# Patient Record
Sex: Female | Born: 1950 | Race: Black or African American | Hispanic: No | Smoking: Never smoker
Health system: Southern US, Community
[De-identification: ages and names within clinical notes are randomized; demographics above are authoritative.]

## PROBLEM LIST (undated history)

## (undated) DIAGNOSIS — K219 Gastro-esophageal reflux disease without esophagitis: Secondary | ICD-10-CM

## (undated) DIAGNOSIS — I10 Essential (primary) hypertension: Secondary | ICD-10-CM

## (undated) DIAGNOSIS — M1990 Unspecified osteoarthritis, unspecified site: Secondary | ICD-10-CM

## (undated) DIAGNOSIS — Z95828 Presence of other vascular implants and grafts: Secondary | ICD-10-CM

## (undated) DIAGNOSIS — Z973 Presence of spectacles and contact lenses: Secondary | ICD-10-CM

## (undated) DIAGNOSIS — I447 Left bundle-branch block, unspecified: Secondary | ICD-10-CM

## (undated) DIAGNOSIS — C801 Malignant (primary) neoplasm, unspecified: Secondary | ICD-10-CM

## (undated) DIAGNOSIS — Z923 Personal history of irradiation: Secondary | ICD-10-CM

## (undated) HISTORY — DX: Essential (primary) hypertension: I10

## (undated) HISTORY — PX: TUBAL LIGATION: SHX77

## (undated) HISTORY — PX: COLONOSCOPY W/ BIOPSIES AND POLYPECTOMY: SHX1376

---
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
## 2014-03-07 ENCOUNTER — Ambulatory Visit: Payer: Self-pay | Admitting: Orthopedic Surgery

## 2019-04-05 ENCOUNTER — Telehealth: Payer: Self-pay | Admitting: *Deleted

## 2019-04-05 NOTE — Telephone Encounter (Signed)
Oncology Nurse Navigator Documentation  Called Ms. Shave in follow-up to referral received from [facility].  She indicated she requested referral be sent to [facility] because of positive experience a friend had, was not aware she could receive tmt closer to home in [place].  I explained WebEx consult could be arranged with Dr. Isidore Moos who then could refer her to [facility] and/or [facility].  The consult wd provide her with a better understanding of tmt options/ locations.  She agreed to plan, voiced understanding she will be contacted to schedule appt.  Gayleen Orem, RN, BSN Head & Neck Oncology Nurse [place] at [place] [phone]

## 2019-04-07 ENCOUNTER — Telehealth: Payer: Self-pay | Admitting: *Deleted

## 2019-04-07 NOTE — Telephone Encounter (Addendum)
Oncology Nurse Navigator Documentation  In follow-up to our Tuesday conversation, called pt to confirm her understanding of 7/6 1:00 appt [facility] [place] with Dr. Tera Helper.  Unable to reach her at home phone, LVMM on mobile.  Encouraged her to call me with any questions.  Addendum 1722:  Rec'd call from [name], she confirmed understanding of appt.  Gayleen Orem, RN, BSN Head & Neck Oncology Nurse [place] at [place] [phone]

## 2019-04-07 NOTE — Telephone Encounter (Signed)
Thank you Liliane Channel.  I will see her on Monday with Dr. Raliegh Ip.

## 2019-04-11 ENCOUNTER — Inpatient Hospital Stay (HOSPITAL_COMMUNITY): Payer: Medicare Other | Attending: Hematology | Admitting: Hematology

## 2019-04-11 ENCOUNTER — Encounter (HOSPITAL_COMMUNITY): Payer: Self-pay | Admitting: Hematology

## 2019-04-11 ENCOUNTER — Other Ambulatory Visit: Payer: Self-pay

## 2019-04-11 VITALS — BP 152/87 | HR 95 | Temp 98.2°F | Resp 16 | Ht 65.0 in | Wt 208.1 lb

## 2019-04-11 DIAGNOSIS — C023 Malignant neoplasm of anterior two-thirds of tongue, part unspecified: Secondary | ICD-10-CM | POA: Diagnosis not present

## 2019-04-11 DIAGNOSIS — Z79899 Other long term (current) drug therapy: Secondary | ICD-10-CM

## 2019-04-11 DIAGNOSIS — I10 Essential (primary) hypertension: Secondary | ICD-10-CM

## 2019-04-11 DIAGNOSIS — C029 Malignant neoplasm of tongue, unspecified: Secondary | ICD-10-CM

## 2019-04-11 DIAGNOSIS — Z803 Family history of malignant neoplasm of breast: Secondary | ICD-10-CM | POA: Diagnosis not present

## 2019-04-11 MED ORDER — HYDROCODONE-ACETAMINOPHEN 5-325 MG PO TABS: 1.0000 | ORAL_TABLET | Freq: Four times a day (QID) | ORAL | 0 refills | Status: DC | PRN

## 2019-04-11 NOTE — Progress Notes (Signed)
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  HISTORY OF PRESENTING ILLNESS:  Terri [name] 68 y.o. female is seen in consultation today for further work-up and management of squamous cell carcinoma of the tongue.  She has noticed a lesion on the right lateral margin of the tongue last year, which was confirmed by her dentist.  She reported her tongue started hurting in the last week of May.  She was evaluated by her dentist again and was referred to Dr. Conley Simmonds at [facility].  A biopsy of the mass was done on 03/16/2019.  Since then she has been having more pain.  The tongue hurts while she is talking and swallowing.  She lost about 10 pounds since the biopsy.  She is able to eat soups, puddings and applesauce.  She is using lidocaine liquid for the tongue pain.  She also takes Tylenol as needed.  She was never smoker.  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  Family history significant for first cousins who had breast cancer.  No personal history of any malignancy.  She has high blood pressure for which she is on 2 different medications.  She also reports taking some herbal supplements.  Denies any other pains in the body.  Otherwise she is very healthy and is able to do all her ADLs and IADLs.  MEDICAL HISTORY:  Past Medical History:  Diagnosis Date  . Hypertension     SURGICAL HISTORY: History reviewed. No pertinent surgical history.  SOCIAL HISTORY: Social History   Socioeconomic History  . Marital status: Widowed    Spouse name: Not on file  . Number of children: Not on file  . Years of education: Not on file  . Highest education level: Not on file  Occupational History  . Not on file  Social Needs  . Financial resource strain: Not hard at all  . Food insecurity    Worry: Never true    Inability: Never true  .  Transportation needs    Medical: No    Non-medical: No  Tobacco Use  . Smoking status: Never Smoker  . Smokeless tobacco: Never Used  Substance and Sexual Activity  . Alcohol use: Never    Frequency: Never  . Drug use: Never  . Sexual activity: Not on file  Lifestyle  . Physical activity    Days per week: 0 days    Minutes per session: 0 min  . Stress: Only a little  Relationships  . Social connections    Talks on phone: More than three times a week    Gets together: More than three times a week    Attends religious service: More than 4 times per year    Active member of club or organization: Yes    Attends meetings of clubs or organizations: More than 4 times per year    Relationship status: Widowed  . Intimate partner violence    Fear of current or ex partner: No    Emotionally abused: No    Physically abused: No    Forced sexual activity: No  Other Topics Concern  . Not on file  Social History Narrative  . Not on file    FAMILY HISTORY: History reviewed. No pertinent family history.  ALLERGIES:  has no allergies on file.  MEDICATIONS:  Current Outpatient Medications  Medication Sig  Dispense Refill  . amLODipine (NORVASC) 5 MG tablet Take 5 mg by mouth daily.    . hydrochlorothiazide (HYDRODIURIL) 25 MG tablet Take 25 mg by mouth daily.    [name] HYDROcodone-acetaminophen (NORCO/VICODIN) 5-325 MG tablet Take 1 tablet by mouth every 6 (six) hours as needed for moderate pain. 30 tablet 0  . lidocaine (XYLOCAINE) 2 % solution USE 5ML EVERY 2 HOURS AS DIRECTED AS NEEDED    . omeprazole (PRILOSEC) 40 MG capsule TAKE 1 CAPSULE BY MOUTH EVERY DAY BEFORE BREAKFAST     No current facility-administered medications for this visit.     REVIEW OF SYSTEMS:   Constitutional: Denies fevers, chills or abnormal night sweats.  Positive for 10 pound weight loss in the last 4 weeks. Eyes: Denies blurriness of vision, double vision or watery eyes Ears, nose, mouth, throat, and face:  Denies mucositis or sore throat Respiratory: Denies cough, dyspnea or wheezes Cardiovascular: Denies palpitation, chest discomfort or lower extremity swelling Gastrointestinal:  Denies nausea, heartburn or change in bowel habits Skin: Denies abnormal skin rashes Lymphatics: Denies new lymphadenopathy or easy bruising Neurological:Denies numbness, tingling or new weaknesses Behavioral/Psych: Mood is stable, no new changes  All other systems were reviewed with the patient and are negative.  PHYSICAL EXAMINATION: ECOG PERFORMANCE STATUS: 0 - Asymptomatic  Vitals:   04/11/19 1200  BP: (!) 152/87  Pulse: 95  Resp: 16  Temp: 98.2 F (36.8 C)  SpO2: 99%   Filed Weights   04/11/19 1200  Weight: 208 lb 1 oz (94.4 kg)    GENERAL:alert, no distress and comfortable SKIN: skin color, texture, turgor are normal, no rashes or significant lesions EYES: normal, conjunctiva are pink and non-injected, sclera clear OROPHARYNX: Ulcerated lesion in the right lateral tongue measuring about 1 to 2 cm in size.  Area of induration surrounding it present.  Movements of the tongue are intact. NECK: supple, thyroid normal size, non-tender, without nodularity LYMPH:  no palpable lymphadenopathy in the cervical, axillary or inguinal LUNGS: clear to auscultation and percussion with normal breathing effort HEART: regular rate & rhythm and no murmurs and no lower extremity edema ABDOMEN:abdomen soft, non-tender and normal bowel sounds Musculoskeletal:no cyanosis of digits and no clubbing  PSYCH: alert & oriented x 3 with fluent speech NEURO: no focal motor/sensory deficits  LABORATORY DATA:  I have reviewed the data as listed No results found for: WBC, HGB, HCT, MCV, PLT   Chemistry   No results found for: NA, K, CL, CO2, BUN, CREATININE, GLU No results found for: CALCIUM, ALKPHOS, AST, ALT, BILITOT     RADIOGRAPHIC STUDIES: I have personally reviewed the radiological images as listed and agreed with  the findings in the report.  ASSESSMENT & PLAN:  Cancer of lateral margin of anterior two-thirds of tongue ([place]) 1.  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 - She started having pain in the right side of the tongue in the last week of May.  She was evaluated by her dentist again and was referred to Dr. Conley Simmonds at [facility]. -Biopsy of the right lateral tongue mass, showing moderately differentiated squamous cell carcinoma, P 16 stains are pending. - She reported 10 pound weight loss since biopsy on 03/16/2019. - Her swallowing ability is good.  However she has pain in the tongue when she is talking and swallowing.  She is able to eat soups, puddings and  applesauce at this time.  She is using lidocaine and Tylenol. -She was a never smoker. - Physical examination today shows right lateral tongue ulcer, measuring approximately 2 cm.  No palpable adenopathy in the neck region.  Clinical staging is T2/T3 N0. -I have discussed about the pathology report in detail.  I have recommended doing a PET CT scan for staging purposes. - We will also request nutrition consultation because of the weight loss.  We will also obtain consultation from speech-language pathology. - We will also make a referral to head and neck surgeon.  I will call and talk to Dr. Nicolette Bang at [facility]. - We will also obtain P 16 staining reports from [facility].  2.  Tongue pain: -She is experiencing pain on talking and swallowing.  She is not able to eat well because of the pain. -She lost about 10 pounds in the last 4 weeks. -She is using lidocaine liquid locally and Tylenol as needed. -I will send her prescription for hydrocodone 5/325 to be taken 30 to 60 minutes prior to eating.  Orders Placed This Encounter  Procedures  . NM PET Image Initial (PI) Skull Base To Thigh    Standing Status:   Future     Standing Expiration Date:   04/10/2020    Order Specific Question:   ** REASON FOR EXAM (FREE TEXT)    Answer:   tongue cancer    Order Specific Question:   If indicated for the ordered procedure, I authorize the administration of a radiopharmaceutical per Radiology protocol    Answer:   Yes    Order Specific Question:   Preferred imaging location?    Answer:   [facility]    Order Specific Question:   Radiology Contrast Protocol - do NOT remove file path    Answer:   [email or web address]  . Ambulatory referral to Speech Therapy    Referral Priority:   Routine    Referral Type:   Speech Therapy    Referral Reason:   Specialty Services Required    Requested Specialty:   Speech Pathology    Number of Visits Requested:   1   Total time spent is 60 minutes more than 50% of the time spent face-to-face discussing diagnosis, further work-up, counseling and coordination of care.  All questions were answered. The patient knows to call the clinic with any problems, questions or concerns.      Derek Jack, MD 04/11/2019 2:05 PM

## 2019-04-11 NOTE — Addendum Note (Signed)
Addended by: Farley Ly on: 04/11/2019 04:00 PM   Modules accepted: Orders

## 2019-04-11 NOTE — Assessment & Plan Note (Addendum)
1.  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 - She started having pain in the right side of the tongue in the last week of May.  She was evaluated by her dentist again and was referred to Dr. Conley Simmonds at [facility]. -Biopsy of the right lateral tongue mass, showing moderately differentiated squamous cell carcinoma, P 16 stains are pending. - She reported 10 pound weight loss since biopsy on 03/16/2019. - Her swallowing ability is good.  However she has pain in the tongue when she is talking and swallowing.  She is able to eat soups, puddings and applesauce at this time.  She is using lidocaine and Tylenol. -She was a never smoker. - Physical examination today shows right lateral tongue ulcer, measuring approximately 2 cm.  No palpable adenopathy in the neck region.  Clinical staging is T2/T3 N0. -I have discussed about the pathology report in detail.  I have recommended doing a PET CT scan for staging purposes. - We will also request nutrition consultation because of the weight loss.  We will also obtain consultation from speech-language pathology. - We will also make a referral to head and neck surgeon.  I will call and talk to Dr. Nicolette Bang at [facility]. - We will also obtain P 16 staining reports from [facility].  2.  Tongue pain: -She is experiencing pain on talking and swallowing.  She is not able to eat well because of the pain. -She lost about 10 pounds in the last 4 weeks. -She is using lidocaine liquid locally and Tylenol as needed. -I will send her prescription for hydrocodone 5/325 to be taken 30 to 60 minutes prior to eating.

## 2019-04-11 NOTE — Patient Instructions (Signed)
[place] at [facility] Discharge Instructions  You were seen today by Dr. Delton Coombes. He went over your history, family history and how you've been feeling right now. He will get you scheduled for a PET scan, a nutrition consult, a swallow study and refer you to a head and neck surgeon. He will see you back after your scan for follow up.   Thank you for choosing [place] at [facility] to provide your oncology and hematology care.  To afford each patient quality time with our provider, please arrive at least 15 minutes before your scheduled appointment time.   If you have a lab appointment with the [place] please come in thru the  Main Entrance and check in at the main information desk  You need to re-schedule your appointment should you arrive 10 or more minutes late.  We strive to give you quality time with our providers, and arriving late affects you and other patients whose appointments are after yours.  Also, if you no show three or more times for appointments you may be dismissed from the clinic at the providers discretion.     Again, thank you for choosing [facility].  Our hope is that these requests will decrease the amount of time that you wait before being seen by our physicians.       _____________________________________________________________  Should you have questions after your visit to [facility], please contact our office at [phone] between the hours of 8:00 a.m. and 4:30 p.m.  Voicemails left after 4:00 p.m. will not be returned until the following business day.  For prescription refill requests, have your pharmacy contact our office and allow 72 hours.    Cancer Center Support Programs:   > Cancer Support Group  2nd Tuesday of the month 1pm-2pm, Journey Room

## 2019-04-14 ENCOUNTER — Telehealth: Payer: Self-pay | Admitting: *Deleted

## 2019-04-14 NOTE — Telephone Encounter (Signed)
Oncology Nurse Navigator Documentation  Rec'd call from Ms. Winfield, answered her questions re upcoming PET and CT scans.  Gayleen Orem, RN, BSN Head & Neck Oncology Nurse [place] at [place] [phone]

## 2019-04-15 ENCOUNTER — Ambulatory Visit (HOSPITAL_COMMUNITY)
Admission: RE | Admit: 2019-04-15 | Discharge: 2019-04-15 | Disposition: A | Discharge status: Home / Self Care | Payer: BC Managed Care – PPO | Source: Ambulatory Visit | Attending: Hematology | Admitting: Hematology

## 2019-04-15 ENCOUNTER — Other Ambulatory Visit: Payer: Self-pay

## 2019-04-15 DIAGNOSIS — C029 Malignant neoplasm of tongue, unspecified: Secondary | ICD-10-CM | POA: Diagnosis not present

## 2019-04-15 LAB — GLUCOSE, CAPILLARY: Glucose-Capillary: 88 mg/dL (ref 70–99)

## 2019-04-15 MED ORDER — FLUDEOXYGLUCOSE F - 18 (FDG) INJECTION
10.3700 | Freq: Once | INTRAVENOUS | Status: AC | PRN
  Administered 2019-04-15: 10.37 via INTRAVENOUS

## 2019-04-18 ENCOUNTER — Ambulatory Visit (HOSPITAL_COMMUNITY)
Admission: RE | Admit: 2019-04-18 | Discharge: 2019-04-18 | Disposition: A | Discharge status: Home / Self Care | Payer: BC Managed Care – PPO | Source: Ambulatory Visit | Attending: Hematology | Admitting: Hematology

## 2019-04-18 ENCOUNTER — Other Ambulatory Visit: Payer: Self-pay

## 2019-04-18 DIAGNOSIS — C029 Malignant neoplasm of tongue, unspecified: Secondary | ICD-10-CM | POA: Diagnosis not present

## 2019-04-18 LAB — POCT I-STAT CREATININE: Creatinine, Ser: 1.3 mg/dL — ABNORMAL HIGH (ref 0.44–1.00)

## 2019-04-18 MED ORDER — IOHEXOL 300 MG/ML  SOLN
75.0000 mL | Freq: Once | INTRAMUSCULAR | Status: AC | PRN
  Administered 2019-04-18: 75 mL via INTRAVENOUS

## 2019-04-19 ENCOUNTER — Encounter (HOSPITAL_COMMUNITY): Payer: Self-pay | Admitting: Hematology

## 2019-04-19 ENCOUNTER — Inpatient Hospital Stay (HOSPITAL_COMMUNITY): Payer: Medicare Other | Admitting: Dietician

## 2019-04-19 ENCOUNTER — Inpatient Hospital Stay (HOSPITAL_BASED_OUTPATIENT_CLINIC_OR_DEPARTMENT_OTHER): Payer: Medicare Other | Admitting: Hematology

## 2019-04-19 DIAGNOSIS — Z803 Family history of malignant neoplasm of breast: Secondary | ICD-10-CM

## 2019-04-19 DIAGNOSIS — I10 Essential (primary) hypertension: Secondary | ICD-10-CM | POA: Diagnosis not present

## 2019-04-19 DIAGNOSIS — Z79899 Other long term (current) drug therapy: Secondary | ICD-10-CM

## 2019-04-19 DIAGNOSIS — C023 Malignant neoplasm of anterior two-thirds of tongue, part unspecified: Secondary | ICD-10-CM

## 2019-04-19 NOTE — Assessment & Plan Note (Signed)
1.  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  She started having pain in the right side of the tongue in the last week of May.  She was evaluated by her dentist today and was referred to Dr. Conley Simmonds at [facility]. -Biopsy of the right lateral tongue mass, showing moderately differentiated squamous cell carcinoma, P 16 stains are pending. -She reported 10 pound weight loss since biopsy on 03/16/2019. - Physical examination today revealed right lateral tongue ulcer, extending into the ventral surface measuring approximately 3 cm.  No palpable adenopathy in the neck region. - CT of the neck with contrast on 04/18/2019 shows large oval area of hyperenhancement at the right oral tongue with mass-effect encompassing 35 x 14 x 25 mm.  This is inseparable from the body of the right mandible articular erosion is identified.  No associated adenopathy. -PET CT scan on 04/15/2019 shows focal hypermetabolism at the right tongue consistent with known neoplasm.  No evidence of hypermetabolic metastatic disease in the neck, chest, abdomen or pelvis. -I have talked to Dr. Nicolette Bang at [facility] for surgical resection.  She has an appointment to see him end of this month. -We will also obtain P 16 staining reports from [facility].  I will see her back in 4 weeks after surgery.  2.  Tongue pain: -She does have baseline pain which gets worse with talking and eating. - She has lost about 10 pounds since her biopsy on 03/16/2019.  She has used liquid lidocaine which did not help. - I have given a prescription for hydrocodone 5/325, which she is taking at bedtime.  I have told her to take half a tablet during daytime as needed to prevent excessive drowsiness.  She was also told to take stool softener with it.  3.  Weight loss: - I have made a consultation with our dietitian for weight loss.

## 2019-04-19 NOTE — Progress Notes (Signed)
Nutrition Assessment  Reason for Assessment: New Head/Neck cancer pt  ASSESSMENT:  68 y/o female PMHx of HTN. Developed lesion on R lateral margin of tongue last year. Her dentist referred her to [facility]. S/p Biopsy 6/10. + moderately differentiated SCC. Referred to [facility] for treatment  There is no chart history on patient predating her referral to [facility]. She has essentially no other health issues.   Today, pt states her tongue mass really was not giving her any trouble prior to her biopsy. After her biopsy, the lesion became painful and started to impact her ability to eat - d/t pain. The pain is not quite as bad with liquids, so she has been mainly consuming soups, applesauce, pudding and ensure (~1 a day). She was prescribed pain medication to help, but she is still working and is appropriately hesitant to take the opiate meds during the day as it may affect her job performance.   She denies any other issues. No n/v/C/d or trouble swallowing; she does say she hasnt been having BMs as frequently, but relates this to her decreased solid intake. She takes OTC softeners as needed.   She takes a MVI in addition to several herbal supplements. Her son is apparently a naturopathic doctor and has her taking several supplements, such as one specifically "for my lymph nodes". She says the son provides her with special protein drinks too.   She estimates she has lost 10-20 lbs in the last month. She is 207 lbs today and was just over 2018 at her initial consultation 1 week ago. She voices some happiness with the weight loss.   Per MD, patient is being referred for sugery at [facility]. Tentatively, pt will not need any adjuvant chemo or radiation.   Wt Readings from Last 10 Encounters:  04/11/19 208 lb 1 oz (94.4 kg)   MEDICATIONS:  Chemo: N/A Supportive: Hydrocodone Other: HCTZ, ppi  LABS:  Recent Labs  Lab 04/18/19 1910  CREATININE 1.30*   ANTHROPOMETRICS: Height:  Ht Readings from Last 1  Encounters:  04/11/19 5\' 5"  (1.651 m)   Weight:  Wt Readings from Last 1 Encounters:  04/11/19 208 lb 1 oz (94.4 kg)   BMI:  BMI Readings from Last 1 Encounters:  04/11/19 34.62 kg/m   UBW: Didn't ask IBW: 56.82 kg  ESTIMATED ENERGY NEEDS:  Kcal: 1800-2000 kcals (19-21 kcal/kg bw) Protein: 80-90g Pro (1.4-1.6g/kg ibw) Fluid: 1.8-2 L fluid  NUTRITION DIAGNOSIS:  Trouble chewing/swallowing related to tongue lesion causing pain w/ chewing/swallowing as evidenced by pts report of 10 lb weight loss x1 month.   DOCUMENTATION CODES:  Obesity, unspecified  INTERVENTION:  RD reviewed importance of nutrition both pre and post op. While some weight loss would be beneficial to her health, losing it in her current manner is not recommended. Specifically, RD noted how her applesauces and puddings arent providing her much protein. Depending on which ones she consumes, she may not be receiving much protein from her soups either.   RD gave patient handout titled "Soft and Moist High Protein Menu Ideas". Recommended items like creamy soups, eggs, PB etc. She says she is trying to avoid all solids d/t pain, including PB. Being she will be following a [place] diet more or less, RD recommended oral supplements. She is already receiving protein drinks from son, though RD not familiar with the ones he gives her. RD gave her a case of Ensure Enlive and recommended drinking 2/day. Also provided Boost coupons.   Alternatively, RD recommended unflavored  protein powder. Directed her to add small   Amounts to liquids- should add small enough amount that she does not notice it is there and thereby not altering her foods. This may only add 20-30 kcals and a few grams of protein, but this will add up over time.  Patient has surgical consultation in 2 weeks. Unknown date of surgery. RD gave her alternate [facility] RDs number and directed her to call if she experiences continued weight loss or is in need of further Ensure.     GOAL:  Oral intake to meet >90% of needs, wt stability   MONITOR:  Level of oral intake, weight, labs, surgery date  Next Visit: As needed  Burtis Junes RD, LDN, CNSC Clinical Nutrition Available Tues-Sat via Pager: [phone] 04/19/2019 10:15 AM

## 2019-04-19 NOTE — Patient Instructions (Addendum)
[place] at [facility] Discharge Instructions  You were seen today by Dr. Delton Coombes. He went over your recent scan results. He will see you back in 6 weeks for follow up.   Thank you for choosing [place] at [facility] to provide your oncology and hematology care.  To afford each patient quality time with our provider, please arrive at least 15 minutes before your scheduled appointment time.   If you have a lab appointment with the [place] please come in thru the  Main Entrance and check in at the main information desk  You need to re-schedule your appointment should you arrive 10 or more minutes late.  We strive to give you quality time with our providers, and arriving late affects you and other patients whose appointments are after yours.  Also, if you no show three or more times for appointments you may be dismissed from the clinic at the providers discretion.     Again, thank you for choosing [facility].  Our hope is that these requests will decrease the amount of time that you wait before being seen by our physicians.       _____________________________________________________________  Should you have questions after your visit to [facility], please contact our office at [phone] between the hours of 8:00 a.m. and 4:30 p.m.  Voicemails left after 4:00 p.m. will not be returned until the following business day.  For prescription refill requests, have your pharmacy contact our office and allow 72 hours.    Cancer Center Support Programs:   > Cancer Support Group  2nd Tuesday of the month 1pm-2pm, Journey Room

## 2019-04-19 NOTE — Progress Notes (Signed)
[place], [place] [address]   CLINIC:  Medical Oncology/Hematology  PCP:  [name], DO [address] [name] [address] [phone]   REASON FOR VISIT:  Follow-up for right tongue cancer.     INTERVAL HISTORY:  Terri [name] 68 y.o. 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  She is taking hydrocodone at bedtime.  She is continuing to have some baseline pain which gets worse upon talking and while eating.  She lost about 10 pounds since biopsy.  She does report some decrease in appetite and mild constipation.    REVIEW OF SYSTEMS:  Review of Systems  HENT:   Positive for trouble swallowing.   Gastrointestinal: Positive for constipation.  Neurological: Positive for dizziness.  Psychiatric/Behavioral: Positive for sleep disturbance.  All other systems reviewed and are negative.    PAST MEDICAL/SURGICAL HISTORY:  Past Medical History:  Diagnosis Date  . Hypertension    History reviewed. No pertinent surgical history.   SOCIAL HISTORY:  Social History   Socioeconomic History  . Marital status: Widowed    Spouse name: Not on file  . Number of children: Not on file  . Years of education: Not on file  . Highest education level: Not on file  Occupational History  . Not on file  Social Needs  . Financial resource strain: Not hard at all  . Food insecurity    Worry: Never true    Inability: Never true  . Transportation needs    Medical: No    Non-medical: No  Tobacco Use  . Smoking status: Never Smoker  . Smokeless tobacco: Never Used  Substance and Sexual Activity  . Alcohol use: Never    Frequency: Never  . Drug use: Never  . Sexual activity: Not on file  Lifestyle  . Physical activity    Days per week: 0 days    Minutes per session: 0 min  . Stress: Only a little  Relationships  . Social connections    Talks on phone: More than three times a week    Gets  together: More than three times a week    Attends religious service: More than 4 times per year    Active member of club or organization: Yes    Attends meetings of clubs or organizations: More than 4 times per year    Relationship status: Widowed  . Intimate partner violence    Fear of current or ex partner: No    Emotionally abused: No    Physically abused: No    Forced sexual activity: No  Other Topics Concern  . Not on file  Social History Narrative  . Not on file    FAMILY HISTORY:  History reviewed. No pertinent family history.  CURRENT MEDICATIONS:  Outpatient Encounter Medications as of 04/19/2019  Medication Sig  . amLODipine (NORVASC) 5 MG tablet Take 5 mg by mouth daily.  . diclofenac (VOLTAREN) 75 MG EC tablet Take 75 mg by mouth 2 (two) times daily with a meal.  . hydrochlorothiazide (HYDRODIURIL) 25 MG tablet Take 25 mg by mouth daily.  [name] HYDROcodone-acetaminophen (NORCO/VICODIN) 5-325 MG tablet Take 1 tablet by mouth every 6 (six) hours as needed for moderate pain.  [name] lidocaine (XYLOCAINE) 2 % solution USE 5ML EVERY 2 HOURS AS DIRECTED AS NEEDED  . omeprazole (PRILOSEC) 40 MG capsule TAKE 1 CAPSULE BY MOUTH EVERY DAY BEFORE BREAKFAST   No facility-administered  encounter medications on file as of 04/19/2019.     ALLERGIES:  No Known Allergies   PHYSICAL EXAM:  ECOG Performance status: 0  Vitals:   04/19/19 1042  BP: (!) 152/64  Pulse: 85  Resp: 16  Temp: (!) 97.5 F (36.4 C)  SpO2: 100%   Filed Weights   04/19/19 1042  Weight: 207 lb 8 oz (94.1 kg)    Physical Exam Vitals signs reviewed.  Constitutional:      Appearance: Normal appearance.  HENT:     Mouth/Throat:     Comments: Right lateral tongue mass measuring 3 to 4 cm. Cardiovascular:     Rate and Rhythm: Normal rate and regular rhythm.     Heart sounds: Normal heart sounds.  Pulmonary:     Effort: Pulmonary effort is normal.     Breath sounds: Normal breath sounds.  Abdominal:      General: There is no distension.     Palpations: Abdomen is soft. There is no mass.  Musculoskeletal:        General: No swelling.  Lymphadenopathy:     Cervical: No cervical adenopathy.  Skin:    General: Skin is warm.  Neurological:     General: No focal deficit present.     Mental Status: She is alert and oriented to person, place, and time.  Psychiatric:        Mood and Affect: Mood normal.        Behavior: Behavior normal.      LABORATORY DATA:  I have reviewed the labs as listed.  CBC No results found for: WBC, RBC, HGB, HCT, PLT, MCV, MCH, MCHC, RDW, LYMPHSABS, MONOABS, EOSABS, BASOSABS CMP Latest Ref Rng & Units 04/18/2019  Creatinine 0.44 - 1.00 mg/dL 1.30(H)       DIAGNOSTIC IMAGING:  I have independently reviewed the scans and discussed with the patient.   I have reviewed Terri Lick LPN's note and agree with the documentation.  I personally performed a face-to-face visit, made revisions and my assessment and plan is as follows.    ASSESSMENT & PLAN:   Cancer of lateral margin of anterior two-thirds of tongue ([place]) 1.  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  She started having pain in the right side of the tongue in the last week of May.  She was evaluated by her dentist today and was referred to Terri [name] at [facility]. -Biopsy of the right lateral tongue mass, showing moderately differentiated squamous cell carcinoma, P 16 stains are pending. -She reported 10 pound weight loss since biopsy on 03/16/2019. - Physical examination today revealed right lateral tongue ulcer, extending into the ventral surface measuring approximately 3 cm.  No palpable adenopathy in the neck region. - CT of the neck with contrast on 04/18/2019 shows large oval area of hyperenhancement at the right oral tongue with mass-effect encompassing 35 x 14 x 25 mm.  This is  inseparable from the body of the right mandible articular erosion is identified.  No associated adenopathy. -PET CT scan on 04/15/2019 shows focal hypermetabolism at the right tongue consistent with known neoplasm.  No evidence of hypermetabolic metastatic disease in the neck, chest, abdomen or pelvis. -I have talked to Dr. Nicolette Bang at [facility] for surgical resection.  She has an appointment to see him end of this month. -We will also obtain P 16  staining reports from [facility].  I will see her back in 4 weeks after surgery.  2.  Tongue pain: -She does have baseline pain which gets worse with talking and eating. - She has lost about 10 pounds since her biopsy on 03/16/2019.  She has used liquid lidocaine which did not help. - I have given a prescription for hydrocodone 5/325, which she is taking at bedtime.  I have told her to take half a tablet during daytime as needed to prevent excessive drowsiness.  She was also told to take stool softener with it.  3.  Weight loss: - I have made a consultation with our dietitian for weight loss.  Total time spent is 25 minutes with more than 50% of the time spent face-to-face discussing scan results, management plan, counseling and coordination of care.    Orders placed this encounter:  No orders of the defined types were placed in this encounter.     Derek Jack, MD [place] [phone]

## 2019-04-25 ENCOUNTER — Other Ambulatory Visit: Payer: Self-pay

## 2019-04-25 ENCOUNTER — Ambulatory Visit (HOSPITAL_COMMUNITY): Payer: BC Managed Care – PPO | Attending: Hematology | Admitting: Speech Pathology

## 2019-04-25 ENCOUNTER — Encounter (HOSPITAL_COMMUNITY): Payer: Self-pay | Admitting: Speech Pathology

## 2019-04-25 DIAGNOSIS — R1311 Dysphagia, oral phase: Secondary | ICD-10-CM | POA: Diagnosis not present

## 2019-04-25 NOTE — Therapy (Signed)
Terri [name] [place], [name], [address] Phone: [phone]   Fax:  [phone]  Speech Language Pathology Evaluation/Clinical Swallow Evaluation  Patient Details  Name: Terri [name] MRN: [id number] Date of Birth: Mar 14, 1951 No data recorded  Encounter Date: 04/25/2019  End of Session - 04/25/19 1117    Visit Number  1    Number of Visits  1    Authorization Type  [employer] PPO    SLP Start Time  0912    SLP Stop Time   1000    SLP Time Calculation (min)  48 min    Activity Tolerance  Patient tolerated treatment well       Past Medical History:  Diagnosis Date  . Hypertension     History reviewed. No pertinent surgical history.  There were no vitals filed for this visit.  Subjective Assessment - 04/25/19 1043    Subjective  "It just hurts to eat and drink because of my tongue."    Currently in Pain?  Yes    Pain Score  6     Pain Location  Other (Comment)   R tongue        Prior Functional Status - 04/25/19 1053      Prior Functional Status   Cognitive/Linguistic Baseline  Within functional limits    Type of Home  House      General - 04/25/19 1053      General Information   Date of Onset  03/16/19    HPI  Terri [name] is a 68 yo female who was referred for a clinical swallow evaluation by Dr. 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 Pt underwent biopsy on 03/16/19 and CT neck with contrast on 04/18/19 shows large oval area hyperenhancement at the right oral tongue with mass-effect encompassing 35 x 14 x 25 mm.  This is inseparable from the body of the right mandible articular erosion is identified.  No associated adenopathy. PET CT scan on 04/15/2019 shows focal hypermetabolism at the right tongue consistent with known neoplasm.  No evidence of hypermetabolic metastatic disease in the neck, chest, abdomen or pelvis. Pt has an appointment with  Dr. Nicolette Bang at [facility] for surgical resection consultation on 05/04/2019. Pt c/o significant odynophagia with swallowing and has a reported weight loss of 10+ pounds. She uses liquid lidocaine topically with a Q-tip before meals.    Type of Study  Bedside Swallow Evaluation    Diet Prior to this Study  Regular;Thin liquids    Temperature Spikes Noted  No    Respiratory Status  Room air    History of Recent Intubation  No    Behavior/Cognition  Alert;Cooperative;Pleasant mood    Oral Cavity Assessment  Edema    Oral Care Completed by SLP  No    Oral Cavity - Dentition  Adequate natural dentition    Vision  Functional for self-feeding    Self-Feeding Abilities  Able to feed self    Patient Positioning  Upright in chair    Baseline Vocal Quality  Normal    Volitional Cough  Strong    Volitional Swallow  Able to elicit       Oral Motor/Sensory Function - 04/25/19 1106      Oral Motor/Sensory Function   Overall Oral Motor/Sensory Function  Within functional limits      Ice Chips - 04/25/19 1106      Ice  Chips   Ice chips  Not tested      Thin Liquid - 04/25/19 1106      Thin Liquid   Thin Liquid  Within functional limits    Presentation  Cup;Self Fed;Straw      Nectar thick liquid - 04/25/19 1114      Nectar Thick Liquid   Nectar Thick Liquid  Not tested      Honey Thick Liquid - 04/25/19 1114      Honey Thick Liquid   Honey Thick Liquid  Not tested      Puree - 04/25/19 1114      Puree   Puree  Within functional limits    Presentation  Self Fed;Spoon    Other Comments  slow oral prep due to odynophagia      Solid - 04/25/19 1115      Solid   Solid  Not tested          SLP Education - 04/25/19 1049    Education Details  Provided list of soft foods, aspiration precautions, and contact information for follow up pending course of treatment    Person(s) Educated  Patient    Methods  Explanation;Handout    Comprehension  Verbalized  understanding       SLP Short Term Goals - 04/25/19 1120      SLP SHORT TERM GOAL #1   Title  N/A; evaluation only         Plan - 04/25/19 1118    Clinical Impression Statement  Clinical swallow evaluation was completed as part of [facility] protocol. Pt presents with mild oral dysphagia due to significant right lateral tongue pain s/p biopsy. She is experiencing some swelling at the site and right ear discomfort. Pt without overt signs or symptoms of aspiration or pharyngeal dysphagia at this time. She consumes solids with reduced rotary mastication and reduced lingual movement due to pain. Pt reports using liquid lidocaine locally, applied with a Q-tip before po intake. She is also placing gauze between her teeth and tongue at night to mitigate pain. She has not used wax on her teeth and reports "managing" with mostly full liquids at this time. SLP emphasized the importance of maintaining hydration needs. She was given a list of soft food ideas and suggested that she consider purchasing an immersion blender to achieve desired consistencies. No further SLP services indicated at this time. Pt reports that her treatment will only be surgical intervention and that she will not have chemo radiation. Pt will see Dr. Nicolette Bang next week. Pt was given my contact information should she have further questions or needs for SLP treatment following surgical intervention.    Treatment/Interventions  Patient/family education;SLP instruction and feedback    Potential to Achieve Goals  Good    SLP Home Exercise Plan  N/A    Consulted and Agree with Plan of Care  Patient       Patient will benefit from skilled therapeutic intervention in order to improve the following deficits and impairments:   1. Dysphagia, oral phase       Problem List Patient Active Problem List   Diagnosis Date Noted  . Cancer of lateral margin of anterior two-thirds of tongue [facility]) 04/11/2019   Thank you,  Genene Churn,  [place]  [facility] 04/25/2019, 11:21 AM  [place] [address] [place], [name], [address] Phone: [phone]   Fax:  [phone]  Name: Terri [name] MRN: [id number] Date of Birth:  01/06/1951 

## 2019-04-28 ENCOUNTER — Other Ambulatory Visit (HOSPITAL_COMMUNITY): Payer: Self-pay | Admitting: *Deleted

## 2019-04-28 DIAGNOSIS — C023 Malignant neoplasm of anterior two-thirds of tongue, part unspecified: Secondary | ICD-10-CM

## 2019-04-28 MED ORDER — LIDOCAINE VISCOUS HCL 2 % MT SOLN: OROMUCOSAL | 1 refills | Status: DC

## 2019-04-28 MED ORDER — HYDROCODONE-ACETAMINOPHEN 5-325 MG PO TABS: 1.0000 | ORAL_TABLET | Freq: Four times a day (QID) | ORAL | 0 refills | Status: DC | PRN

## 2019-05-13 ENCOUNTER — Other Ambulatory Visit: Payer: Self-pay | Admitting: Radiation Oncology

## 2019-05-13 HISTORY — PX: NECK DISSECTION: SUR422

## 2019-05-26 ENCOUNTER — Encounter: Payer: Self-pay | Admitting: Radiation Oncology

## 2019-05-26 ENCOUNTER — Telehealth: Payer: Self-pay | Admitting: *Deleted

## 2019-05-26 NOTE — Progress Notes (Signed)
Head and Neck Cancer Location of Tumor / Histology:  03/16/19  05/13/19 FINAL PATHOLOGIC DIAGNOSIS MICROSCOPIC EXAMINATION AND DIAGNOSIS A. SUPERIOR/DEEP MARGIN, BIOPSY: Benign squamous mucosa and skeletal muscle. Negative for malignancy. B. RIGHT TONGUE, PARTIAL GLOSSECTOMY: Invasive squamous cell carcinoma, moderately to poorly differentiated. Greatest dimension 4.2 cm. Depth of invasion 17 mm. Perineural invasion present. Margins uninvolved by carcinoma (closest: dorsal, 2 mm). See CAP protocol. C. RIGHT SUBMANDIBULAR CONTENTS, DISSECTION: No carcinoma in two lymph nodes (0/2). Salivary gland with no diagnostic abnormality. D. SUBMENTAL CONTENTS, DISSECTION: No carcinoma in four lymph nodes (0/4). E. RIGHT NECK LEVELS 2A & 3, DISSECTION: Metastatic carcinoma in two of ten lymph nodes (2/10). Greatest dimension of metastasis 0.5 cm. Extranodal extension present, 1 mm from lymph node capsule. F. RIGHT NECK LEVELS 2B, BIOPSY: No carcinoma in one lymph node (0/1).  Patient presented with symptoms of: Right sided tongue pain in the last week of May.   Biopsies of right ventral tongue revealed: moderately well-differentiated squamous cell carcinoma.   Nutrition Status Yes No Comments  Weight changes? [x]  []  She reports losing about 30 lbs since early June due to pain to her tongue.   Swallowing concerns? []  [x]    PEG? []  [x]     Referrals Yes No Comments  Social Work? []  [x]    Dentistry? []  [x]    Swallowing therapy? []  [x]    Nutrition? []  [x]    Med/Onc? [x]  []  Dr. Delton Coombes 05/30/19   Safety Issues Yes No Comments  Prior radiation? []  [x]    Pacemaker/ICD? []  [x]    Possible current pregnancy? []  [x]    Is the patient on methotrexate? []  [x]     Tobacco/Marijuana/Snuff/ETOH use: She does not drink alcohol. She has never smoked.   Past/Anticipated interventions by otolaryngology, if any:   05/25/19 Dr. Nicolette Bang Impression  Right lateral tongue squamous cell carcinoma, s/p Excision and right neck dissection on 05/13/19. Pathology with 4.2 cm tumor, 17 mm DOI, negative margins (44mm), +PNI, no LVI, 2/17 nodes (5 mm, +ECS 1 mm) She appears to be healing appropriately  Diet: advance as tolerated Wound care measures were taught to the patient.  We will get her back with Dr Delton Coombes to discuss adjuvant chemoRT.  I will see her back in ~3 weeks.  She is agreeable with this plan. All her questions were answered   Past/Anticipated interventions by medical oncology, if any:  05/30/19 Dr. Delton Coombes 1.  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 Nicolette Bang at [facility] -Pathology shows 4.2 cm tumor, margins negative, 17 mm depth of invasion, moderately to poorly differentiated, positive neural invasion, 2/17 lymph nodes positive, ECE positive, pT4a, PN 3B -We will obtain P 16 staining results from [facility]. - We discussed the pathology report in detail.  She has adverse features including extracapsular extension, positive perineural invasion, T4 primary and N3 disease. - I have recommended systemic therapy with RT which is a category 1 recommendation. - If she is not a candidate for high-dose cisplatin, we will consider weekly cisplatin.  Will obtain baseline creatinine and other labs today. -She will need a port placement.  We will also make follow-up visits with dietary and speech/swallow evaluation. -She has a consultation with Dr. Isidore Moos tomorrow.  2.  Weight loss: - She lost 10 pounds since biopsy on 03/16/2019.  This was mostly secondary to pain.   Current Complaints / other details:

## 2019-05-26 NOTE — Telephone Encounter (Signed)
Oncology Nurse Navigator Documentation  Spoke with Terri [name] in follow-up to referral from [facility] Dr. Nicolette Bang. She confirmed understanding of referral to [facility] for adjuvant XRT, confirmed she will be receiving chemotherapy at [facility]. She stated understanding of 6 weeks M-F XRT, indicated willing to travel from home in [place], [place] (about 45 min drive), but wd like to discuss option of being treated at [facility] in [place].  She voiced understanding she will be contacted to schedule appt with Dr. Isidore Moos. I provide my phone #, encourage her to call me with questions.  Gayleen Orem, RN, BSN Head & Neck Oncology Nurse [place] at [place] [phone]

## 2019-05-30 ENCOUNTER — Encounter (HOSPITAL_COMMUNITY): Payer: Self-pay | Admitting: Hematology

## 2019-05-30 ENCOUNTER — Telehealth: Payer: Self-pay | Admitting: Radiation Oncology

## 2019-05-30 ENCOUNTER — Inpatient Hospital Stay (HOSPITAL_COMMUNITY): Payer: BC Managed Care – PPO | Attending: Hematology | Admitting: Hematology

## 2019-05-30 ENCOUNTER — Other Ambulatory Visit: Payer: Self-pay

## 2019-05-30 DIAGNOSIS — Z791 Long term (current) use of non-steroidal anti-inflammatories (NSAID): Secondary | ICD-10-CM | POA: Diagnosis not present

## 2019-05-30 DIAGNOSIS — I10 Essential (primary) hypertension: Secondary | ICD-10-CM | POA: Diagnosis not present

## 2019-05-30 DIAGNOSIS — C023 Malignant neoplasm of anterior two-thirds of tongue, part unspecified: Secondary | ICD-10-CM | POA: Insufficient documentation

## 2019-05-30 DIAGNOSIS — R634 Abnormal weight loss: Secondary | ICD-10-CM | POA: Diagnosis not present

## 2019-05-30 DIAGNOSIS — Z79899 Other long term (current) drug therapy: Secondary | ICD-10-CM | POA: Diagnosis not present

## 2019-05-30 DIAGNOSIS — K5900 Constipation, unspecified: Secondary | ICD-10-CM | POA: Diagnosis not present

## 2019-05-30 LAB — COMPREHENSIVE METABOLIC PANEL
ALT: 41 U/L (ref 0–44)
AST: 40 U/L (ref 15–41)
Albumin: 4.1 g/dL (ref 3.5–5.0)
Alkaline Phosphatase: 54 U/L (ref 38–126)
Anion gap: 12 (ref 5–15)
BUN: 11 mg/dL (ref 8–23)
CO2: 22 mmol/L (ref 22–32)
Calcium: 9.4 mg/dL (ref 8.9–10.3)
Chloride: 101 mmol/L (ref 98–111)
Creatinine, Ser: 1.19 mg/dL — ABNORMAL HIGH (ref 0.44–1.00)
GFR calc Af Amer: 54 mL/min — ABNORMAL LOW (ref >60)
GFR calc non Af Amer: 47 mL/min — ABNORMAL LOW (ref >60)
Glucose, Bld: 93 mg/dL (ref 70–99)
Potassium: 3.8 mmol/L (ref 3.5–5.1)
Sodium: 135 mmol/L (ref 135–145)
Total Bilirubin: 1.2 mg/dL (ref 0.3–1.2)
Total Protein: 7.8 g/dL (ref 6.5–8.1)

## 2019-05-30 LAB — CBC WITH DIFFERENTIAL/PLATELET
Abs Immature Granulocytes: 0.01 10*3/uL (ref 0.00–0.07)
Basophils Absolute: 0.1 10*3/uL (ref 0.0–0.1)
Basophils Relative: 1 %
Eosinophils Absolute: 0.2 10*3/uL (ref 0.0–0.5)
Eosinophils Relative: 4 %
HCT: 45.6 % (ref 36.0–46.0)
Hemoglobin: 14.7 g/dL (ref 12.0–15.0)
Immature Granulocytes: 0 %
Lymphocytes Relative: 30 %
Lymphs Abs: 1.4 10*3/uL (ref 0.7–4.0)
MCH: 27.3 pg (ref 26.0–34.0)
MCHC: 32.2 g/dL (ref 30.0–36.0)
MCV: 84.8 fL (ref 80.0–100.0)
Monocytes Absolute: 0.5 10*3/uL (ref 0.1–1.0)
Monocytes Relative: 10 %
Neutro Abs: 2.5 10*3/uL (ref 1.7–7.7)
Neutrophils Relative %: 55 %
Platelets: 317 10*3/uL (ref 150–400)
RBC: 5.38 MIL/uL — ABNORMAL HIGH (ref 3.87–5.11)
RDW: 13.5 % (ref 11.5–15.5)
WBC: 4.6 10*3/uL (ref 4.0–10.5)
nRBC: 0 % (ref 0.0–0.2)

## 2019-05-30 NOTE — Assessment & Plan Note (Addendum)
1.  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 Nicolette Bang at [facility] -Pathology shows 4.2 cm tumor, margins negative, 17 mm depth of invasion, moderately to poorly differentiated, positive neural invasion, 2/17 lymph nodes positive, ECE positive, pT4a, PN 3B -We will obtain P 16 staining results from [facility]. - We discussed the pathology report in detail.  She has adverse features including extracapsular extension, positive perineural invasion, T4 primary and N3 disease. - I have recommended systemic therapy with RT which is a category 1 recommendation. - If she is not a candidate for high-dose cisplatin, we will consider weekly cisplatin.  Will obtain baseline creatinine and other labs today. -She will need a port placement.  We will also make follow-up visits with dietary and speech/swallow evaluation. -She has a consultation with Dr. Isidore Moos tomorrow.  2.  Weight loss: - She lost 10 pounds since biopsy on 03/16/2019.  This was mostly secondary to pain.  3.  Hypertension: -She is on Norvasc 5 mg daily and hydrochlorothiazide 25 mg daily. -We will consider discontinuing hydrochlorothiazide once we start cisplatin.  We will consider increasing Norvasc at that time. -I have told her to discontinue NSAIDs.

## 2019-05-30 NOTE — Patient Instructions (Addendum)
[place] at [facility] Discharge Instructions  You were seen today by Dr. Delton Coombes. He went over your recent test results. He is recommending you have chemotherapy as well as radiation therapy. He will schedule you for a port placement as well as an appointment for speech therapy. He will see you back once you have been seen by radiation for follow up.   Thank you for choosing [place] at [facility] to provide your oncology and hematology care.  To afford each patient quality time with our provider, please arrive at least 15 minutes before your scheduled appointment time.   If you have a lab appointment with the [facility] please come in thru the  Main Entrance and check in at the main information desk  You need to re-schedule your appointment should you arrive 10 or more minutes late.  We strive to give you quality time with our providers, and arriving late affects you and other patients whose appointments are after yours.  Also, if you no show three or more times for appointments you may be dismissed from the clinic at the providers discretion.     Again, thank you for choosing [facility].  Our hope is that these requests will decrease the amount of time that you wait before being seen by our physicians.       _____________________________________________________________  Should you have questions after your visit to [facility], please contact our office at [phone] between the hours of 8:00 a.m. and 4:30 p.m.  Voicemails left after 4:00 p.m. will not be returned until the following business day.  For prescription refill requests, have your pharmacy contact our office and allow 72 hours.    Cancer Center Support Programs:   > Cancer Support Group  2nd Tuesday of the month 1pm-2pm, Journey Room

## 2019-05-30 NOTE — Progress Notes (Signed)
Radiation Oncology         [phone] ________________________________  Initial outpatient Consultation by WebEx due to pandemic precautions  Name: Terri [name] MRN: [id number]  Date: 05/31/2019  DOB: May 13, 1951  QV:[id number], Morley Kos, DO  Francina Ames, MD  REFERRING PHYSICIAN: Francina Ames, MD  DIAGNOSIS:    ICD-10-CM   1. 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 female who presented to her dentist with a lesion on the right lateral margin of her tongue last year. She began to experience pain to her tongue in the last week of 02/2019 and was referred by her dentist to Dr. Conley Simmonds at [facility]. Biopsy of right ventral tongue on 03/16/2019 revealed: moderately well-differentiated squamous cell carcinoma.  She was seen by Dr. Delton Coombes on 04/11/2019, who ordered staging scans. PET scan performed 04/15/2019 showed: focal hypermetabolism at the right tongue consistent with known neoplasm; no evidence for hypermetabolic metastatic disease in the neck, chest, abdomen, or pelvis; tiny focus of hypermetabolism in the right mandibular body without underlying bony lesion evident, may be related to dentition.  Neck CT performed on 04/18/2019 showed: hyperenhancing right oral tongue mass estimated at 35 mm, inseparable from the body of the right mandible but no evidence of bone involvement or metastasis; no associated lymphadenopathy, no metastatic disease identified in the neck.  Subsequently, the patient saw Dr. Nicolette Bang on 05/04/2019  who performed excision of right lateral tongue and right neck dissection on 05/13/2019. Pathology from the procedure showed: tumor size of 4.2 cm, negative margins, perineural invasion present; out of 17 biopsied lymph nodes, 2 were positive (2/17), extranodal extension present.  She last saw Dr. Delton Coombes yesterday, 05/30/2019, who recommended systemic therapy with radiation therapy. She is currently scheduled with: Dr. Enrique Sack in dentistry on 06/01/2019; Jennet Maduro, RD in nutrition on 06/03/2019; Dr. Nicolette Bang on 06/15/2019.  Swallowing issues, if any:  Nutrition Status Yes No Comments  Weight changes? [x]  []  She reports losing about 30 lbs since early June due to pain to her tongue.   Swallowing concerns? []  [x]    PEG? []  [x]     Referrals Yes No Comments  Social Work? []  [x]    Dentistry? []  [x]    Swallowing therapy? []  [x]    Nutrition? []  [x]    Med/Onc? [x]  []  Dr. Delton Coombes 05/30/19   Safety Issues Yes No Comments  Prior radiation? []  [x]    Pacemaker/ICD? []  [x]    Possible current pregnancy? []  [x]    Is the patient on methotrexate? []  [x]     Pain status: no pain  Tobacco history, if any: none  ETOH abuse, if any: none  Prior cancers, if any: none  Swallowing well, tolerating some solids, speaking well  She works full-time - right now she works from home   She is involved with her church  PREVIOUS RADIATION THERAPY: No  PAST MEDICAL HISTORY:  has a past medical history of Hypertension.    PAST SURGICAL HISTORY: Past Surgical History:  Procedure Laterality Date  . NECK DISSECTION  05/13/2019   Right lateral tongue squamous cell  carcinoma, s/p Excision and right neck dissection on 05/13/19. Dr. Nicolette Bang at [facility]  . TUBAL LIGATION      FAMILY HISTORY: family history includes Heart failure in her father and mother.  SOCIAL HISTORY:  reports that she has never smoked. She has never used smokeless tobacco. She reports that she does not drink alcohol or use drugs.  ALLERGIES:  Patient has no known allergies.  MEDICATIONS:  Current Outpatient Medications  Medication Sig Dispense Refill  . acetaminophen (TYLENOL) 500 MG tablet Take 500 mg by mouth every 8 (eight) hours as needed (pain).     [name] amLODipine (NORVASC) 5 MG tablet Take 5 mg by mouth daily.    . diclofenac (VOLTAREN) 75 MG EC tablet Take 75 mg by mouth 2 (two) times daily as needed (pain).     . hydrochlorothiazide (HYDRODIURIL) 25 MG tablet Take 25 mg by mouth daily.    [name] omeprazole (PRILOSEC) 40 MG capsule Take 40 mg by mouth daily.     . chlorhexidine (PERIDEX) 0.12 % solution Use as directed 15 mLs in the mouth or throat 2 (two) times daily. 120 mL 2  . Multiple Vitamins-Minerals (MULTIVITAMIN WITH MINERALS) tablet Take 1 tablet by mouth daily.    . sodium fluoride (PREVIDENT 5000 PLUS) 1.1 % CREA dental cream Apply to tooth brush. Brush teeth for 2 minutes. Spit out excess-DO NOT swallow. DO NOT rinse afterwards. Repeat nightly. 2 Tube prn   No current facility-administered medications for this encounter.     REVIEW OF SYSTEMS:  Notable for that above.   PHYSICAL EXAM:  vitals were not taken for this visit.   General: Alert and oriented, in no acute distress Neuro: fluent speech, A+O Psychiatric: Judgment and insight are intact. Affect is appropriate.    LABORATORY DATA:  Lab Results  Component Value Date   WBC 4.6 05/30/2019   HGB 14.7 05/30/2019   HCT 45.6 05/30/2019   MCV 84.8 05/30/2019   PLT 317 05/30/2019   CMP     Component Value Date/Time   NA 135 05/30/2019 1649   K 3.8 05/30/2019 1649   CL 101 05/30/2019 1649   CO2 22 05/30/2019 1649   GLUCOSE 93 05/30/2019 1649   BUN 11 05/30/2019 1649   CREATININE 1.19 (H) 05/30/2019 1649   CALCIUM 9.4 05/30/2019 1649   PROT 7.8 05/30/2019 1649   ALBUMIN 4.1 05/30/2019 1649   AST 40 05/30/2019 1649   ALT 41 05/30/2019 1649   ALKPHOS 54 05/30/2019 1649   BILITOT 1.2 05/30/2019 1649   GFRNONAA 47 (L) 05/30/2019 1649   GFRAA 54 (L)  05/30/2019 1649      No results found for: TSH   RADIOGRAPHY:  As above    IMPRESSION/PLAN: Locally advanced tongue cancer, with perineural invasion, extracapsular extension, negative margins, negative LVSI  This is a delightful patient with head and neck cancer. I do recommend radiotherapy for this patient.  Anticipate concurrent systemic therapy.  Although the patient lives in [place] she would like to drive down here for her treatment.  She anticipates 6 weeks of treatment.  We discussed the potential risks, benefits, and side effects of radiotherapy. We talked in detail about acute and late effects. We discussed that some of the most bothersome acute effects may be mucositis, dysgeusia, salivary changes, skin irritation, hair loss, dehydration, weight loss and fatigue. We talked about late effects which include but are not necessarily limited to dysphagia, hypothyroidism, nerve injury, spinal cord injury, xerostomia, trismus, dental Randa Lynn  injury, and neck edema. No guarantees of treatment were given. I look forward to participating in the patient's care.    Simulation (treatment planning) will take place after clearance from dentistry  We also discussed that the treatment of head and neck cancer is a multidisciplinary process to maximize treatment outcomes and quality of life. For this reason the following referrals have been or will be made:   Medical oncology to discuss chemotherapy    Dentistry for dental evaluation, possible extractions in the radiation fields, and /or advice on reducing risk of cavities, osteoradionecrosis, or other oral issues.   Nutritionist for nutrition support during and after treatment.   Speech language pathology for swallowing and/or speech therapy.   Social work for social support.    Physical therapy due to risk of lymphedema in neck and deconditioning.   Baseline labs including TSH.  This encounter was provided by telemedicine platform  Webex.  The patient has given verbal consent for this type of encounter and has been advised to only accept a meeting of this type in a secure network environment. The time spent during this encounter was 30 minutes. The attendants for this meeting include Eppie Gibson  and Gearldine Shown.  During the encounter, Eppie Gibson was located at [facility] Radiation Oncology Department.  Janifer Gieselman was located at home.   __________________________________________   Eppie Gibson, MD  This document serves as a record of services personally performed by Eppie Gibson, MD. It was created on her behalf by Wilburn Mylar, a trained medical scribe. The creation of this record is based on the scribe's personal observations and the provider's statements to them. This document has been checked and approved by the attending provider.

## 2019-05-30 NOTE — Progress Notes (Signed)
Terri [name], [place] [address]   CLINIC:  Medical Oncology/Hematology  PCP:  [name], DO [address] [name] [address] [phone]   REASON FOR VISIT:  Follow-up for right tongue cancer.     INTERVAL HISTORY:  Terri [name] 68 y.o. female seen for follow-up of right tongue squamous cell carcinoma.  She underwent partial glossectomy right neck dissection on 05/13/2019 by Dr.Waltonen at [facility].  She reports that the pain has completely improved.  She is able to eat soft foods.  Swallowing ability has improved since surgery.  She does report some constipation which is stable.  She is accompanied by her sister today.  Appetite is 25%.  Energy levels are 75%.  Denies any tingling or numbness in extremities.    REVIEW OF SYSTEMS:  Review of Systems  Gastrointestinal: Positive for constipation.  All other systems reviewed and are negative.    PAST MEDICAL/SURGICAL HISTORY:  Past Medical History:  Diagnosis Date  . Hypertension    Past Surgical History:  Procedure Laterality Date  . 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 Dr. Nicolette Bang at [facility]:  Social History   Socioeconomic History  . Marital status: Widowed    Spouse name: Not on file  . Number of children: Not on file  . Years of education: Not on file  . Highest education level: Not on file  Occupational History  . Not on file  Social Needs  . Financial resource strain: Not hard at all  . Food insecurity    Worry: Never true    Inability: Never true  . Transportation needs    Medical: No    Non-medical: No  Tobacco Use  . Smoking status: Never Smoker  . Smokeless tobacco: Never Used  Substance and Sexual Activity  . Alcohol use: Never    Frequency: Never  . Drug use: Never  . Sexual activity: Not on file  Lifestyle  . Physical activity   Days per week: 0 days    Minutes per session: 0 min  . Stress: Only a little  Relationships  . Social connections    Talks on phone: More than three times a week    Gets together: More than three times a week    Attends religious service: More than 4 times per year    Active member of club or organization: Yes    Attends meetings of clubs or organizations: More than 4 times per year    Relationship status: Widowed  . Intimate partner violence    Fear of current or ex partner: No    Emotionally abused: No    Physically abused: No    Forced sexual activity: No  Other Topics Concern  . Not on file  Social History Narrative  . Not on file    FAMILY HISTORY:  No family history on file.  CURRENT MEDICATIONS:  Outpatient Encounter Medications as of 05/30/2019  Medication Sig  . acetaminophen (TYLENOL) 500 MG tablet Take 500 mg by mouth as needed.   [name] amLODipine (NORVASC) 5 MG tablet Take 5 mg by mouth daily.  . chlorhexidine (PERIDEX) 0.12 % solution Use as directed 15 mLs in the mouth or throat 3 times daily for 14 days.  . hydrochlorothiazide (HYDRODIURIL) 25 MG tablet Take 25 mg by mouth daily.  [name] omeprazole (PRILOSEC) 40  MG capsule TAKE 1 CAPSULE BY MOUTH EVERY DAY BEFORE BREAKFAST  . diclofenac (VOLTAREN) 75 MG EC tablet Take 75 mg by mouth as needed.   [name] HYDROcodone-acetaminophen (NORCO/VICODIN) 5-325 MG tablet Take 1 tablet by mouth every 6 (six) hours as needed for moderate pain. (Patient not taking: Reported on 05/30/2019)  . lidocaine (XYLOCAINE) 2 % solution USE 5ML EVERY 2 HOURS AS DIRECTED AS NEEDED (Patient not taking: Reported on 05/30/2019)   No facility-administered encounter medications on file as of 05/30/2019.     ALLERGIES:  No Known Allergies   PHYSICAL EXAM:  ECOG Performance status: 0  Vitals:   05/30/19 1605  BP: (!) 156/74  Pulse: 99  Resp: 18  Temp: (!) 97.5 F (36.4 C)  SpO2: 97%   Filed Weights   05/30/19 1605  Weight: 189 lb 14.4 oz (86.1  kg)    Physical Exam Vitals signs reviewed.  Constitutional:      Appearance: Normal appearance.  HENT:     Mouth/Throat:     Comments: Right lateral tongue is well-healed.  Sutures in place. Cardiovascular:     Rate and Rhythm: Normal rate and regular rhythm.     Heart sounds: Normal heart sounds.  Pulmonary:     Effort: Pulmonary effort is normal.     Breath sounds: Normal breath sounds.  Abdominal:     General: There is no distension.     Palpations: Abdomen is soft. There is no mass.  Musculoskeletal:        General: No swelling.  Lymphadenopathy:     Cervical: No cervical adenopathy.  Skin:    General: Skin is warm.  Neurological:     General: No focal deficit present.     Mental Status: She is alert and oriented to person, place, and time.  Psychiatric:        Mood and Affect: Mood normal.        Behavior: Behavior normal.   Neck incision site is also well-healed.   LABORATORY DATA:  I have reviewed the labs as listed.  CBC No results found for: WBC, RBC, HGB, HCT, PLT, MCV, MCH, MCHC, RDW, LYMPHSABS, MONOABS, EOSABS, BASOSABS CMP Latest Ref Rng & Units 04/18/2019  Creatinine 0.44 - 1.00 mg/dL 1.30(H)       DIAGNOSTIC IMAGING:  I have independently reviewed the scans and discussed with the patient.   I have reviewed Terri Lick LPN's note and agree with the documentation.  I personally performed a face-to-face visit, made revisions and my assessment and plan is as follows.    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 Nicolette Bang at [facility] -Pathology shows 4.2 cm tumor, margins negative, 17 mm depth of invasion, moderately to poorly differentiated, positive neural invasion, 2/17 lymph nodes positive, ECE positive, pT4a, PN 3B -We will  obtain P 16 staining results from [facility]. - We discussed the pathology report in detail.  She has adverse features including extracapsular extension, positive perineural invasion, T4 primary and N3 disease. - I have recommended systemic therapy with RT which is a category 1 recommendation. - If she is not a candidate for high-dose cisplatin, we will consider weekly cisplatin.  Will obtain baseline creatinine and other labs today. -She will need a port placement.  We will also make follow-up visits  with dietary and speech/swallow evaluation. -She has a consultation with Dr. Isidore Moos tomorrow.  2.  Weight loss: - She lost 10 pounds since biopsy on 03/16/2019.  This was mostly secondary to pain.  3.  Hypertension: -She is on Norvasc 5 mg daily and hydrochlorothiazide 25 mg daily. -We will consider discontinuing hydrochlorothiazide once we start cisplatin.  We will consider increasing Norvasc at that time. -I have told her to discontinue NSAIDs.  Total time spent is 40 minutes with more than 50% of the time spent face-to-face discussing pathology report, management plan, counseling and coordination of care.    Orders placed this encounter:  Orders Placed This Encounter  Procedures  . Comprehensive metabolic panel  . CBC with Differential/Platelet  . Ambulatory referral to [facility], [employer] [phone]

## 2019-05-31 ENCOUNTER — Other Ambulatory Visit: Payer: Self-pay

## 2019-05-31 ENCOUNTER — Encounter: Payer: Self-pay | Admitting: Radiation Oncology

## 2019-05-31 ENCOUNTER — Ambulatory Visit
Admission: RE | Admit: 2019-05-31 | Discharge: 2019-05-31 | Disposition: A | Discharge status: Home / Self Care | Payer: BC Managed Care – PPO | Source: Ambulatory Visit | Attending: Radiation Oncology | Admitting: Radiation Oncology

## 2019-05-31 DIAGNOSIS — C023 Malignant neoplasm of anterior two-thirds of tongue, part unspecified: Secondary | ICD-10-CM

## 2019-06-01 ENCOUNTER — Other Ambulatory Visit: Payer: Self-pay | Admitting: Radiation Oncology

## 2019-06-01 ENCOUNTER — Other Ambulatory Visit: Payer: Self-pay

## 2019-06-01 ENCOUNTER — Encounter (HOSPITAL_COMMUNITY): Payer: Self-pay | Admitting: Dentistry

## 2019-06-01 ENCOUNTER — Ambulatory Visit (HOSPITAL_COMMUNITY): Payer: Self-pay | Admitting: Dentistry

## 2019-06-01 VITALS — BP 151/73 | HR 80 | Temp 98.3°F

## 2019-06-01 DIAGNOSIS — Z1329 Encounter for screening for other suspected endocrine disorder: Secondary | ICD-10-CM

## 2019-06-01 DIAGNOSIS — C023 Malignant neoplasm of anterior two-thirds of tongue, part unspecified: Secondary | ICD-10-CM

## 2019-06-01 DIAGNOSIS — K0530 Chronic periodontitis, unspecified: Secondary | ICD-10-CM

## 2019-06-01 DIAGNOSIS — K06010 Localized gingival recession, unspecified: Secondary | ICD-10-CM

## 2019-06-01 DIAGNOSIS — K036 Deposits [accretions] on teeth: Secondary | ICD-10-CM

## 2019-06-01 DIAGNOSIS — C021 Malignant neoplasm of border of tongue: Secondary | ICD-10-CM | POA: Diagnosis not present

## 2019-06-01 DIAGNOSIS — K011 Impacted teeth: Secondary | ICD-10-CM

## 2019-06-01 DIAGNOSIS — K0850 Unsatisfactory restoration of tooth, unspecified: Secondary | ICD-10-CM

## 2019-06-01 DIAGNOSIS — M2632 Excessive spacing of fully erupted teeth: Secondary | ICD-10-CM

## 2019-06-01 DIAGNOSIS — Z01818 Encounter for other preprocedural examination: Secondary | ICD-10-CM

## 2019-06-01 DIAGNOSIS — M264 Malocclusion, unspecified: Secondary | ICD-10-CM

## 2019-06-01 DIAGNOSIS — K08409 Partial loss of teeth, unspecified cause, unspecified class: Secondary | ICD-10-CM

## 2019-06-01 DIAGNOSIS — R634 Abnormal weight loss: Secondary | ICD-10-CM

## 2019-06-01 MED ORDER — SODIUM FLUORIDE 1.1 % DT CREA: TOPICAL_CREAM | DENTAL | 99 refills | Status: DC

## 2019-06-01 NOTE — Progress Notes (Signed)
DENTAL CONSULTATION  Date of Consultation:  06/01/2019 Patient Name:   Terri [name] Date of Birth:   [birth year] Medical Record Number: [id number]  COVID 19 SCREENING: The patient does not symptoms concerning for COVID-19 infection (Including fever, chills, cough, or new SHORTNESS OF BREATH).    VITALS: BP (!) 151/73 (BP Location: Right Arm)   Pulse 80   Temp 98.3 F (36.8 C)   CHIEF COMPLAINT: Patient referred by Dr. Isidore Moos for dental consultation.  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  Patient with anticipated chemoradiation therapy.  Patient is now seen as part of a medically necessary pre-chemoradiation therapy dental protocol examination.  The patient currently denies acute toothaches, swellings, or abscesses.  Patient last saw her primary dentist, Dr. Amalia Hailey, in [name] for an examination of her tongue on 03/07/2019.  Patient was subsequent referred to Dr. Conley Simmonds, oral surgeon in [place], for evaluation and biopsy on 03/16/2019.  Biopsy results indicated there was cancer of the right lateral tongue.  The patient was then seen by Dr. Nicolette Bang and underwent right partial glossectomy with neck dissection on 05/13/2019.  Pathology indicated there was squamous cell carcinoma with 2+ lymph nodes, perineural invasion, and extranodal extension noted.  Patient with anticipated chemoradiation therapy.  Patient was last seen for a dental cleaning in January 2020.  Patient is usually seen on an every 29-month basis.  The patient denies having any partial dentures.  The patient denies having dental phobia.  PROBLEM LIST: Patient Active Problem List   Diagnosis Date Noted  . Cancer of lateral margin of anterior two-thirds of tongue ([place]) 04/11/2019    Priority: High    PMH: Past Medical History:  Diagnosis Date  . Hypertension     PSH: Past Surgical History:  Procedure Laterality Date  . NECK  DISSECTION  05/13/2019   Right lateral tongue squamous cell carcinoma, s/p Excision and right neck dissection on 05/13/19. Dr. Nicolette Bang at [facility]  . TUBAL LIGATION      ALLERGIES: No Known Allergies  MEDICATIONS: Current Outpatient Medications  Medication Sig Dispense Refill  . acetaminophen (TYLENOL) 500 MG tablet Take 500 mg by mouth as needed.     [name] amLODipine (NORVASC) 5 MG tablet Take 5 mg by mouth daily.    . diclofenac (VOLTAREN) 75 MG EC tablet Take 75 mg by mouth as needed.     . hydrochlorothiazide (HYDRODIURIL) 25 MG tablet Take 25 mg by mouth daily.    [name] HYDROcodone-acetaminophen (NORCO/VICODIN) 5-325 MG tablet Take 1 tablet by mouth every 6 (six) hours as needed for moderate pain. (Patient not taking: Reported on 05/30/2019) 30 tablet 0  . lidocaine (XYLOCAINE) 2 % solution USE 5ML EVERY 2 HOURS AS DIRECTED AS NEEDED (Patient not taking: Reported on 05/30/2019) 240 mL 1  . omeprazole (PRILOSEC) 40 MG capsule TAKE 1 CAPSULE BY MOUTH EVERY DAY BEFORE BREAKFAST     No current facility-administered medications for this visit.     LABS: Lab Results  Component Value Date   WBC 4.6 05/30/2019   HGB 14.7 05/30/2019   HCT 45.6 05/30/2019   MCV 84.8 05/30/2019   PLT 317 05/30/2019      Component Value Date/Time   NA 135 05/30/2019 1649   K 3.8 05/30/2019 1649   CL 101 05/30/2019 1649   CO2 22 05/30/2019 1649   GLUCOSE 93 05/30/2019 1649   BUN 11 05/30/2019 1649   CREATININE 1.19 (H) 05/30/2019  1649   CALCIUM 9.4 05/30/2019 1649   GFRNONAA 47 (L) 05/30/2019 1649   GFRAA 54 (L) 05/30/2019 1649   No results found for: INR, PROTIME No results found for: PTT  SOCIAL HISTORY: Social History   Socioeconomic History  . Marital status: Widowed    Spouse name: Not on file  . Number of children: 2  . Years of education: Not on file  . Highest education level: Not on file  Occupational History  . Not on file  Social Needs  . Financial resource strain: Not hard at all  .  Food insecurity    Worry: Never true    Inability: Never true  . Transportation needs    Medical: No    Non-medical: No  Tobacco Use  . Smoking status: Never Smoker  . Smokeless tobacco: Never Used  Substance and Sexual Activity  . Alcohol use: Never    Frequency: Never  . Drug use: Never  . Sexual activity: Not on file  Lifestyle  . Physical activity    Days per week: 0 days    Minutes per session: 0 min  . Stress: Only a little  Relationships  . Social connections    Talks on phone: More than three times a week    Gets together: More than three times a week    Attends religious service: More than 4 times per year    Active member of club or organization: Yes    Attends meetings of clubs or organizations: More than 4 times per year    Relationship status: Widowed  . Intimate partner violence    Fear of current or ex partner: No    Emotionally abused: No    Physically abused: No    Forced sexual activity: No  Other Topics Concern  . Not on file  Social History Narrative      Patient is a widow.   Patient lives with Her 2 sons in [place], [place].   Patient is a non-smoker, nondrinker.  Patient has never used chew.  Patient does not use illicit drugs.    FAMILY HISTORY: Family History  Problem Relation Age of Onset  . Heart failure Mother   . Heart failure Father     REVIEW OF SYSTEMS: Reviewed with the patient as per History of present illness. Psych: Patient denies having dental phobia.  DENTAL HISTORY: CHIEF COMPLAINT: Patient referred by Dr. Isidore Moos for dental consultation.  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  Patient with anticipated chemoradiation therapy.  Patient is now seen as part of a medically necessary pre-chemoradiation therapy dental protocol examination.  The patient currently denies acute toothaches, swellings, or abscesses.  Patient last saw her primary  dentist, Dr. Amalia Hailey, in [name] for an examination of her tongue on 03/07/2019.  Patient was subsequent referred to Dr. Conley Simmonds, oral surgeon in [place], for evaluation and biopsy on 03/16/2019.  Biopsy results indicated there was cancer of the right lateral tongue.  The patient was then seen by Dr. Nicolette Bang and underwent right partial glossectomy with neck dissection on 05/13/2019.  Pathology indicated there was squamous cell carcinoma with 2+ lymph nodes, perineural invasion, and extranodal extension noted.  Patient with anticipated chemoradiation therapy.  Patient was last seen for a dental cleaning in January 2020.  Patient is usually seen on an every 19-month basis.  The patient denies having any partial dentures.  The patient denies having dental  phobia.  DENTAL EXAMINATION: GENERAL: The patient is a well-developed, well-nourished female no acute distress. HEAD AND NECK: The right neck is consistent with previous neck dissection.  I do not palpate any left neck lymphadenopathy.  The patient denies having any acute TMJ symptoms. INTRAORAL EXAM: Patient has normal saliva.  The right lateral tongue is consistent with previous partial glossectomy.  The patient has a exostoses in the area of tooth numbers 1 through 3 on the palatal aspect. DENTITION: Patient is missing tooth numbers 14, 16, and 17 through 20.  Tooth #32 is impacted.  Multiple diastemas are noted. PERIODONTAL: Patient has chronic periodontitis with plaque and calculus accumulations, selective areas of gingival recession, and no significant tooth mobility.  There is incipient a moderate bone loss noted. DENTAL CARIES/SUBOPTIMAL RESTORATIONS: No obvious dental caries are noted.  There are several suboptimal dental restorations. ENDODONTIC: Patient currently denies acute pulpitis symptoms.  There is no evidence of periapical radiolucency or pathology. CROWN AND BRIDGE: There are no crown restorations noted.  Patient could be  evaluated for future crown and bridge with her primary dentist if patient so desired.   PROSTHODONTIC: Patient denies having partial dentures. OCCLUSION: Patient has a poor occlusal scheme secondary to multiple missing teeth, multiple diastemas, supra eruption and drifting of the unopposed teeth into the edentulous areas, and lack of replacement of the missing teeth with dental prostheses.  RADIOGRAPHIC INTERPRETATION: An orthopantogram was taken and supplemented with upper lower periapical radiographs and 4 bitewings.  Patient would not tolerate lower right periapical radiographs in the posterior areas. There are multiple missing teeth.  There is full bony impacted #32.  There is incipient to moderate bone loss noted.  Multiple diastemas are noted.  Multiple dental restorations are noted.  There is supraeruption and drifting of the unopposed teeth into the edentulous areas.  ASSESSMENTS: 1.  Squamous cell carcinoma of the right lateral tongue 2.  Status post right partial glossectomy with right neck dissection on 05/13/2019 by Dr. Nicolette Bang 3.  Anticipated postoperative chemoradiation therapy 4.  Chronic periodontitis with bone loss 5.  Gingival recession 6.  Accretions 7.  Multiple missing teeth 8.  Impacted tooth #32 9.  Multiple diastemas 10.  No history of partial dentures 11.  Poor occlusal scheme and malocclusion 12.  Maxillary right palatal exostoses 13.  Multiple suboptimal dental restorations   PLAN/RECOMMENDATIONS: 1. I discussed the risks, benefits, and complications of various treatment options with the patient in relationship to her medical and dental conditions, anticipated chemoradiation therapy, and chemoradiation therapy side effects to include xerostomia, radiation caries, trismus, mucositis, taste changes, gum and jawbone changes, and risk for infection and osteoradionecrosis. We discussed various treatment options to include no treatment, multiple extractions with  alveoloplasty, pre-prosthetic surgery as indicated, periodontal therapy, dental restorations, root canal therapy, crown and bridge therapy, implant therapy, and replacement of missing teeth as indicated.  We also discussed referral to an oral surgeon for a second opinion concerning extraction of tooth numbers 1, 28, 29, 30, 31, impacted tooth #32.  The patient currently is ADAMANT about not wanting any dental extractions at this time.  The patient also refused referral to an oral surgeon for second opinion concerning the extraction of teeth at this time.  Patient did agree to proceed with fabrication of fluoride trays and radiation cone locator/tongue positioner.  Impressions of the upper and lower arches was made today and patient tolerated the procedure well.  A prescription for fluoride therapy was sent to her [facility]  in [place] with refills for one year.  The patient will return to Dental Medicine on September 1st for continued fabrication of the tongue positioner/radiation cone locator.  Patient will then be referred back to Dr. Isidore Moos for simulation appointment.    2. Discussion of findings with medical team and coordination of future medical and dental care as needed.  I spent in excess of  120 minutes during the conduct of this consultation and >50% of this time involved direct face-to-face encounter for counseling and/or coordination of the patient's care.    Lenn Cal, DDS

## 2019-06-01 NOTE — Progress Notes (Signed)
Dental Form with Estimates of Radiation Dose     Diagnosis:    ICD-10-CM   1. 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)  C02.3     Prognosis: fair  Anticipated # of fractions: 30    Daily?: yes  # of weeks of radiotherapy: 6  Chemotherapy?: possible  Anticipated xerostomia:  Mild permanent  Pre-simulation needs:  Tongue positioner    Simulation: ASAP    Other Notes: 50-60Gy to right mandibular teeth, should be able to spare left side. Please contact Eppie Gibson, MD, with patient's disposition after evaluation and/or dental treatment.

## 2019-06-01 NOTE — Patient Instructions (Signed)

## 2019-06-02 ENCOUNTER — Encounter: Payer: Self-pay | Admitting: General Surgery

## 2019-06-02 ENCOUNTER — Ambulatory Visit: Payer: BC Managed Care – PPO | Admitting: General Surgery

## 2019-06-02 ENCOUNTER — Other Ambulatory Visit (HOSPITAL_COMMUNITY): Payer: Self-pay | Admitting: *Deleted

## 2019-06-02 ENCOUNTER — Encounter (HOSPITAL_COMMUNITY): Payer: Self-pay | Admitting: Dentistry

## 2019-06-02 VITALS — BP 134/80 | HR 85 | Temp 97.8°F | Resp 16 | Ht 65.0 in | Wt 189.0 lb

## 2019-06-02 DIAGNOSIS — C021 Malignant neoplasm of border of tongue: Secondary | ICD-10-CM | POA: Diagnosis not present

## 2019-06-02 MED ORDER — CHLORHEXIDINE GLUCONATE 0.12 % MT SOLN: 15.0000 mL | Freq: Two times a day (BID) | OROMUCOSAL | 2 refills | Status: DC

## 2019-06-02 NOTE — Progress Notes (Signed)
Terri [name]; [id number]; July 09, 1951   HPI Patient is a 68 year old black female who was referred to my care by Dr. Delton Coombes for Port-A-Cath insertion.  She was recently diagnosed with squamous cell carcinoma of the tongue and is about to undergo chemotherapy and radiation therapy.  She currently has 0 out of 10 pain. Past Medical History:  Diagnosis Date  . Hypertension     Past Surgical History:  Procedure Laterality Date  . 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 Dr. Nicolette Bang at [facility]  . TUBAL LIGATION      Family History  Problem Relation Age of Onset  . Heart failure Mother   . Heart failure Father     Current Outpatient Medications on File Prior to Visit  Medication Sig Dispense Refill  . acetaminophen (TYLENOL) 500 MG tablet Take 500 mg by mouth as needed.     [name] amLODipine (NORVASC) 5 MG tablet Take 5 mg by mouth daily.    . diclofenac (VOLTAREN) 75 MG EC tablet Take 75 mg by mouth as needed.     . hydrochlorothiazide (HYDRODIURIL) 25 MG tablet Take 25 mg by mouth daily.    [name] lidocaine (XYLOCAINE) 2 % solution USE 5ML EVERY 2 HOURS AS DIRECTED AS NEEDED 240 mL 1  . omeprazole (PRILOSEC) 40 MG capsule TAKE 1 CAPSULE BY MOUTH EVERY DAY BEFORE BREAKFAST    . sodium fluoride (PREVIDENT 5000 PLUS) 1.1 % CREA dental cream Apply to tooth brush. Brush teeth for 2 minutes. Spit out excess-DO NOT swallow. DO NOT rinse afterwards. Repeat nightly. 2 Tube prn  . HYDROcodone-acetaminophen (NORCO/VICODIN) 5-325 MG tablet Take 1 tablet by mouth every 6 (six) hours as needed for moderate pain. (Patient not taking: Reported on 06/02/2019) 30 tablet 0   No current facility-administered medications on file prior to visit.     No Known Allergies  Social History   Substance and Sexual Activity  Alcohol Use Never  . Frequency: Never    Social History   Tobacco Use  Smoking Status Never Smoker   Smokeless Tobacco Never Used    Review of Systems  Constitutional: Negative.   HENT: Negative.   Eyes: Negative.   Respiratory: Negative.   Cardiovascular: Negative.   Gastrointestinal: Negative.   Genitourinary: Negative.   Musculoskeletal: Negative.   Skin: Negative.   Neurological: Negative.   Endo/Heme/Allergies: Negative.   Psychiatric/Behavioral: Negative.     Objective   Vitals:   06/02/19 1008  BP: 134/80  Pulse: 85  Resp: 16  Temp: 97.8 F (36.6 C)  SpO2: 97%    Physical Exam Vitals signs reviewed.  Constitutional:      Appearance: Normal appearance. She is normal weight. She is not ill-appearing.  HENT:     Head: Normocephalic and atraumatic.  Cardiovascular:     Rate and Rhythm: Normal rate and regular rhythm.     Heart sounds: Normal heart sounds. No murmur. No friction rub. No gallop.   Pulmonary:     Effort: Pulmonary effort is normal. No respiratory distress.     Breath sounds: Normal breath sounds. No stridor. No wheezing, rhonchi or rales.  Skin:    General: Skin is warm and dry.  Neurological:     Mental Status: She is alert and oriented to person, place, and time.   Dr. Tomie China notes reviewed.  Assessment  Squamous cell carcinoma of the tongue, need for central venous access Plan  Patient is scheduled for Port-A-Cath insertion on 06/10/2019.  The risks and benefits of the procedure including bleeding, infection, and pneumothorax were fully explained to the patient, who gave informed consent.

## 2019-06-02 NOTE — Patient Instructions (Signed)

## 2019-06-02 NOTE — H&P (Signed)
Terri [name]; [id number]; 02/13/1951   HPI Patient is a 68 year old black female who was referred to my care by Dr. Delton Coombes for Port-A-Cath insertion.  She was recently diagnosed with squamous cell carcinoma of the tongue and is about to undergo chemotherapy and radiation therapy.  She currently has 0 out of 10 pain. Past Medical History:  Diagnosis Date  . Hypertension     Past Surgical History:  Procedure Laterality Date  . 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 Dr. Nicolette Bang at [facility]  . TUBAL LIGATION      Family History  Problem Relation Age of Onset  . Heart failure Mother   . Heart failure Father     Current Outpatient Medications on File Prior to Visit  Medication Sig Dispense Refill  . acetaminophen (TYLENOL) 500 MG tablet Take 500 mg by mouth as needed.     [name] amLODipine (NORVASC) 5 MG tablet Take 5 mg by mouth daily.    . diclofenac (VOLTAREN) 75 MG EC tablet Take 75 mg by mouth as needed.     . hydrochlorothiazide (HYDRODIURIL) 25 MG tablet Take 25 mg by mouth daily.    [name] lidocaine (XYLOCAINE) 2 % solution USE 5ML EVERY 2 HOURS AS DIRECTED AS NEEDED 240 mL 1  . omeprazole (PRILOSEC) 40 MG capsule TAKE 1 CAPSULE BY MOUTH EVERY DAY BEFORE BREAKFAST    . sodium fluoride (PREVIDENT 5000 PLUS) 1.1 % CREA dental cream Apply to tooth brush. Brush teeth for 2 minutes. Spit out excess-DO NOT swallow. DO NOT rinse afterwards. Repeat nightly. 2 Tube prn  . HYDROcodone-acetaminophen (NORCO/VICODIN) 5-325 MG tablet Take 1 tablet by mouth every 6 (six) hours as needed for moderate pain. (Patient not taking: Reported on 06/02/2019) 30 tablet 0   No current facility-administered medications on file prior to visit.     No Known Allergies  Social History   Substance and Sexual Activity  Alcohol Use Never  . Frequency: Never    Social History   Tobacco Use  Smoking Status Never Smoker   Smokeless Tobacco Never Used    Review of Systems  Constitutional: Negative.   HENT: Negative.   Eyes: Negative.   Respiratory: Negative.   Cardiovascular: Negative.   Gastrointestinal: Negative.   Genitourinary: Negative.   Musculoskeletal: Negative.   Skin: Negative.   Neurological: Negative.   Endo/Heme/Allergies: Negative.   Psychiatric/Behavioral: Negative.     Objective   Vitals:   06/02/19 1008  BP: 134/80  Pulse: 85  Resp: 16  Temp: 97.8 F (36.6 C)  SpO2: 97%    Physical Exam Vitals signs reviewed.  Constitutional:      Appearance: Normal appearance. She is normal weight. She is not ill-appearing.  HENT:     Head: Normocephalic and atraumatic.  Cardiovascular:     Rate and Rhythm: Normal rate and regular rhythm.     Heart sounds: Normal heart sounds. No murmur. No friction rub. No gallop.   Pulmonary:     Effort: Pulmonary effort is normal. No respiratory distress.     Breath sounds: Normal breath sounds. No stridor. No wheezing, rhonchi or rales.  Skin:    General: Skin is warm and dry.  Neurological:     Mental Status: She is alert and oriented to person, place, and time.   Dr. Tomie China notes reviewed.  Assessment  Squamous cell carcinoma of the tongue, need for central venous access Plan  Patient is scheduled for Port-A-Cath insertion on 06/10/2019.  The risks and benefits of the procedure including bleeding, infection, and pneumothorax were fully explained to the patient, who gave informed consent.

## 2019-06-03 ENCOUNTER — Telehealth: Payer: Self-pay | Admitting: *Deleted

## 2019-06-03 ENCOUNTER — Ambulatory Visit (HOSPITAL_COMMUNITY): Payer: BC Managed Care – PPO

## 2019-06-03 NOTE — Telephone Encounter (Signed)
CALLED PATIENT TO ASK ABOUT COMING FOR LAB- PATIENT STATED THAT SHE IS SEEING DR. Enrique Sack ON 06-07-19, SHE AGREED TO COME FOR HER LABS ON 06-07-19 @ 10 AM @ [place]

## 2019-06-03 NOTE — Progress Notes (Signed)
Nutrition Follow-up:  Patient with SCC of tongue s/p right excision and right neck dissection on 8/7 at [facility].  Planning to start chemo and radiation therapy.  Having port placed on 9/4.   Spoke with patient via phone.  Patient reports that she is eating a little bit better since surgery.  Reports that she eats mainly 2 meals per day (breakfast boiled egg, grits, applesauce and supper soups, baked beans, broiled fish, vienna sausages, eggs salad, yogurt).  Reports that she drinks 2 ensure plus daily.  Reports that her portions sizes are small.    Medications: reviewed  Labs: creatinine 1.19  Anthropometrics:   Weight 189 lb on 8/27 and patient reports has been stable for few weeks.  Last weight of 208 lb on 7/6.    9% weight loss in the last month and 1/2, significant  Re-Estimated Energy Needs  Kcals:1900-2150 calories  Protein: 85-114 g (Using IBW) Fluid: > 2 L  NUTRITION DIAGNOSIS: Trouble chewing/swallowing improved after surgery but likely to return with upcoming treatment   INTERVENTION:  Discussed with patient chopping, grinding foods for easy of chewing.   Discussed strategies to increase calories and protein to maintain weight Encouraged patient to continue ensure 2-3 times per day for added calories.  Discussed adding protein powder, 1 scoop per day for additional protein. Contact information provided Will provide additional case of ensure enlive to patient on 9/4.      MONITORING, EVALUATION, GOAL: oral intake, weight trends, treatment side effects   NEXT VISIT: phone f/u Friday, Sept 11  Albaraa Swingle B. Zenia Resides, [place], [place] Registered Dietitian [phone] (pager)

## 2019-06-06 ENCOUNTER — Telehealth: Payer: Self-pay | Admitting: *Deleted

## 2019-06-06 NOTE — Telephone Encounter (Signed)
Returned patient's phone call, spoke with patient 

## 2019-06-07 ENCOUNTER — Ambulatory Visit: Payer: BC Managed Care – PPO | Attending: Radiation Oncology | Admitting: Physical Therapy

## 2019-06-07 ENCOUNTER — Other Ambulatory Visit: Payer: Self-pay

## 2019-06-07 ENCOUNTER — Encounter: Payer: Self-pay | Admitting: Radiation Oncology

## 2019-06-07 ENCOUNTER — Ambulatory Visit: Payer: BC Managed Care – PPO

## 2019-06-07 ENCOUNTER — Ambulatory Visit (HOSPITAL_COMMUNITY): Payer: Self-pay | Admitting: Dentistry

## 2019-06-07 ENCOUNTER — Encounter (HOSPITAL_COMMUNITY): Payer: Self-pay | Admitting: Dentistry

## 2019-06-07 ENCOUNTER — Other Ambulatory Visit (HOSPITAL_COMMUNITY)
Admission: RE | Admit: 2019-06-07 | Discharge: 2019-06-07 | Disposition: A | Discharge status: Home / Self Care | Payer: BC Managed Care – PPO | Source: Ambulatory Visit | Attending: General Surgery | Admitting: General Surgery

## 2019-06-07 ENCOUNTER — Encounter: Payer: Self-pay | Admitting: Physical Therapy

## 2019-06-07 ENCOUNTER — Ambulatory Visit
Admission: RE | Admit: 2019-06-07 | Discharge: 2019-06-07 | Disposition: A | Discharge status: Home / Self Care | Payer: BC Managed Care – PPO | Source: Ambulatory Visit | Attending: Radiation Oncology | Admitting: Radiation Oncology

## 2019-06-07 VITALS — BP 150/72 | HR 75 | Temp 98.5°F

## 2019-06-07 DIAGNOSIS — Z463 Encounter for fitting and adjustment of dental prosthetic device: Secondary | ICD-10-CM

## 2019-06-07 DIAGNOSIS — R293 Abnormal posture: Secondary | ICD-10-CM | POA: Diagnosis not present

## 2019-06-07 DIAGNOSIS — C023 Malignant neoplasm of anterior two-thirds of tongue, part unspecified: Secondary | ICD-10-CM

## 2019-06-07 DIAGNOSIS — R1310 Dysphagia, unspecified: Secondary | ICD-10-CM | POA: Insufficient documentation

## 2019-06-07 DIAGNOSIS — C029 Malignant neoplasm of tongue, unspecified: Secondary | ICD-10-CM | POA: Insufficient documentation

## 2019-06-07 DIAGNOSIS — Z01812 Encounter for preprocedural laboratory examination: Secondary | ICD-10-CM | POA: Insufficient documentation

## 2019-06-07 DIAGNOSIS — Z1329 Encounter for screening for other suspected endocrine disorder: Secondary | ICD-10-CM

## 2019-06-07 DIAGNOSIS — Z510 Encounter for antineoplastic radiation therapy: Secondary | ICD-10-CM | POA: Insufficient documentation

## 2019-06-07 DIAGNOSIS — Z20828 Contact with and (suspected) exposure to other viral communicable diseases: Secondary | ICD-10-CM | POA: Insufficient documentation

## 2019-06-07 DIAGNOSIS — R634 Abnormal weight loss: Secondary | ICD-10-CM

## 2019-06-07 DIAGNOSIS — Z01818 Encounter for other preprocedural examination: Secondary | ICD-10-CM

## 2019-06-07 DIAGNOSIS — C021 Malignant neoplasm of border of tongue: Secondary | ICD-10-CM

## 2019-06-07 LAB — TSH: TSH: 2.535 u[IU]/mL (ref 0.308–3.960)

## 2019-06-07 LAB — T4, FREE: Free T4: 0.91 ng/dL (ref 0.61–1.12)

## 2019-06-07 LAB — SARS CORONAVIRUS 2 (TAT 6-24 HRS): SARS Coronavirus 2: NEGATIVE

## 2019-06-07 NOTE — Therapy (Signed)
[place], [name], [address] Phone: [phone]   Fax:  [phone]  Physical Therapy Evaluation  Patient Details  Name: Terri [name] MRN: [id number] Date of Birth: [birth year] Referring Provider (PT): Reita May Date: 06/07/2019  PT End of Session - 06/07/19 1343    Visit Number  1    Number of Visits  1    PT Start Time  1305    PT Stop Time  1340    PT Time Calculation (min)  35 min    Activity Tolerance  Patient tolerated treatment well    Behavior During Therapy  [facility] for tasks assessed/performed       Past Medical History:  Diagnosis Date  . Hypertension     Past Surgical History:  Procedure Laterality Date  . 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 Dr. Nicolette Bang at [facility]  . TUBAL LIGATION      There were no vitals filed for this visit.   Subjective Assessment - 06/07/19 1308    Subjective  I had sugery to remove part of my tongue where the cancer was on 05/13/19. The only thing I have trouble with is I feel like I am going to pass out randomly. Sometimes it happens when I bend over and then stand up and other times it happens when I am sitting or lying down. It happened last night while I was lying in bed and I have to beat a surface to keep from passing out.    Pertinent History  05/13/19- surgery to remove part of tongue due to tongue cancer- pt will be starting chemo and radiation soon, hypertension    Patient Stated Goals  Pt states she is not really sure what her goal is    Currently in Pain?  No/denies    Pain Score  0-No pain         [facility] PT Assessment - 06/07/19 0001      Assessment   Medical Diagnosis  tongue cancer    Referring Provider (PT)  Isidore Moos    Onset Date/Surgical Date  05/13/19    Hand Dominance  Right    Prior Therapy  none      Precautions   Precautions  None      Restrictions   Weight Bearing Restrictions  No      Balance Screen   Has the patient fallen in the past 6 months  No    Has the patient had a decrease in activity level because of a fear of falling?   No    Is the patient reluctant to leave their home because of a fear of falling?   No      Home Environment   Living Environment  Private residence    Living Arrangements  Children   sons   Available Help at Discharge  Family    Type of [place]      Prior Function   Level of Independence  Independent    Vocation  Full time employment    Vocation Requirements  works at [employer], and looks for fradulent activity    Leisure  pt does not currently exercise      Cognition   Overall Cognitive Status  Within Functional Limits for tasks assessed      Posture/Postural Control   Posture/Postural Control  Postural limitations    Postural  Limitations  Rounded Shoulders;Forward head      ROM / Strength   AROM / PROM / Strength  AROM      AROM   AROM Assessment Site  Cervical    Cervical Flexion  WFL    Cervical Extension  WFL   some tightness   Cervical - Right Side Bend  50% limited    Cervical - Left Side Bend  50% limited    Cervical - Right Rotation  25% limited    Cervical - Left Rotation  25% limited        LYMPHEDEMA/ONCOLOGY QUESTIONNAIRE - 06/07/19 1320      Type   Cancer Type  tongue cancer      Surgeries   Other Surgery Date  05/13/19      Treatment   Active Chemotherapy Treatment  No   will begin soon   Past Chemotherapy Treatment  No    Active Radiation Treatment  No   will begin soon   Past Radiation Treatment  No      Lymphedema Assessments   Lymphedema Assessments  Head and Neck      Head and Neck   4 cm superior to sternal notch around neck  41.4 cm    6 cm superior to sternal notch around neck  41.7 cm    8 cm superior to sternal notch around neck  43.1 cm             Objective measurements completed on examination: See  above findings.              PT Education - 06/07/19 1353    Education Details  needs to follow up with dr about dizziness, walking program, importance of exercise article, head and neck ROM exercises, signs and symptoms of lymphedema    Person(s) Educated  Patient    Methods  Explanation;Handout    Comprehension  Verbalized understanding              Head and Neck Clinic Goals - 06/07/19 1359      Patient will be able to verbalize understanding of a home exercise program for cervical range of motion, posture, and walking.    Status  Achieved      Patient will be able to verbalize understanding of proper sitting and standing posture.    Status  Achieved      Patient will be able to verbalize understanding of lymphedema risk and availability of treatment for this condition.    Status  Achieved         Plan - 06/07/19 1344    Clinical Impression Statement  Pt presents to PT with recently diagnosed tongue cancer and underwent surgery to remove 35% of her tongue. She will be starting chemo and radiation in the near future. She presents today to be educated about signs and symptoms of lymphedema and importance of exercise throughout her treatments. Overall her ROM is functional. She has deficits in cervical lateral flexion and rotation that do not impair function. Instructed pt in ROM exercises today and issued handout. Encouraged pt to hold stretches for 5 seconds but to eventually work up to 60 second holds for maximal stretching. Took baseline measurements of neck circumferences today. Educated pt about signs and symptoms of lymphedema and to talk with her doctor about this if they present so she can be referred back to therapy. Pt does not have any current needs for skilled PT services at this time.  Personal Factors and Comorbidities  Age;Fitness    Stability/Clinical Decision Making  Evolving/Moderate complexity   pt is about to undergo chemo and  radiation   Clinical Decision Making  Moderate    Rehab Potential  Excellent    PT Frequency  One time visit    PT Treatment/Interventions  ADLs/Self Care Home Management;Patient/family education    PT Next Visit Plan  d/c this visit    PT Home Exercise Plan  head and neck ROM exercises    Consulted and Agree with Plan of Care  Patient       Patient will benefit from skilled therapeutic intervention in order to improve the following deficits and impairments:  Decreased range of motion, Decreased knowledge of precautions, Postural dysfunction  Visit Diagnosis: Abnormal posture     Problem List Patient Active Problem List   Diagnosis Date Noted  . Cancer of lateral margin of anterior two-thirds of tongue ([place]) 04/11/2019    Allyson Sabal [facility] 06/07/2019, 2:01 PM  [place], [name], [address] Phone: [phone]   Fax:  [phone]  Name: Keosha Rossa MRN: [id number] Date of Birth: [birth year]  Manus Gunning, PT 06/07/19 2:02 PM

## 2019-06-07 NOTE — Patient Instructions (Signed)
COVID-19 Education: The signs and symptoms of COVID-19 were discussed with the patient and how to seek care for testing (follow up with PCP or arrange E-visit).   The importance of social distancing was discussed today.  FLUORIDE TRAYS PATIENT INSTRUCTIONS    Obtain Prevident 5000 prescription from the pharmacy.  Don't be surprised if it needs to be ordered.  Be sure to let the pharmacy know when you are close to needing a new refill for them to have it ready for you without interruption of Fluoride use.  The best time to use your Fluoride is before bedtime.  You must brush your teeth very well and floss before using the Fluoride in order to get the best use out of the Fluoride treatments.  Place Fluoride gel in the tray and spread gel around in the tray with your finger or cotton tip applicator.  Place the tray on your lower teeth and your upper teeth.  Make sure the trays are seated all the way.  Remember, they only fit one way on your teeth.  Insert for 5 full minutes.  At the end of the 5 minutes, take the trays out.  SPIT OUT excess.   Do NOT rinse your mouth!  Do NOT eat or drink after treatments for at least 30 minutes.  This is why the best time for your treatments is before bedtime.  Clean the inside of your Fluoride trays using COLD WATER and a toothbrush.  In order to keep your Trays from discoloring and free from odors, soak them overnight in denture cleaners such as Efferdent.  Do not use bleach or non denture products.  Store the trays in a safe dry place AWAY from any heat until your next treatment.  If anything happens to your Fluoride trays, or they don't fit as well after any dental work, please let us know as soon as possible.

## 2019-06-07 NOTE — Progress Notes (Signed)
06/07/2019  Patient:            Terri [name] Date of Birth:  [birth year] MRN:                [id number]  BP (!) 150/72 (BP Location: Right Arm)   Pulse 75   Temp 98.5 F (36.9 C)   [name] presents for continued fabrication of tongue positioner as well as insertion of upper and lower fluoride trays.  Procedure: Initial fabrication of tongue positioner took place in the lab for a total of 1.5 hours of lab time. Initial try in of the tongue positioner needed adjustment to allow for better approximation of the teeth to the tongue positioner.   Acrylic was added to the lower aspect of the tongue positioner to allow for a better adaptation to the existing teeth.   Additional increments of acrylic were added to the maxillary left and maxillary right dentition to allow for opening of maxillary arch to provide safety factor for the maxillary teeth.  Further increments were added to allow for better adaptation of the occlusion to the prosthesis. The prosthesis was then adjusted and polished appropriately. Patient accepted the results of the tongue positioner.  Patient was instructed on inserting and removing the prosthetic device without complications.  Patient expressed understanding in the process of placing the tongue positioner and and out of her mouth.  Final adjustment in polishing took place in the lab for a total of 1.5 hours chair time and 0.5 hours lab time.  Fluoride carriers were tried in and adjusted as needed. [place] Trismus device was previously fabricated at 33 mm using 19 sticks. Postop instructions were provided and a written and verbal format concerning the use and care of appliances. All questions were answered. Patient is now cleared for simulation appointment and inbox message was sent to Dr. Isidore Moos to arrange for this appointment.   Patient to call if questions or problems arise before then.  Lenn Cal, DDS

## 2019-06-08 ENCOUNTER — Other Ambulatory Visit (HOSPITAL_COMMUNITY): Payer: Self-pay | Admitting: Hematology

## 2019-06-08 ENCOUNTER — Encounter (HOSPITAL_COMMUNITY)
Admission: RE | Admit: 2019-06-08 | Discharge: 2019-06-08 | Disposition: A | Discharge status: Home / Self Care | Payer: BC Managed Care – PPO | Source: Ambulatory Visit | Attending: General Surgery | Admitting: General Surgery

## 2019-06-08 NOTE — Progress Notes (Signed)
START ON PATHWAY REGIMEN - Head and Neck     A cycle is every 7 days:     Cisplatin   **Always confirm dose/schedule in your pharmacy ordering system**  Patient Characteristics: Oral Cavity, Stage III, IVA, IVB; Unresectable Disease Classification: Oral Cavity AJCC T Category: T4a Current Disease Status: No Distant Metastases and No Recurrent Disease AJCC M Category: M0 AJCC N Category: cN3b AJCC 8 Stage Grouping: IVB Intent of Therapy: Curative Intent, Discussed with Patient

## 2019-06-09 ENCOUNTER — Ambulatory Visit: Payer: BC Managed Care – PPO | Admitting: Rehabilitation

## 2019-06-09 ENCOUNTER — Encounter (HOSPITAL_COMMUNITY): Payer: Self-pay

## 2019-06-09 ENCOUNTER — Encounter

## 2019-06-09 ENCOUNTER — Other Ambulatory Visit: Payer: Self-pay

## 2019-06-09 ENCOUNTER — Telehealth (HOSPITAL_COMMUNITY): Payer: Self-pay | Admitting: *Deleted

## 2019-06-09 NOTE — Telephone Encounter (Signed)
I contacted patient today. Her radiation schedule has been made and we scheduled her for labs, chemo teaching and her first treatment.  I called her to notify of the appointments and go over any questions or concerns she had.  She did have a few questions and I was able to answer them to her satisfaction.  She feels that she is equipped to get started.  I explained that if she has any questions come up to ask them at the education session, she says she understands.

## 2019-06-10 ENCOUNTER — Ambulatory Visit (HOSPITAL_COMMUNITY): Payer: BC Managed Care – PPO | Admitting: Anesthesiology

## 2019-06-10 ENCOUNTER — Ambulatory Visit (HOSPITAL_COMMUNITY): Payer: BC Managed Care – PPO

## 2019-06-10 ENCOUNTER — Ambulatory Visit (HOSPITAL_COMMUNITY)
Admission: RE | Admit: 2019-06-10 | Discharge: 2019-06-10 | Disposition: A | Discharge status: Home / Self Care | Payer: BC Managed Care – PPO | Attending: General Surgery | Admitting: General Surgery

## 2019-06-10 ENCOUNTER — Encounter (HOSPITAL_COMMUNITY): Payer: Self-pay | Admitting: *Deleted

## 2019-06-10 ENCOUNTER — Other Ambulatory Visit: Payer: Self-pay

## 2019-06-10 ENCOUNTER — Encounter (HOSPITAL_COMMUNITY)
Admission: RE | Disposition: A | Discharge status: Home / Self Care | Payer: Self-pay | Source: Home / Self Care | Attending: General Surgery

## 2019-06-10 DIAGNOSIS — I10 Essential (primary) hypertension: Secondary | ICD-10-CM | POA: Diagnosis not present

## 2019-06-10 DIAGNOSIS — C021 Malignant neoplasm of border of tongue: Secondary | ICD-10-CM | POA: Diagnosis not present

## 2019-06-10 DIAGNOSIS — Z79899 Other long term (current) drug therapy: Secondary | ICD-10-CM | POA: Insufficient documentation

## 2019-06-10 DIAGNOSIS — C029 Malignant neoplasm of tongue, unspecified: Secondary | ICD-10-CM | POA: Insufficient documentation

## 2019-06-10 DIAGNOSIS — Z95828 Presence of other vascular implants and grafts: Secondary | ICD-10-CM

## 2019-06-10 DIAGNOSIS — K219 Gastro-esophageal reflux disease without esophagitis: Secondary | ICD-10-CM | POA: Insufficient documentation

## 2019-06-10 DIAGNOSIS — Z452 Encounter for adjustment and management of vascular access device: Secondary | ICD-10-CM

## 2019-06-10 HISTORY — PX: PORTACATH PLACEMENT: SHX2246

## 2019-06-10 SURGERY — INSERTION, TUNNELED CENTRAL VENOUS DEVICE, WITH PORT
Anesthesia: Monitor Anesthesia Care | Site: Chest | Laterality: Left

## 2019-06-10 MED ORDER — HEPARIN SOD (PORK) LOCK FLUSH 100 UNIT/ML IV SOLN
INTRAVENOUS | Status: DC | PRN
  Administered 2019-06-10: 500 [IU] via INTRAVENOUS

## 2019-06-10 MED ORDER — LACTATED RINGERS IV SOLN
INTRAVENOUS | Status: DC
  Administered 2019-06-10: 1000 mL via INTRAVENOUS

## 2019-06-10 MED ORDER — PROPOFOL 10 MG/ML IV BOLUS
INTRAVENOUS | Status: DC | PRN
  Administered 2019-06-10 (×4): 20 mg via INTRAVENOUS

## 2019-06-10 MED ORDER — KETAMINE HCL 50 MG/5ML IJ SOSY
PREFILLED_SYRINGE | INTRAMUSCULAR | Status: AC
  Filled 2019-06-10: qty 5

## 2019-06-10 MED ORDER — HYDROMORPHONE HCL 1 MG/ML IJ SOLN: 0.2500 mg | INTRAMUSCULAR | Status: DC | PRN

## 2019-06-10 MED ORDER — LIDOCAINE HCL (PF) 1 % IJ SOLN
INTRAMUSCULAR | Status: AC
  Filled 2019-06-10: qty 30

## 2019-06-10 MED ORDER — HEPARIN SOD (PORK) LOCK FLUSH 100 UNIT/ML IV SOLN
INTRAVENOUS | Status: AC
  Filled 2019-06-10: qty 5

## 2019-06-10 MED ORDER — CHLORHEXIDINE GLUCONATE CLOTH 2 % EX PADS: 6.0000 | MEDICATED_PAD | Freq: Once | CUTANEOUS | Status: DC

## 2019-06-10 MED ORDER — PROPOFOL 10 MG/ML IV BOLUS
INTRAVENOUS | Status: AC
  Filled 2019-06-10: qty 20

## 2019-06-10 MED ORDER — CEFAZOLIN SODIUM-DEXTROSE 2-4 GM/100ML-% IV SOLN
INTRAVENOUS | Status: AC
  Filled 2019-06-10: qty 100

## 2019-06-10 MED ORDER — MIDAZOLAM HCL 5 MG/5ML IJ SOLN
INTRAMUSCULAR | Status: DC | PRN
  Administered 2019-06-10 (×2): 1 mg via INTRAVENOUS

## 2019-06-10 MED ORDER — HYDROCODONE-ACETAMINOPHEN 7.5-325 MG PO TABS: 1.0000 | ORAL_TABLET | Freq: Once | ORAL | Status: DC | PRN

## 2019-06-10 MED ORDER — MIDAZOLAM HCL 2 MG/2ML IJ SOLN: 0.5000 mg | Freq: Once | INTRAMUSCULAR | Status: DC | PRN

## 2019-06-10 MED ORDER — PROMETHAZINE HCL 25 MG/ML IJ SOLN: 6.2500 mg | INTRAMUSCULAR | Status: DC | PRN

## 2019-06-10 MED ORDER — LIDOCAINE HCL (PF) 1 % IJ SOLN
INTRAMUSCULAR | Status: DC | PRN
  Administered 2019-06-10: 10 mL

## 2019-06-10 MED ORDER — HYDROCODONE-ACETAMINOPHEN 5-325 MG PO TABS: 1.0000 | ORAL_TABLET | ORAL | 0 refills | Status: DC | PRN

## 2019-06-10 MED ORDER — MIDAZOLAM HCL 2 MG/2ML IJ SOLN
INTRAMUSCULAR | Status: AC
  Filled 2019-06-10: qty 2

## 2019-06-10 MED ORDER — FENTANYL CITRATE (PF) 100 MCG/2ML IJ SOLN
INTRAMUSCULAR | Status: AC
  Filled 2019-06-10: qty 2

## 2019-06-10 MED ORDER — KETOROLAC TROMETHAMINE 30 MG/ML IJ SOLN
30.0000 mg | Freq: Once | INTRAMUSCULAR | Status: AC
  Administered 2019-06-10: 30 mg via INTRAVENOUS
  Filled 2019-06-10: qty 1

## 2019-06-10 MED ORDER — PROPOFOL 500 MG/50ML IV EMUL
INTRAVENOUS | Status: DC | PRN
  Administered 2019-06-10: 50 ug/kg/min via INTRAVENOUS

## 2019-06-10 MED ORDER — SODIUM CHLORIDE (PF) 0.9 % IJ SOLN
INTRAMUSCULAR | Status: DC | PRN
  Administered 2019-06-10: 10 mL via INTRAVENOUS

## 2019-06-10 MED ORDER — CEFAZOLIN SODIUM-DEXTROSE 2-4 GM/100ML-% IV SOLN
2.0000 g | INTRAVENOUS | Status: AC
  Administered 2019-06-10: 2 g via INTRAVENOUS

## 2019-06-10 SURGICAL SUPPLY — 30 items
APPLICATOR CHLORAPREP 10.5 ORG (MISCELLANEOUS) ×3 IMPLANT
BAG DECANTER FOR FLEXI CONT (MISCELLANEOUS) ×3 IMPLANT
CLOTH BEACON ORANGE TIMEOUT ST (SAFETY) ×3 IMPLANT
COVER LIGHT HANDLE STERIS (MISCELLANEOUS) ×6 IMPLANT
COVER WAND RF STERILE (DRAPES) ×3 IMPLANT
DECANTER SPIKE VIAL GLASS SM (MISCELLANEOUS) ×3 IMPLANT
DERMABOND ADVANCED (GAUZE/BANDAGES/DRESSINGS) ×2
DERMABOND ADVANCED .7 DNX12 (GAUZE/BANDAGES/DRESSINGS) ×1 IMPLANT
DRAPE C-ARM FOLDED MOBILE STRL (DRAPES) ×3 IMPLANT
ELECT REM PT RETURN 9FT ADLT (ELECTROSURGICAL) ×3
ELECTRODE REM PT RTRN 9FT ADLT (ELECTROSURGICAL) ×1 IMPLANT
GLOVE BIO SURGEON STRL SZ7 (GLOVE) ×2 IMPLANT
GLOVE BIOGEL PI IND STRL 7.0 (GLOVE) ×2 IMPLANT
GLOVE BIOGEL PI INDICATOR 7.0 (GLOVE) ×4
GLOVE SURG SS PI 7.5 STRL IVOR (GLOVE) ×3 IMPLANT
GOWN STRL REUS W/TWL LRG LVL3 (GOWN DISPOSABLE) ×6 IMPLANT
IV NS 500ML (IV SOLUTION) ×2
IV NS 500ML BAXH (IV SOLUTION) ×1 IMPLANT
KIT PORT POWER 8FR ISP MRI (Port) ×3 IMPLANT
KIT TURNOVER KIT A (KITS) ×3 IMPLANT
NDL HYPO 25X1 1.5 SAFETY (NEEDLE) ×1 IMPLANT
NEEDLE HYPO 25X1 1.5 SAFETY (NEEDLE) ×3 IMPLANT
PACK MINOR (CUSTOM PROCEDURE TRAY) ×3 IMPLANT
PAD ARMBOARD 7.5X6 YLW CONV (MISCELLANEOUS) ×3 IMPLANT
SET BASIN LINEN APH (SET/KITS/TRAYS/PACK) ×3 IMPLANT
SUT MNCRL AB 4-0 PS2 18 (SUTURE) ×3 IMPLANT
SUT VIC AB 3-0 SH 27 (SUTURE) ×2
SUT VIC AB 3-0 SH 27X BRD (SUTURE) ×1 IMPLANT
SYR 5ML LL (SYRINGE) ×3 IMPLANT
SYR CONTROL 10ML LL (SYRINGE) ×3 IMPLANT

## 2019-06-10 NOTE — Op Note (Signed)
Patient:  Terri [name]  DOB:  1950-11-30  MRN:  [id number]   Preop Diagnosis: Tongue carcinoma, need for central venous access  Postop Diagnosis: Same  Procedure: Port-A-Cath insertion  Surgeon: Aviva Signs, MD  Anes: MAC  Indications: Patient is a 68 year old black female who is about to undergo chemotherapy for tongue carcinoma.  The risks and benefits of the procedure including bleeding, infection, and pneumothorax were fully explained to the patient, who gave informed consent.  Procedure note: The patient was placed in Trendelenburg position after the left upper chest was prepped and draped using usual sterile technique with ChloraPrep.  Surgical site confirmation was performed.  1% Xylocaine was used for local anesthesia.  Incision was made below the left clavicle.  A subcutaneous pocket was formed.  A needle was advanced into the left subclavian vein using the Seldinger technique without difficulty.  A guidewire was then advanced into the right atrium under fluoroscopic guidance.  An introducer and peel-away sheath were placed over the guidewire.  The catheter was inserted through the peel-away sheath and the peel-away sheath was removed.  The catheter was then attached to the port and the port placed in subcutaneous pocket.  Adequate positioning was confirmed by fluoroscopy.  Good backflow of venous blood was noted on aspiration of the port.  The port was flushed with heparin flush.  The subcutaneous layer was reapproximated using a 3-0 Vicryl interrupted suture.  The skin was closed using a 4-0 Monocryl subcuticular suture.  Dermabond was applied.  All tape and needle counts were correct at the end of the procedure.  The patient was awakened and transferred to PACU in stable condition.  A chest x-ray will be performed at that time.  Complications: None  EBL: Minimal  Specimen: None

## 2019-06-10 NOTE — Interval H&P Note (Signed)
History and Physical Interval Note:  06/10/2019 7:12 AM  Terri [name]  has presented today for surgery, with the diagnosis of tongue cancer.  The various methods of treatment have been discussed with the patient and family. After consideration of risks, benefits and other options for treatment, the patient has consented to  Procedure(s) with comments: INSERTION PORT-A-CATH (N/A) - pt knows to arrive at 6:15 as a surgical intervention.  The patient's history has been reviewed, patient examined, no change in status, stable for surgery.  I have reviewed the patient's chart and labs.  Questions were answered to the patient's satisfaction.     Aviva Signs

## 2019-06-10 NOTE — Transfer of Care (Signed)
Immediate Anesthesia Transfer of Care Note  Patient: Terri [name]  Procedure(s) Performed: INSERTION PORT-A-CATH (Left Chest)  Patient Location: PACU  Anesthesia Type:MAC  Level of Consciousness: awake  Airway & Oxygen Therapy: Patient Spontanous Breathing  Post-op Assessment: Report given to RN  Post vital signs: Reviewed and stable  Last Vitals:  Vitals Value Taken Time  BP 118/69 06/10/19 0815  Temp    Pulse 71 06/10/19 0818  Resp 14 06/10/19 0818  SpO2 98 % 06/10/19 0818  Vitals [name] include unvalidated device data.  Last Pain:  Vitals:   06/10/19 0653  TempSrc: Oral  PainSc: 0-No pain      Patients Stated Pain Goal: 6 (25/74/93 5521)  Complications: No apparent anesthesia complications

## 2019-06-10 NOTE — Anesthesia Preprocedure Evaluation (Signed)
Anesthesia Evaluation  Patient identified by MRN, date of birth, ID band Patient awake    Reviewed: Allergy & Precautions, NPO status , Patient's Chart, lab work & pertinent test results  Airway Mallampati: III  TM Distance: >3 FB Neck ROM: Full   Comment: S/p resent resection/LNB-  Dental no notable dental hx. (+) Teeth Intact   Pulmonary neg pulmonary ROS,    Pulmonary exam normal breath sounds clear to auscultation       Cardiovascular Exercise Tolerance: Good hypertension, Pt. on medications negative cardio ROS Normal cardiovascular examI Rhythm:Regular Rate:Normal     Neuro/Psych negative neurological ROS  negative psych ROS   GI/Hepatic Neg liver ROS, GERD  Medicated and Controlled,  Endo/Other  negative endocrine ROS  Renal/GU negative Renal ROS  negative genitourinary   Musculoskeletal negative musculoskeletal ROS (+)   Abdominal   Peds negative pediatric ROS (+)  Hematology negative hematology ROS (+)   Anesthesia Other Findings Tongue Ca -here for port  Reproductive/Obstetrics negative OB ROS                             Anesthesia Physical Anesthesia Plan  ASA: II  Anesthesia Plan: MAC   Post-op Pain Management:    Induction: Intravenous  PONV Risk Score and Plan: 2 and Propofol infusion, TIVA, Midazolam and Treatment may vary due to age or medical condition  Airway Management Planned: Nasal Cannula and Simple Face Mask  Additional Equipment:   Intra-op Plan:   Post-operative Plan:   Informed Consent: I have reviewed the patients History and Physical, chart, labs and discussed the procedure including the risks, benefits and alternatives for the proposed anesthesia with the patient or authorized representative who has indicated his/her understanding and acceptance.     Dental advisory given  Plan Discussed with: CRNA  Anesthesia Plan Comments: (Plan Full PPE  use  Plan MAC - with GA vs GETA as needed d/w pt - WTP with same after Q&A)        Anesthesia Quick Evaluation

## 2019-06-10 NOTE — Discharge Instructions (Signed)

## 2019-06-10 NOTE — Anesthesia Postprocedure Evaluation (Signed)
Anesthesia Post Note  Patient: Terri [name]  Procedure(s) Performed: INSERTION PORT-A-CATH (Left Chest)  Patient location during evaluation: PACU Anesthesia Type: MAC Level of consciousness: awake and alert and oriented Pain management: pain level controlled Vital Signs Assessment: post-procedure vital signs reviewed and stable Respiratory status: spontaneous breathing Cardiovascular status: blood pressure returned to baseline and stable Postop Assessment: no apparent nausea or vomiting Anesthetic complications: no     Last Vitals:  Vitals:   06/10/19 0653  BP: 131/72  Pulse: 65  Resp: 15  Temp: 36.7 C  SpO2: 100%    Last Pain:  Vitals:   06/10/19 0653  TempSrc: Oral  PainSc: 0-No pain                 Phong Isenberg

## 2019-06-14 ENCOUNTER — Encounter (HOSPITAL_COMMUNITY): Payer: Self-pay | Admitting: General Surgery

## 2019-06-14 ENCOUNTER — Encounter (HOSPITAL_COMMUNITY): Payer: Self-pay | Admitting: General Practice

## 2019-06-14 ENCOUNTER — Ambulatory Visit (HOSPITAL_COMMUNITY): Payer: BC Managed Care – PPO | Admitting: Hematology

## 2019-06-14 ENCOUNTER — Inpatient Hospital Stay (HOSPITAL_COMMUNITY): Payer: BC Managed Care – PPO | Attending: Hematology | Admitting: General Practice

## 2019-06-14 DIAGNOSIS — Z79899 Other long term (current) drug therapy: Secondary | ICD-10-CM | POA: Insufficient documentation

## 2019-06-14 DIAGNOSIS — I10 Essential (primary) hypertension: Secondary | ICD-10-CM | POA: Insufficient documentation

## 2019-06-14 DIAGNOSIS — K5900 Constipation, unspecified: Secondary | ICD-10-CM | POA: Insufficient documentation

## 2019-06-14 DIAGNOSIS — Z5111 Encounter for antineoplastic chemotherapy: Secondary | ICD-10-CM | POA: Insufficient documentation

## 2019-06-14 DIAGNOSIS — Z95828 Presence of other vascular implants and grafts: Secondary | ICD-10-CM | POA: Insufficient documentation

## 2019-06-14 DIAGNOSIS — Z923 Personal history of irradiation: Secondary | ICD-10-CM | POA: Insufficient documentation

## 2019-06-14 DIAGNOSIS — C023 Malignant neoplasm of anterior two-thirds of tongue, part unspecified: Secondary | ICD-10-CM | POA: Insufficient documentation

## 2019-06-14 HISTORY — DX: Presence of other vascular implants and grafts: Z95.828

## 2019-06-14 MED ORDER — PROCHLORPERAZINE MALEATE 10 MG PO TABS: 10.0000 mg | ORAL_TABLET | Freq: Four times a day (QID) | ORAL | 1 refills | Status: DC | PRN

## 2019-06-14 MED ORDER — LIDOCAINE-PRILOCAINE 2.5-2.5 % EX CREA: TOPICAL_CREAM | CUTANEOUS | 3 refills | Status: DC

## 2019-06-14 NOTE — Progress Notes (Signed)
[place] Initial Psychosocial Assessment Clinical Social Work  Clinical Social Work contacted by phone to assess psychosocial, emotional, mental health, and spiritual needs of the patient.   Barriers to care/review of distress screen:  - Transportation:  Do you anticipate any problems getting to appointments?  Do you have someone who can help:  Friend/family can drive if needed.   - Help at home:  What is your living situation (alone, family, other)?  If you are physically unable to care for yourself, who would you call on to help you?  Lives w two sons, "very supportive and right there with me."  They "make sure I am doing everything I can do - oldest son is into natural health, he is helping find natural and homeopathic remedies" which she found helpful prior to beginning treatment.   - Support system:  What does your support system look like?  Who would you call on if you needed some kind of practical help?  What if you needed someone to talk to for emotional support?  Sons are supportive in practical ways, is very active in church and has very supportive friendships, also has good workplace friends "always checking in with me" "rely on my faith" - Finances:  Are you concerned about finances.  Considering returning to work?  If not, applying for disability? Still working, has short term and long term disability, "one thing I dont have to worry about is working", would be difficult to both work and be in treatment, but intends to work from home when finished with treatment   What is your understanding of where you are with your cancer? Its cause?  Your treatment plan and what happens next?  Has one more procedure to do before treatments begin tomorrow - will have CT Sim at [facility] tmw.  Trying to "absorb the things I have been told to expect...just have to wait and see."  Also doing chemotherapy at [facility].  Has spoken w dentist at [facility] who went over short and long term side effect, "not very encouraging at  all..."  Has met w dietician and feels she can "do it and understand the importance of maintaining nutrition as much as I can."    What are your worries for the future as you begin treatment for cancer?  "the only thing that I worry about (I am trying not to worry...), but the only reason I elected to go with these treatments is to prevent this from coming back."  Wants to prevent reoccurance - has been previously very healthy.  "This is the only thing that has happened in my life that has knocked me off my feet."  No close relatives in family that experienced cancer.  Has "heard of throat cancer, never heard of tongue cancer, when this is me it was unexpected." Is faithful in all areas of health maintenance, dental care, MD visits.  Had to question "why/how", had to come to grips with this - "I dont know what caused it if I did, I would prevent it from coming back"  Aware that cancer was found in two lymph nodes "went two months unable to eat due to pain", has lost weight as a result  What are your hopes and priorities during your treatment? What is important to you? What are your goals for your care?  "If I could be assured this cancer would not come back, I would not do the chemo and radiation.  Was told from the beginning that surgery would be  all I would need...." Attending church is very important to here - due to COVID restrictions, her church is now "drive in", so can continue to attend.  Community limitations and care treatment is a "pandemic within a pandemic" - I "have to talk to myself", looks for ways to be grateful and hopeful.     CSW Summary:  Patient and family psychosocial functioning including strengths, limitations, and coping skills:  Strong support from two sons, church family and work.  Positive attitude, determination.  No financial barriers- has insurance and workplace disability.  Future oriented - hopes to prevent recurrence of cancer through treatment and return to work.  Need  for additional chemo and radiation after surgery was a surprise - feels well so it is challenging to embark on treatment regimen that she anticipates will lead to her feeling unwell.  Was initially told surgery was all she would need.  Wants to keep in communication w treatment team and get side effects addressed in timely manner.    Identifications of barriers to care:  Distance from home to [facility] - will be driving in daily.  Does not anticipate problems at this time, but lives at a significant distance.    Availability of community resources:  Provided information on [place] and [employer] resources via email.    Clinical Social Worker follow up needed: No.   Edwyna Shell, [place] Social Worker Phone:  [phone] Cell:  [phone]

## 2019-06-14 NOTE — Patient Instructions (Addendum)
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  You will be treated weekly with cisplatin in combination with radiation therapy.  The intent of this treatment is cure.  You will see the doctor regularly throughout treatment.  We will obtain blood work from you prior to every treatment and monitor your results to make sure it is safe to give your treatment. The doctor monitors your response to treatment by the way you are feeling, your blood work, and scans periodically.  There will be wait times while you are here for treatment. It will take about 30 minutes to 1 hour for your lab work to result.  Then there will be wait times while pharmacy mixes your medications.   Medications you will receive in the clinic prior to your chemotherapy medications:  Aloxi:  ALOXI is used in adults to help prevent nausea and vomiting that happens with certain chemotherapy drugs.  Aloxi is a long acting medication, and will remain in your system for about two days.   Emend:  This is an anti-nausea medication that is used with Aloxi to help prevent nausea and vomiting caused by chemotherapy.  Dexamethasone:  This is a steroid given prior to chemotherapy to help prevent allergic reactions; it may also help prevent and control nausea and diarrhea.    CISPLATIN  About This Drug Cisplatin is a drug used to treat cancer. This drug is given in the vein (IV).  This will take 1 hour to infuse.  With this drug you will receive 2 hours of IV hydration prior to administration and 2 hours of IV hydration after administration.  This is to help protect your kidneys.  You will have to urinate 200 mL prior to receiving this medication.  We will give you something to measure your urine in.   Possible Side Effects (More Common) . This drug may affect how your kidneys work. Your kidney function will be checked weekly and as needed. . Electrolyte  changes (potassium, sodium, magnesium). Your blood will be checked for electrolyte changes as needed. . High-frequency hearing loss may occur. You will get IV fluids before and during the Cisplatin infusion to help prevent this. You may also get ringing in the ears. . Bone marrow depression. This is a decrease in the number of white blood cells, red blood cells, and platelets. This may raise your risk of infection, make you tired and weak (fatigue), and raise your risk of bleeding. . Nausea and throwing up (vomiting). These symptoms may happen within a few hours after your treatment and may last for a few days to a week. Medicines are available to stop or lessen these side effects.  Possible Side Effects (Less Common) . Effects on the nerves are called peripheral neuropathy. You may feel numbness or pain in your hands and feet. It may be hard for you to button your clothes, open jars, or walk as usual. The effect on the nerves may get worse with more doses of the drug. These effects get better in some people after the drug is stopped, but it does not get better in all people. [name] Blurred vision or other changes in eyesight. . Soreness of the mouth and throat. You may have red areas, white patches, or sores that hurt. . Hair loss. You may notice your hair getting thin. Some patients lose their hair. Your hair often grows back when treatment is done.  Allergic Reactions Allergic reactions to this drug are rare, but may happen in some patients. Signs of allergic reactions to this drug may be a rash, fever, chills, feeling dizzy, trouble breathing, and/or feeling that your heart is beating in a fast or not normal way.  Treating Side Effects . Drink 6-8 cups of fluids each day unless your doctor has told you to limit your fluid intake due to some other health problem. A cup is 8 ounces of fluid. If you throw up or have loose bowel movements you should drink more fluids so that you do not become dehydrated  (lack water in the body due to losing too much fluid). . If you have numbness and tingling in your hands and feet, be careful when cooking, walking, and handling sharp objects and hot liquids. . Mouth care is very important. Your mouth care should consist of routine, gentle cleaning of your teeth or dentures and rinsing your mouth with a mixture of 1/2 teaspoon of salt in 8 ounces of water or  teaspoon of baking soda in 8 ounces of water. This should be done at least after each meal and at bedtime. . If you have mouth sores, avoid mouthwash that has alcohol. Also avoid alcohol and smoking because they can bother your mouth and throat. . Talk with your nurse about getting a wig before you lose your hair. Also, call the [facility] at 800-ACS-2345 to find out information about the "Look Good, Feel Better" program close to where you live. It is a free program where women getting chemotherapy can learn about wigs, turbans and scarves as well as makeup techniques and skin and nail care.  Food and Drug Interactions  There are no known interactions of Cisplatin with food. This drug may interact with other medicines. Tell your doctor and pharmacist about all the medicines and dietary supplements (vitamins, minerals, herbs and others) that you are taking at this time. The safety and use of dietary supplements and alternative diets are often not known. Using these might affect your cancer or interfere with your treatment. Until more is known, you should not use dietary supplements or alternative diets without your cancer doctor's help.  When to Call the Doctor  Call your doctor or nurse right away if you have any of these symptoms: . Rash or itching . Feeling dizzy or lightheaded . Wheezing or trouble breathing . Swelling of the face . Fever of 100.5 F (38 C) or above . Chills . Easy bleeding or bruising . Decreased urine . Weight gain of 5 pounds in one week (fluid retention) . Nausea that  stops you from eating or drinking . Throwing up more than 3 times a day  Call your doctor or nurse as soon as possible if you have these symptoms: . Numbness, tingling, decreased feeling or weakness in fingers, toes, arms, or legs . Trouble walking or changes in the way you walk, feeling clumsy when buttoning clothes, opening jars, or other routine hand motions . Blurred vision or other changes in eyesight . Changes in hearing, ringing in the ears . Pain in your mouth or throat that makes it hard to eat or drink . Fatigue that interferes with your daily activities  Sexual Problems and Reproductive Concerns  . Infertility warning: Sexual problems and reproduction concerns may occur. In both men and women, this drug may affect your ability to have children. This cannot be determined before your treatment. Speak with your doctor or nurse if you  plan to have children. Ask for information on sperm or egg banking. . In men, this drug may interfere with your ability to make sperm, but it should not change your ability to have sexual relations. . In women, menstrual bleeding may become irregular or stop while you are receiving this drug. Do not assume that you cannot become pregnant if you do not have a menstrual period. . Women may experience signs of menopause like vaginal dryness, itching, and pain during sexual relations . Genetic counseling is available for you to talk about the effects of this drug therapy on future pregnancies. Also, a genetic counselor can look at the possible risk of problems in the unborn baby due to this medicine if an exposure happens during pregnancy.  SELF CARE ACTIVITIES WHILE RECEIVING CHEMOTHERAPY:  Hydration Increase your fluid intake 48 hours prior to treatment and drink at least 8 to 12 cups (64 ounces) of water/decaffeinated beverages per day after treatment. You can still have your cup of coffee or soda but these beverages do not count as part of your 8 to 12 cups  that you need to drink daily. No alcohol intake.  Medications Continue taking your normal prescription medication as prescribed.  If you start any new herbal or new supplements please let us know first to make sure it is safe.  Mouth Care Have teeth cleaned professionally before starting treatment. Keep dentures and partial plates clean. Use soft toothbrush and do not use mouthwashes that contain alcohol. Biotene is a good mouthwash that is available at most pharmacies or may be ordered by calling [phone]. Use warm salt water gargles (1 teaspoon salt per 1 quart warm water) before and after meals and at bedtime. If you need dental work, please let the doctor know before you go for your appointment so that we can coordinate the best possible time for you in regards to your chemo regimen. You need to also let your dentist know that you are actively taking chemo. We may need to do labs prior to your dental appointment.  Skin Care Always use sunscreen that has not expired and with SPF (Sun Protection Factor) of 50 or higher. Wear hats to protect your head from the sun. Remember to use sunscreen on your hands, ears, face, & feet.  Use good moisturizing lotions such as udder cream, eucerin, or even Vaseline. Some chemotherapies can cause dry skin, color changes in your skin and nails.    . Avoid long, hot showers or baths. . Use gentle, fragrance-free soaps and laundry detergent. . Use moisturizers, preferably creams or ointments rather than lotions because the thicker consistency is better at preventing skin dehydration. Apply the cream or ointment within 15 minutes of showering. Reapply moisturizer at night, and moisturize your hands every time after you wash them.  Hair Loss (if your doctor says your hair will fall out)  . If your doctor says that your hair is likely to fall out, decide before you begin chemo whether you want to wear a wig. You may want to shop before treatment to match your  hair color. . Hats, turbans, and scarves can also camouflage hair loss, although some people prefer to leave their heads uncovered. If you go bare-headed outdoors, be sure to use sunscreen on your scalp. . Cut your hair short. It eases the inconvenience of shedding lots of hair, but it also can reduce the emotional impact of watching your hair fall out. . Don't perm or color your hair during  chemotherapy. Those chemical treatments are already damaging to hair and can enhance hair loss. Once your chemo treatments are done and your hair has grown back, it's OK to resume dyeing or perming hair.  With chemotherapy, hair loss is almost always temporary. But when it grows back, it may be a different color or texture. In older adults who still had hair color before chemotherapy, the new growth may be completely gray.  Often, new hair is very fine and soft.  Infection Prevention Please wash your hands for at least 30 seconds using warm soapy water. Handwashing is the #1 way to prevent the spread of germs. Stay away from sick people or people who are getting over a cold. If you develop respiratory systems such as green/yellow mucus production or productive cough or persistent cough let us know and we will see if you need an antibiotic. It is a good idea to keep a pair of gloves on when going into grocery stores/[employer] to decrease your risk of coming into contact with germs on the carts, etc. Carry alcohol hand gel with you at all times and use it frequently if out in public. If your temperature reaches 100.5 or higher please call the clinic and let us know.  If it is after hours or on the weekend please go to the ER if your temperature is over 100.5.  Please have your own personal thermometer at home to use.    Sex and bodily fluids If you are going to have sex, a condom must be used to protect the person that isn't taking chemotherapy. Chemo can decrease your libido (sex drive). For a few days after  chemotherapy, chemotherapy can be excreted through your bodily fluids.  When using the toilet please close the lid and flush the toilet twice.  Do this for a few day after you have had chemotherapy.   Effects of chemotherapy on your sex life Some changes are simple and won't last long. They won't affect your sex life permanently.  Sometimes you may feel: . too tired . not strong enough to be very active . sick or sore  . not in the mood . anxious or low Your anxiety might not seem related to sex. For example, you may be worried about the cancer and how your treatment is going. Or you may be worried about money, or about how you family are coping with your illness. These things can cause stress, which can affect your interest in sex. It's important to talk to your partner about how you feel. Remember - the changes to your sex life don't usually last long. There's usually no medical reason to stop having sex during chemo. The drugs won't have any long term physical effects on your performance or enjoyment of sex. Cancer can't be passed on to your partner during sex  Contraception It's important to use reliable contraception during treatment. Avoid getting pregnant while you or your partner are having chemotherapy. This is because the drugs may harm the baby. Sometimes chemotherapy drugs can leave a man or woman infertile.  This means you would not be able to have children in the future. You might want to talk to someone about permanent infertility. It can be very difficult to learn that you may no longer be able to have children. Some people find counselling helpful. There might be ways to preserve your fertility, although this is easier for men than for women. You may want to speak to a fertility expert. You can  talk about sperm banking or harvesting your eggs. You can also ask about other fertility options, such as donor eggs. If you have or have had breast cancer, your doctor might advise you not  to take the contraceptive pill. This is because the hormones in it might affect the cancer.  It is not known for sure whether or not chemotherapy drugs can be passed on through semen or secretions from the vagina. Because of this some doctors advise people to use a barrier method if you have sex during treatment. This applies to vaginal, anal or oral sex. Generally, doctors advise a barrier method only for the time you are actually having the treatment and for about a week after your treatment. Advice like this can be worrying, but this does not mean that you have to avoid being intimate with your partner. You can still have close contact with your partner and continue to enjoy sex.  Animals If you have cats or birds we just ask that you not change the litter or change the cage.  Please have someone else do this for you while you are on chemotherapy.   Food Safety During and After Cancer Treatment Food safety is important for people both during and after cancer treatment. Cancer and cancer treatments, such as chemotherapy, radiation therapy, and stem cell/bone marrow transplantation, often weaken the immune system. This makes it harder for your body to protect itself from foodborne illness, also called food poisoning. Foodborne illness is caused by eating food that contains harmful bacteria, parasites, or viruses.  Foods to avoid Some foods have a higher risk of becoming tainted with bacteria. These include: [name] Unwashed fresh fruit and vegetables, especially leafy vegetables that can hide dirt and other contaminants . Raw sprouts, such as alfalfa sprouts . Raw or undercooked beef, especially ground beef, or other raw or undercooked meat and poultry . Fatty, fried, or spicy foods immediately before or after treatment.  These can sit heavy on your stomach and make you feel nauseous. . Raw or undercooked shellfish, such as oysters. . Sushi and sashimi, which often contain raw fish.  . Unpasteurized  beverages, such as unpasteurized fruit juices, raw milk, raw yogurt, or cider . Undercooked eggs, such as soft boiled, over easy, and poached; raw, unpasteurized eggs; or foods made with raw egg, such as homemade raw cookie dough and homemade mayonnaise  Simple steps for food safety  Shop smart. . Do not buy food stored or displayed in an unclean area. . Do not buy bruised or damaged fruits or vegetables. . Do not buy cans that have cracks, dents, or bulges. . Pick up foods that can spoil at the end of your shopping trip and store them in a cooler on the way home.  Prepare and clean up foods carefully. . Rinse all fresh fruits and vegetables under running water, and dry them with a clean towel or paper towel. . Clean the top of cans before opening them. . After preparing food, wash your hands for 20 seconds with hot water and soap. Pay special attention to areas between fingers and under nails. . Clean your utensils and dishes with hot water and soap. [name] Disinfect your kitchen and cutting boards using 1 teaspoon of liquid, unscented bleach mixed into 1 quart of water.    Dispose of old food. . Eat canned and packaged food before its expiration date (the "use by" or "best before" date). . Consume refrigerated leftovers within 3 to 4 days. After that  time, throw out the food. Even if the food does not smell or look spoiled, it still may be unsafe. Some bacteria, such as Listeria, can grow even on foods stored in the refrigerator if they are kept for too long.  Take precautions when eating out. . At restaurants, avoid buffets and salad bars where food sits out for a long time and comes in contact with many people. Food can become contaminated when someone with a virus, often a norovirus, or another "bug" handles it. . Put any leftover food in a "to-go" container yourself, rather than having the server do it. And, refrigerate leftovers as soon as you get home. . Choose restaurants that are clean  and that are willing to prepare your food as you order it cooked.   AT HOME MEDICATIONS:                                                                                                                                                                Compazine/Prochlorperazine 10mg  tablet. Take 1 tablet every 6 hours as needed for nausea/vomiting. (This can make you sleepy)   EMLA cream. Apply a quarter size amount to port site 1 hour prior to chemo. Do not rub in. Cover with plastic wrap.    Diarrhea Sheet   If you are having loose stools/diarrhea, please purchase Imodium and begin taking as outlined:  At the first sign of poorly formed or loose stools you should begin taking Imodium (loperamide) 2 mg capsules.  Take two tablets (4mg ) followed by one tablet (2mg ) every 2 hours - DO NOT EXCEED 8 tablets in 24 hours.  If it is bedtime and you are having loose stools, take 2 tablets at bedtime, then 2 tablets every 4 hours until morning.   Always call the [facility] if you are having loose stools/diarrhea that you can't get under control.  Loose stools/diarrhea leads to dehydration (loss of water) in your body.  We have other options of trying to get the loose stools/diarrhea to stop but you must let us know!   Constipation Sheet  Colace - 100 mg capsules - take 2 capsules daily.  If this doesn't help then you can increase to 2 capsules twice daily.  Please call if the above does not work for you. Do not go more than 2 days without a bowel movement.  It is very important that you do not become constipated.  It will make you feel sick to your stomach (nausea) and can cause abdominal pain and vomiting.  Nausea Sheet   Compazine/Prochlorperazine 10mg  tablet. Take 1 tablet every 6 hours as needed for nausea/vomiting (This can make you drowsy).  If you are having persistent nausea (nausea that does not stop) please call the [facility] and let  us know the amount of nausea that you are  experiencing.  If you begin to vomit, you need to call the [facility] and if it is the weekend and you have vomited more than one time and can't get it to stop-go to the Emergency Room.  Persistent nausea/vomiting can lead to dehydration (loss of fluid in your body) and will make you feel very weak and unwell. Ice chips, sips of clear liquids, foods that are at room temperature, crackers, and toast tend to be better tolerated.    SYMPTOMS TO REPORT AS SOON AS POSSIBLE AFTER TREATMENT:  FEVER GREATER THAN 100.5 F  CHILLS WITH OR WITHOUT FEVER  NAUSEA AND VOMITING THAT IS NOT CONTROLLED WITH YOUR NAUSEA MEDICATION  UNUSUAL SHORTNESS OF BREATH  UNUSUAL BRUISING OR BLEEDING  TENDERNESS IN MOUTH AND THROAT WITH OR WITHOUT   PRESENCE OF ULCERS  URINARY PROBLEMS  BOWEL PROBLEMS  UNUSUAL RASH      Wear comfortable clothing and clothing appropriate for easy access to any Portacath or PICC line. Let us know if there is anything that we can do to make your therapy better!    What to do if you need assistance after hours or on the weekends: CALL [phone].  HOLD on the line, do not hang up.  You will hear multiple messages but at the end you will be connected with a nurse triage line.  They will contact the doctor if necessary.  Most of the time they will be able to assist you.  Do not call the hospital operator.      I have been informed and understand all of the instructions given to me and have received a copy. I have been instructed to call the clinic [phone] or my family physician as soon as possible for continued medical care, if indicated. I do not have any more questions at this time but understand that I may call the [facility] or the Patient Navigator at [phone] during office hours should I have questions or need assistance in obtaining follow-up care.

## 2019-06-15 ENCOUNTER — Other Ambulatory Visit: Payer: Self-pay

## 2019-06-15 ENCOUNTER — Ambulatory Visit
Admission: RE | Admit: 2019-06-15 | Discharge: 2019-06-15 | Disposition: A | Discharge status: Home / Self Care | Payer: BC Managed Care – PPO | Source: Ambulatory Visit | Attending: Radiation Oncology | Admitting: Radiation Oncology

## 2019-06-15 ENCOUNTER — Encounter: Payer: Self-pay | Admitting: Radiation Oncology

## 2019-06-15 DIAGNOSIS — C023 Malignant neoplasm of anterior two-thirds of tongue, part unspecified: Secondary | ICD-10-CM | POA: Diagnosis not present

## 2019-06-15 NOTE — Progress Notes (Signed)
Head and Neck Cancer Simulation, IMRT treatment planning, and Special treatment procedure note   Outpatient  Diagnosis:    ICD-10-CM   1. 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  All relevant records and images related to the planned course of therapy were reviewed.  The patient freely provided informed written consent to proceed with treatment after reviewing the details related to the planned course of therapy. The consent form was witnessed and verified by the simulation staff.    The patient was laid in the supine position on the table. An Aquaplast head and shoulder mask was custom fitted to the patient's anatomy. High-resolution CT axial imaging was obtained of the head and neck. I verified that the quality of the imaging is good for treatment planning. 1 Medically Necessary Treatment Device was fabricated and supervised by me: Aquaplast mask.  Treatment planning note I plan to treat the patient with IMRT. I plan to treat the patient's tumor bed and bilateral neck nodes. I plan to treat to a total dose of 60 Gray in 30  fractions. Dose calculation was ordered from dosimetry.  IMRT planning Note  IMRT is medically necessary and an important modality to deliver adequate dose to the patient's at risk tissues while sparing the patient's normal structures, including the: esophagus, parotid tissue, mandible, brain stem, spinal cord, oral cavity, brachial plexus.  This justifies the use of IMRT in the patient's treatment.   Special Treatment Procedure Note:  The patient will be receiving chemotherapy concurrently. Chemotherapy heightens the risk of side effects. I have considered this during the patient's treatment planning process and will monitor the patient accordingly for side effects on a weekly basis. Concurrent chemotherapy increases the complexity of this patient's treatment and  therefore this constitutes a special treatment procedure.  -----------------------------------  Eppie Gibson, MD

## 2019-06-17 ENCOUNTER — Ambulatory Visit (HOSPITAL_COMMUNITY): Payer: BC Managed Care – PPO

## 2019-06-17 NOTE — Progress Notes (Signed)
Nutrition Follow-up:  Patient with SCC of tongue s/p right excision and right neck dissection on 8/7 at [facility].  Patient to start chemo and radiation on 9/21.    Spoke with patient via phone for nutrition follow-up.  Patient reports that she is eating a well, still soft foods.  Reports that she has added meatloaf with gravy, toast, rice, sweet and sour chicken, frozen meal with chicken, veggies pasta and cream of chicken soup.  Continues to drink 2 ensure plus daily.    Medications: reviewed  Labs: reviewed  Anthropometrics:   Thinks weight is stable at 189 lb.     NUTRITION DIAGNOSIS: Trouble chewing and swallowing improved at this time   INTERVENTION:  Patient was not able to pick up case of ensure enlive on 9/4.  Will be available for pick up on 9/17.  Patient is aware. Encouraged patient to continue to focus on high calorie, high protein foods.   Answered questions regarding sugar and cancer and diary products and cancer.   Patient has contact information    MONITORING, EVALUATION, GOAL: oral intake, weight trends, treatment side effects   NEXT VISIT: phone f/u Friday, Sept 25  [name], [place], [place] Registered Dietitian [phone] (pager)

## 2019-06-21 ENCOUNTER — Other Ambulatory Visit (HOSPITAL_COMMUNITY): Payer: BC Managed Care – PPO | Admitting: General Practice

## 2019-06-23 ENCOUNTER — Inpatient Hospital Stay (HOSPITAL_COMMUNITY): Payer: BC Managed Care – PPO

## 2019-06-23 ENCOUNTER — Other Ambulatory Visit: Payer: Self-pay

## 2019-06-23 DIAGNOSIS — C021 Malignant neoplasm of border of tongue: Secondary | ICD-10-CM

## 2019-06-23 DIAGNOSIS — Z95828 Presence of other vascular implants and grafts: Secondary | ICD-10-CM

## 2019-06-23 DIAGNOSIS — Z79899 Other long term (current) drug therapy: Secondary | ICD-10-CM | POA: Diagnosis not present

## 2019-06-23 DIAGNOSIS — I10 Essential (primary) hypertension: Secondary | ICD-10-CM | POA: Diagnosis not present

## 2019-06-23 DIAGNOSIS — Z923 Personal history of irradiation: Secondary | ICD-10-CM | POA: Diagnosis not present

## 2019-06-23 DIAGNOSIS — K5900 Constipation, unspecified: Secondary | ICD-10-CM | POA: Diagnosis not present

## 2019-06-23 DIAGNOSIS — C023 Malignant neoplasm of anterior two-thirds of tongue, part unspecified: Secondary | ICD-10-CM

## 2019-06-23 DIAGNOSIS — Z5111 Encounter for antineoplastic chemotherapy: Secondary | ICD-10-CM | POA: Diagnosis present

## 2019-06-23 LAB — CBC WITH DIFFERENTIAL/PLATELET
Abs Immature Granulocytes: 0.01 10*3/uL (ref 0.00–0.07)
Basophils Absolute: 0.1 10*3/uL (ref 0.0–0.1)
Basophils Relative: 1 %
Eosinophils Absolute: 0.2 10*3/uL (ref 0.0–0.5)
Eosinophils Relative: 4 %
HCT: 45 % (ref 36.0–46.0)
Hemoglobin: 14.2 g/dL (ref 12.0–15.0)
Immature Granulocytes: 0 %
Lymphocytes Relative: 27 %
Lymphs Abs: 1.1 10*3/uL (ref 0.7–4.0)
MCH: 28 pg (ref 26.0–34.0)
MCHC: 31.6 g/dL (ref 30.0–36.0)
MCV: 88.8 fL (ref 80.0–100.0)
Monocytes Absolute: 0.3 10*3/uL (ref 0.1–1.0)
Monocytes Relative: 8 %
Neutro Abs: 2.5 10*3/uL (ref 1.7–7.7)
Neutrophils Relative %: 60 %
Platelets: 231 10*3/uL (ref 150–400)
RBC: 5.07 MIL/uL (ref 3.87–5.11)
RDW: 14.3 % (ref 11.5–15.5)
WBC: 4.1 10*3/uL (ref 4.0–10.5)
nRBC: 0 % (ref 0.0–0.2)

## 2019-06-23 LAB — COMPREHENSIVE METABOLIC PANEL
ALT: 22 U/L (ref 0–44)
AST: 24 U/L (ref 15–41)
Albumin: 4.2 g/dL (ref 3.5–5.0)
Alkaline Phosphatase: 55 U/L (ref 38–126)
Anion gap: 9 (ref 5–15)
BUN: 6 mg/dL — ABNORMAL LOW (ref 8–23)
CO2: 25 mmol/L (ref 22–32)
Calcium: 9.3 mg/dL (ref 8.9–10.3)
Chloride: 105 mmol/L (ref 98–111)
Creatinine, Ser: 1.06 mg/dL — ABNORMAL HIGH (ref 0.44–1.00)
GFR calc Af Amer: >60 mL/min (ref >60)
GFR calc non Af Amer: 54 mL/min — ABNORMAL LOW (ref >60)
Glucose, Bld: 92 mg/dL (ref 70–99)
Potassium: 3.7 mmol/L (ref 3.5–5.1)
Sodium: 139 mmol/L (ref 135–145)
Total Bilirubin: 0.6 mg/dL (ref 0.3–1.2)
Total Protein: 7.4 g/dL (ref 6.5–8.1)

## 2019-06-23 LAB — MAGNESIUM: Magnesium: 2.2 mg/dL (ref 1.7–2.4)

## 2019-06-23 NOTE — Progress Notes (Signed)

## 2019-06-24 ENCOUNTER — Telehealth: Payer: Self-pay | Admitting: *Deleted

## 2019-06-24 DIAGNOSIS — C023 Malignant neoplasm of anterior two-thirds of tongue, part unspecified: Secondary | ICD-10-CM | POA: Diagnosis not present

## 2019-06-24 NOTE — Telephone Encounter (Signed)
Oncology Nurse Navigator Documentation  Called Terri [name] to inquire:  Questions before next [facility] RT start.  She denied, voiced understanding I will meet with her for first tmt.  Able to arrive prior to 10:45 RT to meet with SLP Garald Balding.  She agreed to 10:00 appt, I encouraged 9:30 arrival for lobby registration so she can arrive to Radiation Waiting before 10:00.  She voiced understanding. She voiced understanding she can call me with questions/concerns.  Gayleen Orem, RN, BSN Head & Neck Oncology Nurse [place] at [place] [phone]

## 2019-06-27 ENCOUNTER — Encounter (HOSPITAL_COMMUNITY): Payer: Self-pay | Admitting: Hematology

## 2019-06-27 ENCOUNTER — Ambulatory Visit
Admission: RE | Admit: 2019-06-27 | Discharge: 2019-06-27 | Disposition: A | Discharge status: Home / Self Care | Payer: BC Managed Care – PPO | Source: Ambulatory Visit | Attending: Radiation Oncology | Admitting: Radiation Oncology

## 2019-06-27 ENCOUNTER — Encounter: Payer: Self-pay | Admitting: *Deleted

## 2019-06-27 ENCOUNTER — Inpatient Hospital Stay (HOSPITAL_COMMUNITY): Payer: BC Managed Care – PPO

## 2019-06-27 ENCOUNTER — Inpatient Hospital Stay (HOSPITAL_BASED_OUTPATIENT_CLINIC_OR_DEPARTMENT_OTHER): Payer: BC Managed Care – PPO | Admitting: Hematology

## 2019-06-27 ENCOUNTER — Other Ambulatory Visit: Payer: Self-pay

## 2019-06-27 VITALS — BP 144/74 | HR 74 | Resp 16

## 2019-06-27 VITALS — BP 149/72 | HR 83 | Temp 96.6°F | Resp 18 | Wt 188.6 lb

## 2019-06-27 DIAGNOSIS — C023 Malignant neoplasm of anterior two-thirds of tongue, part unspecified: Secondary | ICD-10-CM

## 2019-06-27 DIAGNOSIS — Z95828 Presence of other vascular implants and grafts: Secondary | ICD-10-CM

## 2019-06-27 MED ORDER — SODIUM CHLORIDE 0.9 % IV SOLN
40.0000 mg/m2 | Freq: Once | INTRAVENOUS | Status: AC
  Administered 2019-06-27: 79 mg via INTRAVENOUS
  Filled 2019-06-27: qty 79

## 2019-06-27 MED ORDER — SODIUM CHLORIDE 0.9 % IV SOLN
Freq: Once | INTRAVENOUS | Status: AC
  Administered 2019-06-27: 11:00:00 via INTRAVENOUS

## 2019-06-27 MED ORDER — PALONOSETRON HCL INJECTION 0.25 MG/5ML
0.2500 mg | Freq: Once | INTRAVENOUS | Status: AC
  Administered 2019-06-27: 0.25 mg via INTRAVENOUS
  Filled 2019-06-27: qty 5

## 2019-06-27 MED ORDER — SODIUM CHLORIDE 0.9 % IV SOLN
Freq: Once | INTRAVENOUS | Status: AC
  Administered 2019-06-27: 13:00:00 via INTRAVENOUS
  Filled 2019-06-27: qty 5

## 2019-06-27 MED ORDER — SODIUM CHLORIDE 0.9% FLUSH
10.0000 mL | INTRAVENOUS | Status: DC | PRN
  Administered 2019-06-27: 10 mL
  Filled 2019-06-27: qty 10

## 2019-06-27 MED ORDER — HEPARIN SOD (PORK) LOCK FLUSH 100 UNIT/ML IV SOLN
500.0000 [IU] | Freq: Once | INTRAVENOUS | Status: AC | PRN
  Administered 2019-06-27: 16:00:00 500 [IU]

## 2019-06-27 MED ORDER — POTASSIUM CHLORIDE 2 MEQ/ML IV SOLN
Freq: Once | INTRAVENOUS | Status: AC
  Administered 2019-06-27: 11:00:00 via INTRAVENOUS
  Filled 2019-06-27: qty 10

## 2019-06-27 NOTE — Patient Instructions (Signed)
[facility] at [facility] Discharge Instructions  You were seen today by Dr. Katragadda. He went over your recent lab results. He will see you back in 1 week for labs and follow up.   Thank you for choosing [facility] at [facility] to provide your oncology and hematology care.  To afford each patient quality time with our provider, please arrive at least 15 minutes before your scheduled appointment time.   If you have a lab appointment with the Cancer Center please come in thru the  Main Entrance and check in at the main information desk  You need to re-schedule your appointment should you arrive 10 or more minutes late.  We strive to give you quality time with our providers, and arriving late affects you and other patients whose appointments are after yours.  Also, if you no show three or more times for appointments you may be dismissed from the clinic at the providers discretion.     Again, thank you for choosing  Cancer Center.  Our hope is that these requests will decrease the amount of time that you wait before being seen by our physicians.       _____________________________________________________________  Should you have questions after your visit to  Cancer Center, please contact our office at [phone] between the hours of 8:00 a.m. and 4:30 p.m.  Voicemails left after 4:00 p.m. will not be returned until the following business day.  For prescription refill requests, have your pharmacy contact our office and allow 72 hours.    Cancer Center Support Programs:   > Cancer Support Group  2nd Tuesday of the month 1pm-2pm, Journey Room    

## 2019-06-27 NOTE — Progress Notes (Signed)
[name], [place] [address]   CLINIC:  Medical Oncology/Hematology  PCP:  [name], DO [address] [name] [address] [phone]   REASON FOR VISIT:  Follow-up for right tongue cancer.     INTERVAL HISTORY:  [name] 68 y.o. 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  She started radiation therapy in [name] under the direction of Dr. Isidore Moos today.  She is reporting appetite and energy levels at 75%.  No pain is reported.  She is accompanied by her son today.  She is able to eat most sorts of foods other than fried chicken and steaks.  She is gaining her weight back.    REVIEW OF SYSTEMS:  Review of Systems  All other systems reviewed and are negative.    PAST MEDICAL/SURGICAL HISTORY:  Past Medical History:  Diagnosis Date  . Hypertension   . Port-A-Cath in place 06/14/2019   Past Surgical History:  Procedure Laterality Date  . 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 Dr. Nicolette Bang at [facility]  . PORTACATH PLACEMENT Left 06/10/2019   Procedure: INSERTION PORT-A-CATH;  Surgeon: Aviva Signs, MD;  Location: [facility] ORS;  Service: General;  Laterality: Left;  pt knows to arrive at 6:15  . TUBAL LIGATION       SOCIAL HISTORY:  Social History   Socioeconomic History  . Marital status: Widowed    Spouse name: Not on file  . Number of children: 2  . Years of education: Not on file  . Highest education level: Not on file  Occupational History  . Not on file  Social Needs  . Financial resource strain: Not hard at all  . Food insecurity    Worry: Never true    Inability: Never true  . Transportation needs    Medical: No    Non-medical: No  Tobacco Use  . Smoking status: Never Smoker  . Smokeless tobacco: Never Used  Substance and Sexual Activity  . Alcohol use: Never    Frequency:  Never  . Drug use: Never  . Sexual activity: Not on file  Lifestyle  . Physical activity    Days per week: 0 days    Minutes per session: 0 min  . Stress: Only a little  Relationships  . Social connections    Talks on phone: More than three times a week    Gets together: More than three times a week    Attends religious service: More than 4 times per year    Active member of club or organization: Yes    Attends meetings of clubs or organizations: More than 4 times per year    Relationship status: Widowed  . Intimate partner violence    Fear of current or ex partner: No    Emotionally abused: No    Physically abused: No    Forced sexual activity: No  Other Topics Concern  . Not on file  Social History Narrative      Patient is a widow.   Patient lives with her 2 sons in [name], [place].   Patient is a non-smoker, nondrinker.  Patient has never used chew.  Patient does not use illicit drugs.    FAMILY HISTORY:  Family History  Problem Relation Age of Onset  . Heart failure Mother   . Heart failure Father  CURRENT MEDICATIONS:  Outpatient Encounter Medications as of 06/27/2019  Medication Sig  . amLODipine (NORVASC) 5 MG tablet Take 5 mg by mouth daily.  [name] CISPLATIN IV Inject into the vein once a week.  . lidocaine-prilocaine (EMLA) cream Apply small amount to port a cath site and cover with plastic wrap one hour prior to chemotherapy appointments.  . Multiple Vitamins-Minerals (MULTIVITAMIN WITH MINERALS) tablet Take 1 tablet by mouth daily.  [name] omeprazole (PRILOSEC) 40 MG capsule Take 40 mg by mouth daily.   [name] acetaminophen (TYLENOL) 500 MG tablet Take 500 mg by mouth every 8 (eight) hours as needed (pain).   . chlorhexidine (PERIDEX) 0.12 % solution Use as directed 15 mLs in the mouth or throat 2 (two) times daily. (Patient not taking: Reported on 06/27/2019)  . diclofenac (VOLTAREN) 75 MG EC tablet Take 75 mg by mouth 2 (two) times daily as needed (pain).   [name]  HYDROcodone-acetaminophen (NORCO) 5-325 MG tablet Take 1 tablet by mouth every 4 (four) hours as needed for moderate pain. (Patient not taking: Reported on 06/27/2019)  . prochlorperazine (COMPAZINE) 10 MG tablet Take 1 tablet (10 mg total) by mouth every 6 (six) hours as needed (Nausea or vomiting). (Patient not taking: Reported on 06/27/2019)  . [DISCONTINUED] hydrochlorothiazide (HYDRODIURIL) 25 MG tablet Take 25 mg by mouth daily.  . [DISCONTINUED] sodium fluoride (PREVIDENT 5000 PLUS) 1.1 % CREA dental cream Apply to tooth brush. Brush teeth for 2 minutes. Spit out excess-DO NOT swallow. DO NOT rinse afterwards. Repeat nightly.   No facility-administered encounter medications on file as of 06/27/2019.     ALLERGIES:  No Known Allergies   PHYSICAL EXAM:  ECOG Performance status: 0  Vitals:   06/27/19 1008  BP: (!) 149/72  Pulse: 83  Resp: 18  Temp: (!) 96.6 F (35.9 C)  SpO2: 100%   Filed Weights   06/27/19 1008  Weight: 188 lb 9.6 oz (85.5 kg)    Physical Exam Vitals signs reviewed.  Constitutional:      Appearance: Normal appearance.  HENT:     Mouth/Throat:     Comments: Right lateral tongue is well-healed.   Cardiovascular:     Rate and Rhythm: Normal rate and regular rhythm.     Heart sounds: Normal heart sounds.  Pulmonary:     Effort: Pulmonary effort is normal.     Breath sounds: Normal breath sounds.  Abdominal:     General: There is no distension.     Palpations: Abdomen is soft. There is no mass.  Musculoskeletal:        General: No swelling.  Lymphadenopathy:     Cervical: No cervical adenopathy.  Skin:    General: Skin is warm.  Neurological:     General: No focal deficit present.     Mental Status: She is alert and oriented to person, place, and time.  Psychiatric:        Mood and Affect: Mood normal.        Behavior: Behavior normal.   Neck incision site is also well-healed.   LABORATORY DATA:  I have reviewed the labs as listed.  CBC     Component Value Date/Time   WBC 4.1 06/23/2019 1121   RBC 5.07 06/23/2019 1121   HGB 14.2 06/23/2019 1121   HCT 45.0 06/23/2019 1121   PLT 231 06/23/2019 1121   MCV 88.8 06/23/2019 1121   MCH 28.0 06/23/2019 1121   MCHC 31.6 06/23/2019 1121   RDW 14.3 06/23/2019 1121  LYMPHSABS 1.1 06/23/2019 1121   MONOABS 0.3 06/23/2019 1121   EOSABS 0.2 06/23/2019 1121   BASOSABS 0.1 06/23/2019 1121   CMP Latest Ref Rng & Units 06/23/2019 05/30/2019 04/18/2019  Glucose 70 - 99 mg/dL 92 93 -  BUN 8 - 23 mg/dL 6(L) 11 -  Creatinine 0.44 - 1.00 mg/dL 1.06(H) 1.19(H) 1.30(H)  Sodium 135 - 145 mmol/L 139 135 -  Potassium 3.5 - 5.1 mmol/L 3.7 3.8 -  Chloride 98 - 111 mmol/L 105 101 -  CO2 22 - 32 mmol/L 25 22 -  Calcium 8.9 - 10.3 mg/dL 9.3 9.4 -  Total Protein 6.5 - 8.1 g/dL 7.4 7.8 -  Total Bilirubin 0.3 - 1.2 mg/dL 0.6 1.2 -  Alkaline Phos 38 - 126 U/L 55 54 -  AST 15 - 41 U/L 24 40 -  ALT 0 - 44 U/L 22 41 -       DIAGNOSTIC IMAGING:  I have independently reviewed the scans and discussed with the patient.   I have reviewed Venita Lick LPN's note and agree with the documentation.  I personally performed a face-to-face visit, made revisions and my assessment and plan is as follows.    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 -Pathology shows 4.2 cm tumor, margins negative, 17 mm depth of invasion, moderately to poorly differentiated, positive neural invasion, 2/17 lymph nodes positive, ECE positive, pT4a, PN 3B. - Will obtain P 16 staining results from [facility]. - Because of adverse features including ECE, positive perineural invasion, T4 primary and N3 disease, she was recommended systemic therapy with RT as a category 1 recommendation.  - She started IMRT, 60 Gy in 30 fractions today. - She has a port placed.  We talked about giving her systemic chemotherapy with weekly cisplatin for 6 weeks.  She is not a candidate for high-dose cisplatin based on elevated creatinine. - We talked about side effects in detail.  She will proceed with first chemotherapy today with pre-and post hydration.  She will come back for hydration tomorrow.  She was encouraged to drink lots of fluids.  2.  Hypertension: -She is taking Norvasc 5 mg daily.  HCTZ 25 mg is on hold.  3.  Nutrition: -She is able to eat most kinds of foods.  She has difficulty eating fried chicken and steak.  Total time spent is 40 minutes with more than 50% of the time spent face-to-face discussing treatment plan, side effects, counseling and coordination of care.    Orders placed this encounter:  Orders Placed This Encounter  Procedures  . CBC with Differential/Platelet  . Comprehensive metabolic panel  . Magnesium      Derek Jack, MD [place] [phone]

## 2019-06-27 NOTE — Assessment & Plan Note (Signed)
1.  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 -Pathology shows 4.2 cm tumor, margins negative, 17 mm depth of invasion, moderately to poorly differentiated, positive neural invasion, 2/17 lymph nodes positive, ECE positive, pT4a, PN 3B. - Will obtain P 16 staining results from [facility]. - Because of adverse features including ECE, positive perineural invasion, T4 primary and N3 disease, she was recommended systemic therapy with RT as a category 1 recommendation. - She started IMRT, 60 Gy in 30 fractions today. - She has a port placed.  We talked about giving her systemic chemotherapy with weekly cisplatin for 6 weeks.  She is not a candidate for high-dose cisplatin based on elevated creatinine. - We talked about side effects in detail.  She will proceed with first chemotherapy today with pre-and post hydration.  She will come back for hydration tomorrow.  She was encouraged to drink lots of fluids.  2.  Hypertension: -She is taking Norvasc 5 mg daily.  HCTZ 25 mg is on hold.  3.  Nutrition: -She is able to eat most kinds of foods.  She has difficulty eating fried chicken and steak.

## 2019-06-27 NOTE — Patient Instructions (Signed)
[facility] Discharge Instructions for Patients Receiving Chemotherapy  Today you received the following chemotherapy agents   To help prevent nausea and vomiting after your treatment, we encourage you to take your nausea medication   If you develop nausea and vomiting that is not controlled by your nausea medication, call the clinic.   BELOW ARE SYMPTOMS THAT SHOULD BE REPORTED IMMEDIATELY:  *FEVER GREATER THAN 100.5 F  *CHILLS WITH OR WITHOUT FEVER  NAUSEA AND VOMITING THAT IS NOT CONTROLLED WITH YOUR NAUSEA MEDICATION  *UNUSUAL SHORTNESS OF BREATH  *UNUSUAL BRUISING OR BLEEDING  TENDERNESS IN MOUTH AND THROAT WITH OR WITHOUT PRESENCE OF ULCERS  *URINARY PROBLEMS  *BOWEL PROBLEMS  UNUSUAL RASH Items with * indicate a potential emergency and should be followed up as soon as possible.  Feel free to call the clinic should you have any questions or concerns. The clinic phone number is [phone].  Please show the CHEMO ALERT CARD at check-in to the Emergency Department and triage nurse.   

## 2019-06-28 ENCOUNTER — Inpatient Hospital Stay (HOSPITAL_COMMUNITY): Payer: BC Managed Care – PPO

## 2019-06-28 ENCOUNTER — Ambulatory Visit
Admission: RE | Admit: 2019-06-28 | Discharge: 2019-06-28 | Disposition: A | Discharge status: Home / Self Care | Payer: BC Managed Care – PPO | Source: Ambulatory Visit | Attending: Radiation Oncology | Admitting: Radiation Oncology

## 2019-06-28 ENCOUNTER — Ambulatory Visit: Payer: BC Managed Care – PPO

## 2019-06-28 ENCOUNTER — Other Ambulatory Visit: Payer: Self-pay

## 2019-06-28 ENCOUNTER — Encounter: Payer: Self-pay | Admitting: *Deleted

## 2019-06-28 VITALS — BP 127/61 | HR 64 | Temp 96.7°F | Resp 18

## 2019-06-28 DIAGNOSIS — C021 Malignant neoplasm of border of tongue: Secondary | ICD-10-CM

## 2019-06-28 DIAGNOSIS — R1310 Dysphagia, unspecified: Secondary | ICD-10-CM

## 2019-06-28 DIAGNOSIS — R293 Abnormal posture: Secondary | ICD-10-CM | POA: Diagnosis not present

## 2019-06-28 DIAGNOSIS — C023 Malignant neoplasm of anterior two-thirds of tongue, part unspecified: Secondary | ICD-10-CM | POA: Diagnosis not present

## 2019-06-28 MED ORDER — HEPARIN SOD (PORK) LOCK FLUSH 100 UNIT/ML IV SOLN
500.0000 [IU] | Freq: Once | INTRAVENOUS | Status: AC | PRN
  Administered 2019-06-28: 500 [IU]

## 2019-06-28 MED ORDER — SODIUM CHLORIDE 0.9 % IV SOLN
Freq: Once | INTRAVENOUS | Status: AC
  Administered 2019-06-28: 12:00:00 via INTRAVENOUS
  Filled 2019-06-28: qty 10

## 2019-06-28 MED ORDER — SODIUM CHLORIDE 0.9% FLUSH
10.0000 mL | Freq: Once | INTRAVENOUS | Status: AC | PRN
  Administered 2019-06-28: 10 mL

## 2019-06-28 MED ORDER — SONAFINE EX EMUL
1.0000 "application " | Freq: Once | CUTANEOUS | Status: AC
  Administered 2019-06-28: 1 via TOPICAL

## 2019-06-28 NOTE — Progress Notes (Signed)
Oncology Nurse Navigator Documentation  To provide support, encouragement and care continuity, met with Terri [name] for her initial  RT.    I reviewed the 2-step treatment process, answered questions.   [name] completed treatment without difficulty, denied questions/concerns.  I reviewed the registration/arrival procedure for subsequent treatments. Escorted her to [facility] for weekly PET with Dr. Isidore Moos. I reminded her of 9:30 arrival tomorrow for H&N MDC.  She voiced understanding.  [place]ri Orem, RN, BSN Head & Neck Oncology Nurse [place] at [place] [phone]

## 2019-06-28 NOTE — Progress Notes (Signed)

## 2019-06-28 NOTE — Progress Notes (Signed)
Oncology Nurse Navigator Documentation  Escorted Terri [name] to RadOnc s/p XRT for H&N MDC. She was seen by SLP [name].  Gayleen Orem, RN, BSN Head & Neck Oncology Nurse [place] at [place] [phone]

## 2019-06-28 NOTE — Progress Notes (Signed)
Patient has no complaints today other thatn feeling tired. Proceed as planned.  Hydration fluids given today per MD.  Patient tolerated it well without problems. Vitals stable and discharged home from clinic ambulatory. Follow up as scheduled. Patient does not feel like she needs fluids for tomorrow. Will let MD know.

## 2019-06-29 ENCOUNTER — Ambulatory Visit
Admission: RE | Admit: 2019-06-29 | Discharge: 2019-06-29 | Disposition: A | Discharge status: Home / Self Care | Payer: BC Managed Care – PPO | Source: Ambulatory Visit | Attending: Radiation Oncology | Admitting: Radiation Oncology

## 2019-06-29 ENCOUNTER — Other Ambulatory Visit: Payer: Self-pay

## 2019-06-29 ENCOUNTER — Ambulatory Visit (HOSPITAL_COMMUNITY): Payer: BC Managed Care – PPO

## 2019-06-29 DIAGNOSIS — C023 Malignant neoplasm of anterior two-thirds of tongue, part unspecified: Secondary | ICD-10-CM | POA: Diagnosis not present

## 2019-06-29 NOTE — Therapy (Signed)
[facility] [address] [place], [name], [address] Phone: [phone]   Fax:  [phone]  Speech Language Pathology Evaluation  Patient Details  Name: Terri [name] MRN: [id number] Date of Birth: 02/17/1951 Referring Provider (SLP): Eppie Gibson, MD   Encounter Date: 06/28/2019  End of Session - 06/29/19 9470    Visit Number  1    Number of Visits  3    Date for SLP Re-Evaluation  09/26/19   90 days   SLP Start Time  1000    SLP Stop Time   9628    SLP Time Calculation (min)  45 min    Activity Tolerance  Patient tolerated treatment well       Past Medical History:  Diagnosis Date  . Hypertension   . Port-A-Cath in place 06/14/2019    Past Surgical History:  Procedure Laterality Date  . 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 Dr. Nicolette Bang at [facility]  . PORTACATH PLACEMENT Left 06/10/2019   Procedure: INSERTION PORT-A-CATH;  Surgeon: Aviva Signs, MD;  Location: [facility];  Service: General;  Laterality: Left;  pt knows to arrive at 6:15  . TUBAL LIGATION      There were no vitals filed for this visit.  Subjective Assessment - 06/28/19 1022    Subjective  Pt denies overt s/s swallowing difficulty.    Currently in Pain?  No/denies         SLP Evaluation [facility] - 06/29/19 0001      SLP Visit Information   SLP Received On  06/28/19    Referring Provider (SLP)  Eppie Gibson, MD    Onset Date  May 2020      Subjective   Patient/Family Stated Goal  Maintain WNL swallow function      General Information   HPI  Pt with pain in rt jaw in May and referred to [facility], biopsy rt ventral tongue revealed mod well-diff SCC. Partial glossectomy by Dr. Nicolette Bang 05-13-19. Pt has been eating soft foods since that time. PEG not planned. Rad and chemo began 06-27-19.      Oral Motor/Sensory Function   Overall Oral Motor/Sensory Function   Impaired    Lingual ROM  Reduced left    Lingual Symmetry  --   deviated to rt   Lingual Strength  Reduced   difficult to assess rt strength due to decr'd mobility/ROM   Lingual Coordination  Reduced    Overall Oral Motor/Sensory Function  Limited pt unmasking due to lingual sx site needed to be visualized, and lingual mobility assessed. Decr'd mobility of lingual structure appreciated post rt partial glossectomy. Pt reports needing to perform oral rinses post - POs. SLP agrees with this Pt also reports needing to manipulate residual bolus in rt lateral labial sulcus at times due to decr'd lingual mobility.       Motor Speech   Overall Motor Speech  Appears within functional limits for tasks assessed        Because data states the risk for dysphagia during and after radiation treatment is high due to undergoing radiation tx, SLP taught pt about the possibility of reduced/limited ability for PO intake during rad tx. SLP encouraged pt to continue swallowing POs as far into rad tx as possible, even ingesting POs and/or completing HEP shortly after administration of pain meds.   SLP educated pt re: changes to swallowing musculature after rad tx,  and why adherence to dysphagia HEP provided today and PO consumption was necessary to inhibit muscular disuse atrophy and to reduce muscle fibrosis following rad tx. Pt demonstrated understanding of these things to SLP.    SLP then developed a HEP for pt and pt was instructed how to perform exercises involving lingual, vocal, and pharyngeal strengthening. SLP performed each exercise and pt return demonstrated each exercise. SLP ensured pt performance was correct prior to moving on to next exercise. Pt was instructed to complete this program twice a day, until 6 months after her last rad tx, then x2 a week after that.                 SLP Education - 06/29/19 0936    Education Details  HEP procedure, late effects head/neck radiation, rationale  for HEP, take pain meds and then eat and do HEP    Person(s) Educated  Patient    Methods  Explanation;Demonstration;Verbal cues;Handout    Comprehension  Verbalized understanding;Returned demonstration;Verbal cues required;Need further instruction       SLP Short Term Goals - 06/29/19 0944      SLP SHORT TERM GOAL #1   Title  Pt will demo knowledge of rationale for HEP completion    Time  1    Period  --   visit, for all STGs   Status  New      SLP SHORT TERM GOAL #2   Title  Pt will complete HEP with rare min A    Time  2    Status  New      SLP SHORT TERM GOAL #3   Title  Pt will tell SLP how a food journal can hasten/facilitate return to more normalized diet    Time  2    Status  New       SLP Long Term Goals - 06/29/19 4944      SLP LONG TERM GOAL #1   Title  Pt will complete HEP with modified independence over 3 visits    Time  3    Period  --   visits, for all LTGS   Status  New      SLP LONG TERM GOAL #2   Title  Pt will tell SLP 3 overt s/s aspiration PNA with modified independence    Time  4    Status  New       Plan - 06/29/19 9675    Clinical Impression Statement  Pt presents today with WNL/WFL swallowing ability pre-rad tx. Data suggests that as pts progress through rad or chemorad therapy that their swallowing ability will decrease. Also, WNL swallowing is threatened by muscle fibrosis that will likely develop after rad/chemorad is completed. Therefore, skilled ST would be beneficial to the pt in order to regularly assess pt's safety with POs and/or need for instrumental swallow assessment, as well as to assess proper completion of HEP.    Speech Therapy Frequency  --   once every approx 4 weeks   Duration  --   3 therapy visits/90 days   Treatment/Interventions  Aspiration precaution training;Pharyngeal strengthening exercises;Diet toleration management by SLP;Cueing hierarchy;Trials of upgraded texture/liquids;Compensatory techniques;Patient/family  education;SLP instruction and feedback    Potential to [place]  provided today    Consulted and Agree with Plan of Care  Patient       Patient will benefit from skilled therapeutic intervention in order to improve the following  deficits and impairments:   Dysphagia, unspecified type    Problem List Patient Active Problem List   Diagnosis Date Noted  . Port-A-Cath in place 06/14/2019  . Malignant neoplasm of tip and lateral border of tongue ([place])   . Cancer of lateral margin of anterior two-thirds of tongue ([place]) 04/11/2019    [facility] ,[place], [place]  06/29/2019, 9:58 AM  [facility] [address] [place], [name], [address] Phone: [phone]   Fax:  [phone]  Name: Micaila Ziemba MRN: [id number] Date of Birth: 1951/07/24

## 2019-06-29 NOTE — Patient Instructions (Signed)
SWALLOWING EXERCISES Do these 6 of the 7 days per week until 6 weeks after your last day of radiation, then 2 times per week afterwards  1. Effortful Swallows - Press your tongue against the roof of your mouth for 3 seconds, then squeeze          the muscles in your neck while you swallow your saliva or a sip of water - Repeat 20 times, 2-3 times a day, and use whenever you eat or drink  2. Masako Swallow - swallow with your tongue sticking out - Stick tongue out past your teeth and gently bite tongue with your teeth - Swallow, while holding your tongue with your teeth - Repeat 20 times, 2-3 times a day *use a wet spoon if your mouth gets dry*  3. Pitch Raise - Repeat "he", once per second in as high of a pitch as you can - Repeat 20 times, 2-3 times a day  4. Mendelsohn Maneuver - "half swallow" exercise - Start to swallow, and keep your Adam's apple up by squeezing hard with the            muscles of the throat - Hold the squeeze for 5-7 seconds and then relax - Repeat 20 times, 2-3 times a day *use a wet spoon if your mouth gets dry*  5. Tongue Stretch/Teeth Clean - Move your tongue around the pocket between your gums and teeth, clockwise and then counter-clockwise - Repeat on the back side, clockwise and then counter-clockwise - Repeat 15-20 times, 2-3 times a day  6. Chin pushback - Open your mouth  - Place your fist UNDER your chin near your neck, and push back with your fist for 5 seconds - Repeat 10 times, 2-3 times a day        7  Super swallow - take a breath and hold it  - bear down  - swallow and cough!  - repeat 10 times, twice a day

## 2019-06-30 ENCOUNTER — Other Ambulatory Visit: Payer: Self-pay

## 2019-06-30 ENCOUNTER — Ambulatory Visit
Admission: RE | Admit: 2019-06-30 | Discharge: 2019-06-30 | Disposition: A | Discharge status: Home / Self Care | Payer: BC Managed Care – PPO | Source: Ambulatory Visit | Attending: Radiation Oncology | Admitting: Radiation Oncology

## 2019-06-30 DIAGNOSIS — C023 Malignant neoplasm of anterior two-thirds of tongue, part unspecified: Secondary | ICD-10-CM | POA: Diagnosis not present

## 2019-07-01 ENCOUNTER — Ambulatory Visit
Admission: RE | Admit: 2019-07-01 | Discharge: 2019-07-01 | Disposition: A | Discharge status: Home / Self Care | Payer: BC Managed Care – PPO | Source: Ambulatory Visit | Attending: Radiation Oncology | Admitting: Radiation Oncology

## 2019-07-01 ENCOUNTER — Ambulatory Visit (HOSPITAL_COMMUNITY): Payer: BC Managed Care – PPO

## 2019-07-01 ENCOUNTER — Other Ambulatory Visit: Payer: Self-pay

## 2019-07-01 DIAGNOSIS — C023 Malignant neoplasm of anterior two-thirds of tongue, part unspecified: Secondary | ICD-10-CM | POA: Diagnosis not present

## 2019-07-01 NOTE — Progress Notes (Signed)
Nutrition Follow-up:  Patient with SCC of tongue s/p right excision and right neck dissection on 8/7 at [facility].  Patient has completed first week of chemotherapy and radiation therapy.    Spoke with patient via phone.  Reports that she is feeling ok.  Reports that she has had some mild constipation and has been taking stool softners feels like she will have a bowel movement today.  Reports that overall she has been eating about the same.  Continues to drink ensure plus daily.    Reports that she is doing SLP exercises.    Medications: reviewed  Labs: creatinine 1.06  Anthropometrics:   Weight stable 188 lb on 9/21, last weight 189 lb  NUTRITION DIAGNOSIS: Trouble chewing and swallowing stable   INTERVENTION:  Reviewed high calorie, high protein soft foods. Encouraged continued use of oral nutrition supplements. Patient has contact information    MONITORING, EVALUATION, GOAL: oral intake, weight trends. Treatment side effects   NEXT VISIT: phone f/u Friday, Oct 2nd  [place] Zenia Resides, [place], [place] Registered Dietitian [phone] (pager)

## 2019-07-04 ENCOUNTER — Inpatient Hospital Stay (HOSPITAL_COMMUNITY): Payer: BC Managed Care – PPO

## 2019-07-04 ENCOUNTER — Other Ambulatory Visit: Payer: Self-pay

## 2019-07-04 ENCOUNTER — Inpatient Hospital Stay (HOSPITAL_BASED_OUTPATIENT_CLINIC_OR_DEPARTMENT_OTHER): Payer: BC Managed Care – PPO | Admitting: Hematology

## 2019-07-04 ENCOUNTER — Encounter (HOSPITAL_COMMUNITY): Payer: Self-pay | Admitting: Hematology

## 2019-07-04 ENCOUNTER — Ambulatory Visit
Admission: RE | Admit: 2019-07-04 | Discharge: 2019-07-04 | Disposition: A | Discharge status: Home / Self Care | Payer: BC Managed Care – PPO | Source: Ambulatory Visit | Attending: Radiation Oncology | Admitting: Radiation Oncology

## 2019-07-04 VITALS — BP 116/60 | HR 63 | Resp 16

## 2019-07-04 DIAGNOSIS — Z95828 Presence of other vascular implants and grafts: Secondary | ICD-10-CM

## 2019-07-04 DIAGNOSIS — C023 Malignant neoplasm of anterior two-thirds of tongue, part unspecified: Secondary | ICD-10-CM | POA: Diagnosis not present

## 2019-07-04 LAB — CBC WITH DIFFERENTIAL/PLATELET
Abs Immature Granulocytes: 0.01 10*3/uL (ref 0.00–0.07)
Basophils Absolute: 0 10*3/uL (ref 0.0–0.1)
Basophils Relative: 1 %
Eosinophils Absolute: 0.1 10*3/uL (ref 0.0–0.5)
Eosinophils Relative: 3 %
HCT: 41.9 % (ref 36.0–46.0)
Hemoglobin: 13.4 g/dL (ref 12.0–15.0)
Immature Granulocytes: 0 %
Lymphocytes Relative: 19 %
Lymphs Abs: 0.8 10*3/uL (ref 0.7–4.0)
MCH: 28.1 pg (ref 26.0–34.0)
MCHC: 32 g/dL (ref 30.0–36.0)
MCV: 87.8 fL (ref 80.0–100.0)
Monocytes Absolute: 0.4 10*3/uL (ref 0.1–1.0)
Monocytes Relative: 10 %
Neutro Abs: 2.8 10*3/uL (ref 1.7–7.7)
Neutrophils Relative %: 67 %
Platelets: 220 10*3/uL (ref 150–400)
RBC: 4.77 MIL/uL (ref 3.87–5.11)
RDW: 13.3 % (ref 11.5–15.5)
WBC: 4.2 10*3/uL (ref 4.0–10.5)
nRBC: 0 % (ref 0.0–0.2)

## 2019-07-04 LAB — COMPREHENSIVE METABOLIC PANEL
ALT: 39 U/L (ref 0–44)
AST: 24 U/L (ref 15–41)
Albumin: 4 g/dL (ref 3.5–5.0)
Alkaline Phosphatase: 61 U/L (ref 38–126)
Anion gap: 10 (ref 5–15)
BUN: 13 mg/dL (ref 8–23)
CO2: 25 mmol/L (ref 22–32)
Calcium: 9.2 mg/dL (ref 8.9–10.3)
Chloride: 97 mmol/L — ABNORMAL LOW (ref 98–111)
Creatinine, Ser: 0.97 mg/dL (ref 0.44–1.00)
GFR calc Af Amer: >60 mL/min (ref >60)
GFR calc non Af Amer: >60 mL/min (ref >60)
Glucose, Bld: 90 mg/dL (ref 70–99)
Potassium: 3.9 mmol/L (ref 3.5–5.1)
Sodium: 132 mmol/L — ABNORMAL LOW (ref 135–145)
Total Bilirubin: 0.9 mg/dL (ref 0.3–1.2)
Total Protein: 7.3 g/dL (ref 6.5–8.1)

## 2019-07-04 LAB — MAGNESIUM: Magnesium: 2.1 mg/dL (ref 1.7–2.4)

## 2019-07-04 MED ORDER — SODIUM CHLORIDE 0.9 % IV SOLN
Freq: Once | INTRAVENOUS | Status: AC
  Administered 2019-07-04: 13:00:00 via INTRAVENOUS
  Filled 2019-07-04: qty 5

## 2019-07-04 MED ORDER — POTASSIUM CHLORIDE 2 MEQ/ML IV SOLN
Freq: Once | INTRAVENOUS | Status: AC
  Administered 2019-07-04: 11:00:00 via INTRAVENOUS
  Filled 2019-07-04: qty 10

## 2019-07-04 MED ORDER — HEPARIN SOD (PORK) LOCK FLUSH 100 UNIT/ML IV SOLN
500.0000 [IU] | Freq: Once | INTRAVENOUS | Status: AC | PRN
  Administered 2019-07-04: 500 [IU]

## 2019-07-04 MED ORDER — PALONOSETRON HCL INJECTION 0.25 MG/5ML
0.2500 mg | Freq: Once | INTRAVENOUS | Status: AC
  Administered 2019-07-04: 0.25 mg via INTRAVENOUS
  Filled 2019-07-04: qty 5

## 2019-07-04 MED ORDER — SODIUM CHLORIDE 0.9 % IV SOLN
Freq: Once | INTRAVENOUS | Status: AC
  Administered 2019-07-04: 13:00:00 via INTRAVENOUS

## 2019-07-04 MED ORDER — SODIUM CHLORIDE 0.9 % IV SOLN
40.0000 mg/m2 | Freq: Once | INTRAVENOUS | Status: AC
  Administered 2019-07-04: 79 mg via INTRAVENOUS
  Filled 2019-07-04: qty 79

## 2019-07-04 MED ORDER — SODIUM CHLORIDE 0.9% FLUSH
10.0000 mL | INTRAVENOUS | Status: DC | PRN
  Administered 2019-07-04: 10 mL
  Filled 2019-07-04: qty 10

## 2019-07-04 NOTE — Assessment & Plan Note (Signed)
1.  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 -Pathology shows 4.2 cm tumor, margins negative, 17 mm depth of invasion, moderately to poorly differentiated, positive neural invasion, 2/17 lymph nodes positive, ECE positive, pT4a, PN 3B. - Will obtain P 16 staining results from [facility]. - Because of adverse features including ECE, positive perineural invasion, T4 primary and N3 disease, she was recommended systemic therapy with RT as a category 1 recommendation. - IMRT 60 Gy in 30 fractions started on 06/27/2019. -She received first cycle of cisplatin weekly on 06/27/2019.  She denied any GI side effects other than constipation. -She reportedly lost appetite last Thursday.  She is drinking Ensure 2 cans/day. -She will proceed with her cycle 2 today.  We have reviewed her labs.  She was encouraged to drink lots of fluids.  2.  Hypertension: -She is taking Norvasc 5 mg daily.  HCTZ 25 mg is on hold.  3.  Nutrition: -Her appetite decreased last week.  She is drinking 2 ensures per day.  She lost 6 pounds from last week.  We will keep a close eye on her.

## 2019-07-04 NOTE — Patient Instructions (Signed)
[facility] Discharge Instructions for Patients Receiving Chemotherapy  Today you received the following chemotherapy agents   To help prevent nausea and vomiting after your treatment, we encourage you to take your nausea medication   If you develop nausea and vomiting that is not controlled by your nausea medication, call the clinic.   BELOW ARE SYMPTOMS THAT SHOULD BE REPORTED IMMEDIATELY:  *FEVER GREATER THAN 100.5 F  *CHILLS WITH OR WITHOUT FEVER  NAUSEA AND VOMITING THAT IS NOT CONTROLLED WITH YOUR NAUSEA MEDICATION  *UNUSUAL SHORTNESS OF BREATH  *UNUSUAL BRUISING OR BLEEDING  TENDERNESS IN MOUTH AND THROAT WITH OR WITHOUT PRESENCE OF ULCERS  *URINARY PROBLEMS  *BOWEL PROBLEMS  UNUSUAL RASH Items with * indicate a potential emergency and should be followed up as soon as possible.  Feel free to call the clinic should you have any questions or concerns. The clinic phone number is [phone].  Please show the CHEMO ALERT CARD at check-in to the Emergency Department and triage nurse.   

## 2019-07-04 NOTE — Patient Instructions (Addendum)
[facility] at [facility] Discharge Instructions  You were seen today by Dr. Katragadda. He went over your recent lab results. He will see you back in 1 week for labs and follow up.   Thank you for choosing [facility] at [facility] to provide your oncology and hematology care.  To afford each patient quality time with our provider, please arrive at least 15 minutes before your scheduled appointment time.   If you have a lab appointment with the Cancer Center please come in thru the  Main Entrance and check in at the main information desk  You need to re-schedule your appointment should you arrive 10 or more minutes late.  We strive to give you quality time with our providers, and arriving late affects you and other patients whose appointments are after yours.  Also, if you no show three or more times for appointments you may be dismissed from the clinic at the providers discretion.     Again, thank you for choosing [facility].  Our hope is that these requests will decrease the amount of time that you wait before being seen by our physicians.       _____________________________________________________________  Should you have questions after your visit to [facility], please contact our office at [phone] between the hours of 8:00 a.m. and 4:30 p.m.  Voicemails left after 4:00 p.m. will not be returned until the following business day.  For prescription refill requests, have your pharmacy contact our office and allow 72 hours.    Cancer Center Support Programs:   > Cancer Support Group  2nd Tuesday of the month 1pm-2pm, Journey Room    

## 2019-07-04 NOTE — Progress Notes (Signed)
Terri [name], [place] [address]   CLINIC:  Medical Oncology/Hematology  PCP:  [name], DO [address] [name] [address] [phone]   REASON FOR VISIT:  Follow-up for right tongue cancer.     INTERVAL HISTORY:  Terri [name] 68 y.o. female seen for follow-up of tongue cancer.  She was started on chemotherapy with cisplatin weekly on 06/27/2019.  She reports increased thickness of her saliva.  Also felt some soreness on the tongue.  Appetite is 25%.  Energy levels are 75%.  Appetite levels really dropped last Thursday.  She is drinking Ensure 2 cans/day.  She lost 6 pounds.  She had some constipation which is stable.    REVIEW OF SYSTEMS:  Review of Systems  Constitutional: Positive for unexpected weight change.  Gastrointestinal: Positive for constipation.  All other systems reviewed and are negative.    PAST MEDICAL/SURGICAL HISTORY:  Past Medical History:  Diagnosis Date  . Hypertension   . Port-A-Cath in place 06/14/2019   Past Surgical History:  Procedure Laterality Date  . 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 Dr. Nicolette Bang at [facility]  . PORTACATH PLACEMENT Left 06/10/2019   Procedure: INSERTION PORT-A-CATH;  Surgeon: Aviva Signs, MD;  Location: [facility];  Service: General;  Laterality: Left;  pt knows to arrive at 6:15  . TUBAL LIGATION       SOCIAL HISTORY:  Social History   Socioeconomic History  . Marital status: Widowed    Spouse name: Not on file  . Number of children: 2  . Years of education: Not on file  . Highest education level: Not on file  Occupational History  . Not on file  Social Needs  . Financial resource strain: Not hard at all  . Food insecurity    Worry: Never true    Inability: Never true  . Transportation needs    Medical: No    Non-medical: No  Tobacco Use  . Smoking status: Never  Smoker  . Smokeless tobacco: Never Used  Substance and Sexual Activity  . Alcohol use: Never    Frequency: Never  . Drug use: Never  . Sexual activity: Not on file  Lifestyle  . Physical activity    Days per week: 0 days    Minutes per session: 0 min  . Stress: Only a little  Relationships  . Social connections    Talks on phone: More than three times a week    Gets together: More than three times a week    Attends religious service: More than 4 times per year    Active member of club or organization: Yes    Attends meetings of clubs or organizations: More than 4 times per year    Relationship status: Widowed  . Intimate partner violence    Fear of current or ex partner: No    Emotionally abused: No    Physically abused: No    Forced sexual activity: No  Other Topics Concern  . Not on file  Social History Narrative      Patient is a widow.   Patient lives with her 2 sons in [place], [place].   Patient is a non-smoker, nondrinker.  Patient has never used chew.  Patient does not use illicit drugs.    FAMILY HISTORY:  Family History  Problem Relation Age of Onset  . Heart failure  Mother   . Heart failure Father     CURRENT MEDICATIONS:  Outpatient Encounter Medications as of 07/04/2019  Medication Sig  . amLODipine (NORVASC) 5 MG tablet Take 5 mg by mouth daily.  [name] CISPLATIN IV Inject into the vein once a week.  . lidocaine-prilocaine (EMLA) cream Apply small amount to port a cath site and cover with plastic wrap one hour prior to chemotherapy appointments.  . Multiple Vitamins-Minerals (MULTIVITAMIN WITH MINERALS) tablet Take 1 tablet by mouth daily.  [name] omeprazole (PRILOSEC) 40 MG capsule Take 40 mg by mouth daily.   [name] acetaminophen (TYLENOL) 500 MG tablet Take 500 mg by mouth every 8 (eight) hours as needed (pain).   . chlorhexidine (PERIDEX) 0.12 % solution Use as directed 15 mLs in the mouth or throat 2 (two) times daily. (Patient not taking: Reported on 06/27/2019)   . diclofenac (VOLTAREN) 75 MG EC tablet Take 75 mg by mouth 2 (two) times daily as needed (pain).   [name] HYDROcodone-acetaminophen (NORCO) 5-325 MG tablet Take 1 tablet by mouth every 4 (four) hours as needed for moderate pain. (Patient not taking: Reported on 06/27/2019)  . prochlorperazine (COMPAZINE) 10 MG tablet Take 1 tablet (10 mg total) by mouth every 6 (six) hours as needed (Nausea or vomiting). (Patient not taking: Reported on 06/27/2019)   No facility-administered encounter medications on file as of 07/04/2019.     ALLERGIES:  No Known Allergies   PHYSICAL EXAM:  ECOG Performance status: 0  Vitals:   07/04/19 1015  BP: 127/70  Pulse: 74  Resp: 18  Temp: (!) 97.1 F (36.2 C)  SpO2: 100%   Filed Weights   07/04/19 1015  Weight: 181 lb 3.2 oz (82.2 kg)    Physical Exam Vitals signs reviewed.  Constitutional:      Appearance: Normal appearance.  HENT:     Mouth/Throat:     Comments: Right lateral tongue is well-healed.   Cardiovascular:     Rate and Rhythm: Normal rate and regular rhythm.     Heart sounds: Normal heart sounds.  Pulmonary:     Effort: Pulmonary effort is normal.     Breath sounds: Normal breath sounds.  Abdominal:     General: There is no distension.     Palpations: Abdomen is soft. There is no mass.  Musculoskeletal:        General: No swelling.  Lymphadenopathy:     Cervical: No cervical adenopathy.  Skin:    General: Skin is warm.  Neurological:     General: No focal deficit present.     Mental Status: She is alert and oriented to person, place, and time.  Psychiatric:        Mood and Affect: Mood normal.        Behavior: Behavior normal.   Neck incision site is also well-healed.   LABORATORY DATA:  I have reviewed the labs as listed.  CBC    Component Value Date/Time   WBC 4.2 07/04/2019 1025   RBC 4.77 07/04/2019 1025   HGB 13.4 07/04/2019 1025   HCT 41.9 07/04/2019 1025   PLT 220 07/04/2019 1025   MCV 87.8 07/04/2019 1025    MCH 28.1 07/04/2019 1025   MCHC 32.0 07/04/2019 1025   RDW 13.3 07/04/2019 1025   LYMPHSABS 0.8 07/04/2019 1025   MONOABS 0.4 07/04/2019 1025   EOSABS 0.1 07/04/2019 1025   BASOSABS 0.0 07/04/2019 1025   CMP Latest Ref Rng & Units 07/04/2019 06/23/2019 05/30/2019  Glucose 70 -  99 mg/dL 90 92 93  BUN 8 - 23 mg/dL 13 6(L) 11  Creatinine 0.44 - 1.00 mg/dL 0.97 1.06(H) 1.19(H)  Sodium 135 - 145 mmol/L 132(L) 139 135  Potassium 3.5 - 5.1 mmol/L 3.9 3.7 3.8  Chloride 98 - 111 mmol/L 97(L) 105 101  CO2 22 - 32 mmol/L 25 25 22   Calcium 8.9 - 10.3 mg/dL 9.2 9.3 9.4  Total Protein 6.5 - 8.1 g/dL 7.3 7.4 7.8  Total Bilirubin 0.3 - 1.2 mg/dL 0.9 0.6 1.2  Alkaline Phos 38 - 126 U/L 61 55 54  AST 15 - 41 U/L 24 24 40  ALT 0 - 44 U/L 39 22 41       DIAGNOSTIC IMAGING:  I have independently reviewed the scans and discussed with the patient.   I have reviewed Venita Lick LPN's note and agree with the documentation.  I personally performed a face-to-face visit, made revisions and my assessment and plan is as follows.    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 -Pathology shows 4.2 cm tumor, margins negative, 17 mm depth of invasion, moderately to poorly differentiated, positive neural invasion, 2/17 lymph nodes positive, ECE positive, pT4a, PN 3B. - Will obtain P 16 staining results from [facility]. - Because of adverse features including ECE, positive perineural invasion, T4 primary and N3 disease, she was recommended systemic therapy with RT as a category 1 recommendation. - IMRT 60 Gy in 30 fractions started on 06/27/2019. -She received first cycle of cisplatin weekly on 06/27/2019.  She denied any GI side effects other than constipation.  -She reportedly lost appetite last Thursday.  She is drinking Ensure 2 cans/day. -She will proceed with her cycle 2 today.  We have reviewed her labs.  She was encouraged to drink lots of fluids.  2.  Hypertension: -She is taking Norvasc 5 mg daily.  HCTZ 25 mg is on hold.  3.  Nutrition: -Her appetite decreased last week.  She is drinking 2 ensures per day.  She lost 6 pounds from last week.  We will keep a close eye on her.  Total time spent is 25 minutes with more than 50% of the time spent face-to-face discussing treatment plan, counseling and coordination of care.    Orders placed this encounter:  No orders of the defined types were placed in this encounter.     Derek Jack, MD [place] [phone]

## 2019-07-05 ENCOUNTER — Other Ambulatory Visit: Payer: Self-pay

## 2019-07-05 ENCOUNTER — Ambulatory Visit
Admission: RE | Admit: 2019-07-05 | Discharge: 2019-07-05 | Disposition: A | Discharge status: Home / Self Care | Payer: BC Managed Care – PPO | Source: Ambulatory Visit | Attending: Radiation Oncology | Admitting: Radiation Oncology

## 2019-07-05 ENCOUNTER — Inpatient Hospital Stay (HOSPITAL_COMMUNITY): Payer: BC Managed Care – PPO

## 2019-07-05 VITALS — BP 127/75 | HR 79 | Temp 97.9°F | Resp 16

## 2019-07-05 DIAGNOSIS — C023 Malignant neoplasm of anterior two-thirds of tongue, part unspecified: Secondary | ICD-10-CM | POA: Diagnosis not present

## 2019-07-05 DIAGNOSIS — C021 Malignant neoplasm of border of tongue: Secondary | ICD-10-CM

## 2019-07-05 MED ORDER — SODIUM CHLORIDE 0.9% FLUSH
10.0000 mL | Freq: Once | INTRAVENOUS | Status: AC | PRN
  Administered 2019-07-05: 10 mL

## 2019-07-05 MED ORDER — SODIUM CHLORIDE 0.9 % IV SOLN
Freq: Once | INTRAVENOUS | Status: AC
  Administered 2019-07-05: 12:00:00 via INTRAVENOUS
  Filled 2019-07-05: qty 10

## 2019-07-05 MED ORDER — HEPARIN SOD (PORK) LOCK FLUSH 100 UNIT/ML IV SOLN
500.0000 [IU] | Freq: Once | INTRAVENOUS | Status: AC | PRN
  Administered 2019-07-05: 500 [IU]

## 2019-07-05 NOTE — Patient Instructions (Signed)
[facility] Discharge Instructions for Patients Receiving Chemotherapy  Today you received the following chemotherapy agents   To help prevent nausea and vomiting after your treatment, we encourage you to take your nausea medication   If you develop nausea and vomiting that is not controlled by your nausea medication, call the clinic.   BELOW ARE SYMPTOMS THAT SHOULD BE REPORTED IMMEDIATELY:  *FEVER GREATER THAN 100.5 F  *CHILLS WITH OR WITHOUT FEVER  NAUSEA AND VOMITING THAT IS NOT CONTROLLED WITH YOUR NAUSEA MEDICATION  *UNUSUAL SHORTNESS OF BREATH  *UNUSUAL BRUISING OR BLEEDING  TENDERNESS IN MOUTH AND THROAT WITH OR WITHOUT PRESENCE OF ULCERS  *URINARY PROBLEMS  *BOWEL PROBLEMS  UNUSUAL RASH Items with * indicate a potential emergency and should be followed up as soon as possible.  Feel free to call the clinic should you have any questions or concerns. The clinic phone number is [phone].  Please show the CHEMO ALERT CARD at check-in to the Emergency Department and triage nurse.   

## 2019-07-06 ENCOUNTER — Other Ambulatory Visit: Payer: Self-pay

## 2019-07-06 ENCOUNTER — Ambulatory Visit (HOSPITAL_COMMUNITY): Payer: BC Managed Care – PPO

## 2019-07-06 ENCOUNTER — Ambulatory Visit
Admission: RE | Admit: 2019-07-06 | Discharge: 2019-07-06 | Disposition: A | Discharge status: Home / Self Care | Payer: BC Managed Care – PPO | Source: Ambulatory Visit | Attending: Radiation Oncology | Admitting: Radiation Oncology

## 2019-07-06 DIAGNOSIS — C023 Malignant neoplasm of anterior two-thirds of tongue, part unspecified: Secondary | ICD-10-CM | POA: Diagnosis not present

## 2019-07-07 ENCOUNTER — Other Ambulatory Visit: Payer: Self-pay

## 2019-07-07 ENCOUNTER — Ambulatory Visit
Admission: RE | Admit: 2019-07-07 | Discharge: 2019-07-07 | Disposition: A | Discharge status: Home / Self Care | Payer: BC Managed Care – PPO | Source: Ambulatory Visit | Attending: Radiation Oncology | Admitting: Radiation Oncology

## 2019-07-07 DIAGNOSIS — Z510 Encounter for antineoplastic radiation therapy: Secondary | ICD-10-CM | POA: Insufficient documentation

## 2019-07-07 DIAGNOSIS — C023 Malignant neoplasm of anterior two-thirds of tongue, part unspecified: Secondary | ICD-10-CM | POA: Diagnosis present

## 2019-07-08 ENCOUNTER — Ambulatory Visit (HOSPITAL_COMMUNITY): Payer: BC Managed Care – PPO

## 2019-07-08 ENCOUNTER — Other Ambulatory Visit: Payer: Self-pay

## 2019-07-08 ENCOUNTER — Ambulatory Visit
Admission: RE | Admit: 2019-07-08 | Discharge: 2019-07-08 | Disposition: A | Discharge status: Home / Self Care | Payer: BC Managed Care – PPO | Source: Ambulatory Visit | Attending: Radiation Oncology | Admitting: Radiation Oncology

## 2019-07-08 DIAGNOSIS — C023 Malignant neoplasm of anterior two-thirds of tongue, part unspecified: Secondary | ICD-10-CM | POA: Diagnosis not present

## 2019-07-08 NOTE — Progress Notes (Signed)
Nutrition  Called patient for nutrition follow-up.  No answer.  Left message with callback number.   Skyler Carel B. Zenia Resides, [place], [place] Registered Dietitian [phone] (pager)

## 2019-07-11 ENCOUNTER — Inpatient Hospital Stay (HOSPITAL_COMMUNITY): Payer: BC Managed Care – PPO | Attending: Hematology

## 2019-07-11 ENCOUNTER — Encounter (HOSPITAL_COMMUNITY): Payer: Self-pay | Admitting: Hematology

## 2019-07-11 ENCOUNTER — Inpatient Hospital Stay (HOSPITAL_BASED_OUTPATIENT_CLINIC_OR_DEPARTMENT_OTHER): Payer: BC Managed Care – PPO | Admitting: Hematology

## 2019-07-11 ENCOUNTER — Other Ambulatory Visit: Payer: Self-pay

## 2019-07-11 ENCOUNTER — Other Ambulatory Visit: Payer: Self-pay | Admitting: Radiation Oncology

## 2019-07-11 ENCOUNTER — Ambulatory Visit
Admission: RE | Admit: 2019-07-11 | Discharge: 2019-07-11 | Disposition: A | Discharge status: Home / Self Care | Payer: BC Managed Care – PPO | Source: Ambulatory Visit | Attending: Radiation Oncology | Admitting: Radiation Oncology

## 2019-07-11 VITALS — BP 133/70 | HR 69 | Temp 97.9°F | Resp 18 | Wt 177.6 lb

## 2019-07-11 VITALS — BP 123/71 | HR 58 | Temp 97.6°F | Resp 18

## 2019-07-11 DIAGNOSIS — C021 Malignant neoplasm of border of tongue: Secondary | ICD-10-CM

## 2019-07-11 DIAGNOSIS — K5900 Constipation, unspecified: Secondary | ICD-10-CM | POA: Insufficient documentation

## 2019-07-11 DIAGNOSIS — C023 Malignant neoplasm of anterior two-thirds of tongue, part unspecified: Secondary | ICD-10-CM | POA: Diagnosis not present

## 2019-07-11 DIAGNOSIS — Z23 Encounter for immunization: Secondary | ICD-10-CM | POA: Diagnosis not present

## 2019-07-11 DIAGNOSIS — Z79899 Other long term (current) drug therapy: Secondary | ICD-10-CM | POA: Insufficient documentation

## 2019-07-11 DIAGNOSIS — Z0000 Encounter for general adult medical examination without abnormal findings: Secondary | ICD-10-CM | POA: Diagnosis not present

## 2019-07-11 DIAGNOSIS — Z95828 Presence of other vascular implants and grafts: Secondary | ICD-10-CM

## 2019-07-11 DIAGNOSIS — I10 Essential (primary) hypertension: Secondary | ICD-10-CM | POA: Insufficient documentation

## 2019-07-11 DIAGNOSIS — R630 Anorexia: Secondary | ICD-10-CM | POA: Insufficient documentation

## 2019-07-11 DIAGNOSIS — Z791 Long term (current) use of non-steroidal anti-inflammatories (NSAID): Secondary | ICD-10-CM | POA: Diagnosis not present

## 2019-07-11 DIAGNOSIS — Z5111 Encounter for antineoplastic chemotherapy: Secondary | ICD-10-CM | POA: Diagnosis present

## 2019-07-11 LAB — COMPREHENSIVE METABOLIC PANEL
ALT: 38 U/L (ref 0–44)
AST: 23 U/L (ref 15–41)
Albumin: 3.9 g/dL (ref 3.5–5.0)
Alkaline Phosphatase: 57 U/L (ref 38–126)
Anion gap: 10 (ref 5–15)
BUN: 14 mg/dL (ref 8–23)
CO2: 23 mmol/L (ref 22–32)
Calcium: 9.1 mg/dL (ref 8.9–10.3)
Chloride: 100 mmol/L (ref 98–111)
Creatinine, Ser: 0.82 mg/dL (ref 0.44–1.00)
GFR calc Af Amer: >60 mL/min (ref >60)
GFR calc non Af Amer: >60 mL/min (ref >60)
Glucose, Bld: 82 mg/dL (ref 70–99)
Potassium: 4 mmol/L (ref 3.5–5.1)
Sodium: 133 mmol/L — ABNORMAL LOW (ref 135–145)
Total Bilirubin: 0.4 mg/dL (ref 0.3–1.2)
Total Protein: 7.2 g/dL (ref 6.5–8.1)

## 2019-07-11 LAB — CBC WITH DIFFERENTIAL/PLATELET
Abs Immature Granulocytes: 0 10*3/uL (ref 0.00–0.07)
Basophils Absolute: 0 10*3/uL (ref 0.0–0.1)
Basophils Relative: 1 %
Eosinophils Absolute: 0.1 10*3/uL (ref 0.0–0.5)
Eosinophils Relative: 2 %
HCT: 40 % (ref 36.0–46.0)
Hemoglobin: 13.1 g/dL (ref 12.0–15.0)
Immature Granulocytes: 0 %
Lymphocytes Relative: 14 %
Lymphs Abs: 0.5 10*3/uL — ABNORMAL LOW (ref 0.7–4.0)
MCH: 28.8 pg (ref 26.0–34.0)
MCHC: 32.8 g/dL (ref 30.0–36.0)
MCV: 87.9 fL (ref 80.0–100.0)
Monocytes Absolute: 0.4 10*3/uL (ref 0.1–1.0)
Monocytes Relative: 12 %
Neutro Abs: 2.4 10*3/uL (ref 1.7–7.7)
Neutrophils Relative %: 71 %
Platelets: 202 10*3/uL (ref 150–400)
RBC: 4.55 MIL/uL (ref 3.87–5.11)
RDW: 13.2 % (ref 11.5–15.5)
WBC: 3.4 10*3/uL — ABNORMAL LOW (ref 4.0–10.5)
nRBC: 0 % (ref 0.0–0.2)

## 2019-07-11 LAB — MAGNESIUM: Magnesium: 1.9 mg/dL (ref 1.7–2.4)

## 2019-07-11 LAB — URIC ACID: Uric Acid, Serum: 7 mg/dL (ref 2.5–7.1)

## 2019-07-11 LAB — PHOSPHORUS: Phosphorus: 3 mg/dL (ref 2.5–4.6)

## 2019-07-11 MED ORDER — SODIUM CHLORIDE 0.9 % IV SOLN
40.0000 mg/m2 | Freq: Once | INTRAVENOUS | Status: AC
  Administered 2019-07-11: 79 mg via INTRAVENOUS
  Filled 2019-07-11: qty 79

## 2019-07-11 MED ORDER — DRONABINOL 5 MG PO CAPS: 5.0000 mg | ORAL_CAPSULE | Freq: Two times a day (BID) | ORAL | 0 refills | Status: DC

## 2019-07-11 MED ORDER — INFLUENZA VAC A&B SA ADJ QUAD 0.5 ML IM PRSY
0.5000 mL | PREFILLED_SYRINGE | Freq: Once | INTRAMUSCULAR | Status: AC
  Administered 2019-07-11: 0.5 mL via INTRAMUSCULAR
  Filled 2019-07-11: qty 0.5

## 2019-07-11 MED ORDER — SODIUM CHLORIDE 0.9 % IV SOLN
Freq: Once | INTRAVENOUS | Status: AC
  Administered 2019-07-11: 13:00:00 via INTRAVENOUS
  Filled 2019-07-11: qty 5

## 2019-07-11 MED ORDER — LIDOCAINE VISCOUS HCL 2 % MT SOLN: OROMUCOSAL | 5 refills | Status: DC

## 2019-07-11 MED ORDER — INFLUENZA VAC A&B SA ADJ QUAD 0.5 ML IM PRSY: 0.5000 mL | PREFILLED_SYRINGE | INTRAMUSCULAR | Status: DC

## 2019-07-11 MED ORDER — HEPARIN SOD (PORK) LOCK FLUSH 100 UNIT/ML IV SOLN
500.0000 [IU] | Freq: Once | INTRAVENOUS | Status: AC | PRN
  Administered 2019-07-11: 500 [IU]

## 2019-07-11 MED ORDER — SODIUM CHLORIDE 0.9% FLUSH
10.0000 mL | INTRAVENOUS | Status: DC | PRN
  Administered 2019-07-11: 10 mL
  Filled 2019-07-11: qty 10

## 2019-07-11 MED ORDER — POTASSIUM CHLORIDE 2 MEQ/ML IV SOLN
Freq: Once | INTRAVENOUS | Status: AC
  Administered 2019-07-11: 10:00:00 via INTRAVENOUS
  Filled 2019-07-11: qty 10

## 2019-07-11 MED ORDER — SODIUM CHLORIDE 0.9 % IV SOLN
Freq: Once | INTRAVENOUS | Status: AC
  Administered 2019-07-11: 13:00:00 via INTRAVENOUS

## 2019-07-11 MED ORDER — PALONOSETRON HCL INJECTION 0.25 MG/5ML
0.2500 mg | Freq: Once | INTRAVENOUS | Status: AC
  Administered 2019-07-11: 0.25 mg via INTRAVENOUS
  Filled 2019-07-11: qty 5

## 2019-07-11 NOTE — Progress Notes (Signed)
[place], [place] [address]   CLINIC:  Medical Oncology/Hematology  PCP:  [name], DO [address] [name] [address] [phone]   REASON FOR VISIT:  Follow-up for right tongue cancer.     INTERVAL HISTORY:  Terri [name] 68 y.o. female seen for follow-up of tongue cancer.  Last weekly dose of cisplatin was on 07/04/2019.  She had lost 4 pounds since last visit.  She is drinking 2 cans of Ensure per day.  She reported mild soreness in the right jaw region.  Energy levels are 50%.  Denies any nausea, vomiting, diarrhea or constipation.  Denies any fevers or chills.    REVIEW OF SYSTEMS:  Review of Systems  Constitutional: Positive for unexpected weight change.  All other systems reviewed and are negative.    PAST MEDICAL/SURGICAL HISTORY:  Past Medical History:  Diagnosis Date  . Hypertension   . Port-A-Cath in place 06/14/2019   Past Surgical History:  Procedure Laterality Date  . 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 Dr. Nicolette Bang at [facility]  . PORTACATH PLACEMENT Left 06/10/2019   Procedure: INSERTION PORT-A-CATH;  Surgeon: Aviva Signs, MD;  Location: [facility];  Service: General;  Laterality: Left;  pt knows to arrive at 6:15  . TUBAL LIGATION       SOCIAL HISTORY:  Social History   Socioeconomic History  . Marital status: Widowed    Spouse name: Not on file  . Number of children: 2  . Years of education: Not on file  . Highest education level: Not on file  Occupational History  . Not on file  Social Needs  . Financial resource strain: Not hard at all  . Food insecurity    Worry: Never true    Inability: Never true  . Transportation needs    Medical: No    Non-medical: No  Tobacco Use  . Smoking status: Never Smoker  . Smokeless tobacco: Never Used  Substance and Sexual Activity  . Alcohol use: Never   Frequency: Never  . Drug use: Never  . Sexual activity: Not on file  Lifestyle  . Physical activity    Days per week: 0 days    Minutes per session: 0 min  . Stress: Only a little  Relationships  . Social connections    Talks on phone: More than three times a week    Gets together: More than three times a week    Attends religious service: More than 4 times per year    Active member of club or organization: Yes    Attends meetings of clubs or organizations: More than 4 times per year    Relationship status: Widowed  . Intimate partner violence    Fear of current or ex partner: No    Emotionally abused: No    Physically abused: No    Forced sexual activity: No  Other Topics Concern  . Not on file  Social History Narrative      Patient is a widow.   Patient lives with her 2 sons in [place], [place].   Patient is a non-smoker, nondrinker.  Patient has never used chew.  Patient does not use illicit drugs.    FAMILY HISTORY:  Family History  Problem Relation Age of Onset  . Heart failure Mother   . Heart failure Father     CURRENT MEDICATIONS:  Outpatient Encounter Medications as of 07/11/2019  Medication Sig Note  . amLODipine (NORVASC) 5 MG tablet Take 5 mg by mouth daily.   . chlorhexidine (PERIDEX) 0.12 % solution Use as directed 15 mLs in the mouth or throat 2 (two) times daily.   [name] CISPLATIN IV Inject into the vein once a week.   . Multiple Vitamins-Minerals (MULTIVITAMIN WITH MINERALS) tablet Take 1 tablet by mouth daily.   [name] omeprazole (PRILOSEC) 40 MG capsule Take 40 mg by mouth daily.    [name] acetaminophen (TYLENOL) 500 MG tablet Take 500 mg by mouth every 8 (eight) hours as needed (pain).    [name] diclofenac (VOLTAREN) 75 MG EC tablet Take 75 mg by mouth 2 (two) times daily as needed (pain).    [name] dronabinol (MARINOL) 5 MG capsule Take 1 capsule (5 mg total) by mouth 2 (two) times daily before a meal.   . HYDROcodone-acetaminophen (NORCO) 5-325 MG tablet Take 1 tablet  by mouth every 4 (four) hours as needed for moderate pain.   [name] lidocaine (XYLOCAINE) 2 % solution Patient: Mix 1part 2% viscous lidocaine, 1part H20. Swish & swallow 52mL of diluted mixture, 11min before meals and at bedtime, up to QID. May otherwise swish and spit up to 8 times daily.   [name] lidocaine-prilocaine (EMLA) cream Apply small amount to port a cath site and cover with plastic wrap one hour prior to chemotherapy appointments.   . prochlorperazine (COMPAZINE) 10 MG tablet Take 1 tablet (10 mg total) by mouth every 6 (six) hours as needed (Nausea or vomiting).   . [EXPIRED] dextrose 5 % and 0.45% NaCl 1,000 mL with potassium chloride 20 mEq, magnesium sulfate 12 mEq infusion    . [DISCONTINUED] influenza vaccine adjuvanted (FLUAD) injection 0.5 mL  07/11/2019: on different encounter   No facility-administered encounter medications on file as of 07/11/2019.     ALLERGIES:  No Known Allergies   PHYSICAL EXAM:  ECOG Performance status: 0  Vitals:   07/11/19 1006  BP: 133/70  Pulse: 69  Resp: 18  Temp: 97.9 F (36.6 C)  SpO2: 100%   Filed Weights   07/11/19 1006  Weight: 177 lb 9.6 oz (80.6 kg)    Physical Exam Vitals signs reviewed.  Constitutional:      Appearance: Normal appearance.  HENT:     Mouth/Throat:     Comments: Right lateral tongue is well-healed.   Cardiovascular:     Rate and Rhythm: Normal rate and regular rhythm.     Heart sounds: Normal heart sounds.  Pulmonary:     Effort: Pulmonary effort is normal.     Breath sounds: Normal breath sounds.  Abdominal:     General: There is no distension.     Palpations: Abdomen is soft. There is no mass.  Musculoskeletal:        General: No swelling.  Lymphadenopathy:     Cervical: No cervical adenopathy.  Skin:    General: Skin is warm.  Neurological:     General: No focal deficit present.     Mental Status: She is alert and oriented to person, place, and time.  Psychiatric:        Mood and Affect: Mood  normal.        Behavior: Behavior normal.   Neck incision site is also well-healed.   LABORATORY DATA:  I have reviewed the labs as listed.  CBC    Component Value Date/Time   WBC 3.4 (L) 07/11/2019 1016   RBC 4.55  07/11/2019 1016   HGB 13.1 07/11/2019 1016   HCT 40.0 07/11/2019 1016   PLT 202 07/11/2019 1016   MCV 87.9 07/11/2019 1016   MCH 28.8 07/11/2019 1016   MCHC 32.8 07/11/2019 1016   RDW 13.2 07/11/2019 1016   LYMPHSABS 0.5 (L) 07/11/2019 1016   MONOABS 0.4 07/11/2019 1016   EOSABS 0.1 07/11/2019 1016   BASOSABS 0.0 07/11/2019 1016   CMP Latest Ref Rng & Units 07/11/2019 07/04/2019 06/23/2019  Glucose 70 - 99 mg/dL 82 90 92  BUN 8 - 23 mg/dL 14 13 6(L)  Creatinine 0.44 - 1.00 mg/dL 0.82 0.97 1.06(H)  Sodium 135 - 145 mmol/L 133(L) 132(L) 139  Potassium 3.5 - 5.1 mmol/L 4.0 3.9 3.7  Chloride 98 - 111 mmol/L 100 97(L) 105  CO2 22 - 32 mmol/L 23 25 25   Calcium 8.9 - 10.3 mg/dL 9.1 9.2 9.3  Total Protein 6.5 - 8.1 g/dL 7.2 7.3 7.4  Total Bilirubin 0.3 - 1.2 mg/dL 0.4 0.9 0.6  Alkaline Phos 38 - 126 U/L 57 61 55  AST 15 - 41 U/L 23 24 24   ALT 0 - 44 U/L 38 39 22       DIAGNOSTIC IMAGING:  I have independently reviewed the scans and discussed with the patient.   I have reviewed Venita Lick LPN's note and agree with the documentation.  I personally performed a face-to-face visit, made revisions and my assessment and plan is as follows.    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 -Pathology shows 4.2 cm tumor, margins negative, 17 mm depth of invasion, moderately to poorly differentiated, positive neural invasion, 2/17 lymph nodes positive, ECE positive, pT4a, PN 3B. - Will obtain P 16 staining results from  [facility]. - Because of adverse features including ECE, positive perineural invasion, T4 primary and N3 disease, she was recommended systemic therapy with RT as a category 1 recommendation. - IMRT 60 Gy in 30 fractions started on 06/27/2019. - Cisplatin weekly started on 06/27/2019.  Week 2 was on 07/04/2019. -She lost about 4 pounds since last week.  She is drinking Ensure 2 cans/day.  She reports severe loss of appetite. -We will start her on Marinol 5 mg twice daily for appetite.  She does report some soreness in the right jaw region. - I have reviewed her labs.  She will proceed with her week 3 of treatment today without any dose modifications.  2.  Hypertension: -She will continue Norvasc 5 mg daily.  HCTZ is on hold.  3.  Nutrition: -She reports loss of appetite.  She lost 4 pounds since last week.  She is drinking Ensure 2 cans/day. -We will start her on Marinol 5 mg twice daily.  Total time spent is 25 minutes with more than 50% of the time spent face-to-face discussing treatment plan, counseling and coordination of care.    Orders placed this encounter:  No orders of the defined types were placed in this encounter.     Derek Jack, MD [place] [phone]

## 2019-07-11 NOTE — Patient Instructions (Signed)
[facility] Discharge Instructions for Patients Receiving Chemotherapy  Today you received the following chemotherapy agents   To help prevent nausea and vomiting after your treatment, we encourage you to take your nausea medication   If you develop nausea and vomiting that is not controlled by your nausea medication, call the clinic.   BELOW ARE SYMPTOMS THAT SHOULD BE REPORTED IMMEDIATELY:  *FEVER GREATER THAN 100.5 F  *CHILLS WITH OR WITHOUT FEVER  NAUSEA AND VOMITING THAT IS NOT CONTROLLED WITH YOUR NAUSEA MEDICATION  *UNUSUAL SHORTNESS OF BREATH  *UNUSUAL BRUISING OR BLEEDING  TENDERNESS IN MOUTH AND THROAT WITH OR WITHOUT PRESENCE OF ULCERS  *URINARY PROBLEMS  *BOWEL PROBLEMS  UNUSUAL RASH Items with * indicate a potential emergency and should be followed up as soon as possible.  Feel free to call the clinic should you have any questions or concerns. The clinic phone number is [phone].  Please show the CHEMO ALERT CARD at check-in to the Emergency Department and triage nurse.   

## 2019-07-11 NOTE — Patient Instructions (Addendum)
[facility] at [facility] Discharge Instructions  You were seen today by Dr. Katragadda. He went over your recent lab results. He will see you back in 1 week for labs and follow up.   Thank you for choosing [facility] at [facility] to provide your oncology and hematology care.  To afford each patient quality time with our provider, please arrive at least 15 minutes before your scheduled appointment time.   If you have a lab appointment with the Cancer Center please come in thru the  Main Entrance and check in at the main information desk  You need to re-schedule your appointment should you arrive 10 or more minutes late.  We strive to give you quality time with our providers, and arriving late affects you and other patients whose appointments are after yours.  Also, if you no show three or more times for appointments you may be dismissed from the clinic at the providers discretion.     Again, thank you for choosing  Cancer Center.  Our hope is that these requests will decrease the amount of time that you wait before being seen by our physicians.       _____________________________________________________________  Should you have questions after your visit to  Cancer Center, please contact our office at [phone] between the hours of 8:00 a.m. and 4:30 p.m.  Voicemails left after 4:00 p.m. will not be returned until the following business day.  For prescription refill requests, have your pharmacy contact our office and allow 72 hours.    Cancer Center Support Programs:   > Cancer Support Group  2nd Tuesday of the month 1pm-2pm, Journey Room    

## 2019-07-11 NOTE — Progress Notes (Signed)
Labs reviewed with MD today, proceed with treatment as planned.  Treatment given per orders. Patient tolerated it well without problems. Vitals stable and discharged home from clinic ambulatory. Follow up as scheduled.

## 2019-07-12 ENCOUNTER — Ambulatory Visit
Admission: RE | Admit: 2019-07-12 | Discharge: 2019-07-12 | Disposition: A | Discharge status: Home / Self Care | Payer: BC Managed Care – PPO | Source: Ambulatory Visit | Attending: Radiation Oncology | Admitting: Radiation Oncology

## 2019-07-12 ENCOUNTER — Inpatient Hospital Stay (HOSPITAL_COMMUNITY): Payer: BC Managed Care – PPO

## 2019-07-12 ENCOUNTER — Other Ambulatory Visit: Payer: Self-pay

## 2019-07-12 VITALS — BP 135/66 | HR 52 | Temp 97.8°F | Resp 18

## 2019-07-12 DIAGNOSIS — C021 Malignant neoplasm of border of tongue: Secondary | ICD-10-CM

## 2019-07-12 DIAGNOSIS — C023 Malignant neoplasm of anterior two-thirds of tongue, part unspecified: Secondary | ICD-10-CM | POA: Diagnosis not present

## 2019-07-12 MED ORDER — HEPARIN SOD (PORK) LOCK FLUSH 100 UNIT/ML IV SOLN
500.0000 [IU] | Freq: Once | INTRAVENOUS | Status: AC | PRN
  Administered 2019-07-12: 500 [IU]

## 2019-07-12 MED ORDER — SODIUM CHLORIDE 0.9 % IV SOLN
Freq: Once | INTRAVENOUS | Status: AC
  Administered 2019-07-12: 12:00:00 via INTRAVENOUS
  Filled 2019-07-12: qty 10

## 2019-07-12 MED ORDER — SODIUM CHLORIDE 0.9% FLUSH
10.0000 mL | Freq: Once | INTRAVENOUS | Status: AC | PRN
  Administered 2019-07-12: 10 mL

## 2019-07-12 NOTE — Progress Notes (Signed)
Pt presents today for Hydration only. VSS. Pt has no complaints of any pain today. Pt denies any changes from yesterday's visit.

## 2019-07-12 NOTE — Patient Instructions (Signed)
[facility] at [facility]  Discharge Instructions:   _______________________________________________________________  Thank you for choosing [facility] at [facility] to provide your oncology and hematology care.  To afford each patient quality time with our providers, please arrive at least 15 minutes before your scheduled appointment.  You need to re-schedule your appointment if you arrive 10 or more minutes late.  We strive to give you quality time with our providers, and arriving late affects you and other patients whose appointments are after yours.  Also, if you no show three or more times for appointments you may be dismissed from the clinic.  Again, thank you for choosing [facility] at [facility]. Our hope is that these requests will allow you access to exceptional care and in a timely manner. _______________________________________________________________  If you have questions after your visit, please contact our office at [phone] between the hours of 8:30 a.m. and 5:00 p.m. Voicemails left after 4:30 p.m. will not be returned until the following business day. _______________________________________________________________  For prescription refill requests, have your pharmacy contact our office. _______________________________________________________________  Recommendations made by the consultant and any test results will be sent to your referring physician. _______________________________________________________________ 

## 2019-07-13 ENCOUNTER — Ambulatory Visit
Admission: RE | Admit: 2019-07-13 | Discharge: 2019-07-13 | Disposition: A | Discharge status: Home / Self Care | Payer: BC Managed Care – PPO | Source: Ambulatory Visit | Attending: Radiation Oncology | Admitting: Radiation Oncology

## 2019-07-13 ENCOUNTER — Other Ambulatory Visit: Payer: Self-pay

## 2019-07-13 ENCOUNTER — Ambulatory Visit (HOSPITAL_COMMUNITY): Payer: BC Managed Care – PPO

## 2019-07-13 DIAGNOSIS — C023 Malignant neoplasm of anterior two-thirds of tongue, part unspecified: Secondary | ICD-10-CM | POA: Diagnosis not present

## 2019-07-14 ENCOUNTER — Ambulatory Visit
Admission: RE | Admit: 2019-07-14 | Discharge: 2019-07-14 | Disposition: A | Discharge status: Home / Self Care | Payer: BC Managed Care – PPO | Source: Ambulatory Visit | Attending: Radiation Oncology | Admitting: Radiation Oncology

## 2019-07-14 ENCOUNTER — Other Ambulatory Visit: Payer: Self-pay

## 2019-07-14 DIAGNOSIS — C023 Malignant neoplasm of anterior two-thirds of tongue, part unspecified: Secondary | ICD-10-CM | POA: Diagnosis not present

## 2019-07-15 ENCOUNTER — Other Ambulatory Visit: Payer: Self-pay

## 2019-07-15 ENCOUNTER — Inpatient Hospital Stay (HOSPITAL_COMMUNITY): Payer: BC Managed Care – PPO

## 2019-07-15 ENCOUNTER — Ambulatory Visit
Admission: RE | Admit: 2019-07-15 | Discharge: 2019-07-15 | Disposition: A | Discharge status: Home / Self Care | Payer: BC Managed Care – PPO | Source: Ambulatory Visit | Attending: Radiation Oncology | Admitting: Radiation Oncology

## 2019-07-15 DIAGNOSIS — C023 Malignant neoplasm of anterior two-thirds of tongue, part unspecified: Secondary | ICD-10-CM | POA: Diagnosis not present

## 2019-07-15 NOTE — Progress Notes (Signed)
Nutrition Follow-up:  Patient with SCC of tongue s/p right excision and right neck dissection on 8/7 at [facility].  Patient receiving radiation and chemotherapy.    Spoke with patient via phone for follow-up.  Patient reports that she has a decreased appetite, desire to eat over the last 1-2 weeks.  Reports that she has started marinol.  Reports that she is able to eat mashed potatoes and gravy, broth (no noodles or meat), some yogurt and pudding and drinking 3 ensure per day.      Medications: marinol added on 10/5  Labs: Na 133, phosphorus and Mag WNL  Anthropometrics:   Weight 177 lb 9.6 oz on 10/5.    Stable weight at beginning of treatment 188 9/21.    5% weight loss in the last 3 weeks, signficant   NUTRITION DIAGNOSIS: Inadequate oral intake related to cancer related treatment side effects as evidenced by 5% weight loss in 3 weeks    INTERVENTION:  Concerned about weight loss and poor po intake.  Discussed option of feeding tube placement with patient briefly. Message sent to provider.  Patient prefers to hold off at this time.   Discussed with patient option of drinking 6 ensure plus/enlive daily to better meet nutritional needs.  Will provide case of ensure enlive for patient to pick up on Monday, October 12 at next visit.  Discussed pureeing foods as well for easy of swallowing.  Patient has contact information    MONITORING, EVALUATION, GOAL: oral intake, weight trends   NEXT VISIT: Friday, October 16  Bodie Abernethy B. Zenia Resides, [place], [place] Registered Dietitian [phone] (pager)

## 2019-07-17 NOTE — Assessment & Plan Note (Signed)
1.  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. -Pathology shows 4.2 cm tumor, margins negative, 17 mm depth of invasion, moderately to poorly differentiated, positive neural invasion, 2/17 lymph nodes positive, ECE positive, pT4a, PN 3B. - Will obtain P 16 staining results from [facility]. - Because of adverse features including ECE, positive perineural invasion, T4 primary and N3 disease, she was recommended systemic therapy with RT as a category 1 recommendation. - IMRT 60 Gy in 30 fractions started on 06/27/2019. - Cisplatin weekly started on 06/27/2019.  Week 2 was on 07/04/2019. -She lost about 4 pounds since last week.  She is drinking Ensure 2 cans/day.  She reports severe loss of appetite. -We will start her on Marinol 5 mg twice daily for appetite.  She does report some soreness in the right jaw region. - I have reviewed her labs.  She will proceed with her week 3 of treatment today without any dose modifications.  2.  Hypertension: -She will continue Norvasc 5 mg daily.  HCTZ is on hold.  3.  Nutrition: -She reports loss of appetite.  She lost 4 pounds since last week.  She is drinking Ensure 2 cans/day. -We will start her on Marinol 5 mg twice daily.

## 2019-07-18 ENCOUNTER — Inpatient Hospital Stay (HOSPITAL_COMMUNITY): Payer: BC Managed Care – PPO

## 2019-07-18 ENCOUNTER — Other Ambulatory Visit: Payer: Self-pay

## 2019-07-18 ENCOUNTER — Ambulatory Visit
Admission: RE | Admit: 2019-07-18 | Discharge: 2019-07-18 | Disposition: A | Discharge status: Home / Self Care | Payer: BC Managed Care – PPO | Source: Ambulatory Visit | Attending: Radiation Oncology | Admitting: Radiation Oncology

## 2019-07-18 ENCOUNTER — Encounter (HOSPITAL_COMMUNITY): Payer: Self-pay | Admitting: Hematology

## 2019-07-18 ENCOUNTER — Inpatient Hospital Stay (HOSPITAL_BASED_OUTPATIENT_CLINIC_OR_DEPARTMENT_OTHER): Payer: BC Managed Care – PPO | Admitting: Hematology

## 2019-07-18 VITALS — BP 122/68 | HR 66 | Temp 97.3°F | Resp 18

## 2019-07-18 DIAGNOSIS — C023 Malignant neoplasm of anterior two-thirds of tongue, part unspecified: Secondary | ICD-10-CM

## 2019-07-18 DIAGNOSIS — C021 Malignant neoplasm of border of tongue: Secondary | ICD-10-CM

## 2019-07-18 DIAGNOSIS — Z95828 Presence of other vascular implants and grafts: Secondary | ICD-10-CM

## 2019-07-18 LAB — CBC WITH DIFFERENTIAL/PLATELET
Abs Immature Granulocytes: 0 10*3/uL (ref 0.00–0.07)
Basophils Absolute: 0 10*3/uL (ref 0.0–0.1)
Basophils Relative: 1 %
Eosinophils Absolute: 0 10*3/uL (ref 0.0–0.5)
Eosinophils Relative: 2 %
HCT: 37.2 % (ref 36.0–46.0)
Hemoglobin: 12.1 g/dL (ref 12.0–15.0)
Immature Granulocytes: 0 %
Lymphocytes Relative: 13 %
Lymphs Abs: 0.3 10*3/uL — ABNORMAL LOW (ref 0.7–4.0)
MCH: 28.7 pg (ref 26.0–34.0)
MCHC: 32.5 g/dL (ref 30.0–36.0)
MCV: 88.2 fL (ref 80.0–100.0)
Monocytes Absolute: 0.4 10*3/uL (ref 0.1–1.0)
Monocytes Relative: 16 %
Neutro Abs: 1.5 10*3/uL — ABNORMAL LOW (ref 1.7–7.7)
Neutrophils Relative %: 68 %
Platelets: 157 10*3/uL (ref 150–400)
RBC: 4.22 MIL/uL (ref 3.87–5.11)
RDW: 13.1 % (ref 11.5–15.5)
WBC: 2.2 10*3/uL — ABNORMAL LOW (ref 4.0–10.5)
nRBC: 0 % (ref 0.0–0.2)

## 2019-07-18 LAB — COMPREHENSIVE METABOLIC PANEL
ALT: 32 U/L (ref 0–44)
AST: 23 U/L (ref 15–41)
Albumin: 3.9 g/dL (ref 3.5–5.0)
Alkaline Phosphatase: 55 U/L (ref 38–126)
Anion gap: 10 (ref 5–15)
BUN: 16 mg/dL (ref 8–23)
CO2: 25 mmol/L (ref 22–32)
Calcium: 9 mg/dL (ref 8.9–10.3)
Chloride: 100 mmol/L (ref 98–111)
Creatinine, Ser: 0.89 mg/dL (ref 0.44–1.00)
GFR calc Af Amer: >60 mL/min (ref >60)
GFR calc non Af Amer: >60 mL/min (ref >60)
Glucose, Bld: 82 mg/dL (ref 70–99)
Potassium: 3.9 mmol/L (ref 3.5–5.1)
Sodium: 135 mmol/L (ref 135–145)
Total Bilirubin: 0.6 mg/dL (ref 0.3–1.2)
Total Protein: 7 g/dL (ref 6.5–8.1)

## 2019-07-18 LAB — MAGNESIUM: Magnesium: 1.7 mg/dL (ref 1.7–2.4)

## 2019-07-18 MED ORDER — SODIUM CHLORIDE 0.9 % IV SOLN
Freq: Once | INTRAVENOUS | Status: AC
  Administered 2019-07-18: 13:00:00 via INTRAVENOUS

## 2019-07-18 MED ORDER — SODIUM CHLORIDE 0.9 % IV SOLN
32.0000 mg/m2 | Freq: Once | INTRAVENOUS | Status: AC
  Administered 2019-07-18: 63 mg via INTRAVENOUS
  Filled 2019-07-18: qty 63

## 2019-07-18 MED ORDER — SODIUM CHLORIDE 0.9% FLUSH
10.0000 mL | INTRAVENOUS | Status: DC | PRN
  Administered 2019-07-18: 10:00:00 10 mL
  Filled 2019-07-18: qty 10

## 2019-07-18 MED ORDER — SODIUM CHLORIDE 0.9 % IV SOLN
Freq: Once | INTRAVENOUS | Status: AC
  Administered 2019-07-18: 13:00:00 via INTRAVENOUS
  Filled 2019-07-18: qty 5

## 2019-07-18 MED ORDER — POTASSIUM CHLORIDE 2 MEQ/ML IV SOLN
Freq: Once | INTRAVENOUS | Status: AC
  Administered 2019-07-18: 10:00:00 via INTRAVENOUS
  Filled 2019-07-18: qty 10

## 2019-07-18 MED ORDER — HEPARIN SOD (PORK) LOCK FLUSH 100 UNIT/ML IV SOLN
500.0000 [IU] | Freq: Once | INTRAVENOUS | Status: AC | PRN
  Administered 2019-07-18: 500 [IU]

## 2019-07-18 MED ORDER — PROCHLORPERAZINE MALEATE 10 MG PO TABS: 10.0000 mg | ORAL_TABLET | Freq: Four times a day (QID) | ORAL | 3 refills | Status: DC | PRN

## 2019-07-18 MED ORDER — PALONOSETRON HCL INJECTION 0.25 MG/5ML
0.2500 mg | Freq: Once | INTRAVENOUS | Status: AC
  Administered 2019-07-18: 0.25 mg via INTRAVENOUS
  Filled 2019-07-18: qty 5

## 2019-07-18 NOTE — Progress Notes (Signed)
Terri [name], [name] [address]   CLINIC:  Medical Oncology/Hematology  PCP:  [name], DO [address] [name] [address] [phone]   REASON FOR VISIT:  Follow-up for right tongue cancer.     INTERVAL HISTORY:  Terri [name] 68 y.o. female seen for follow-up of tongue cancer.  Reports no appetite.  Denied any tingling or numbness in extremities.  Had on and off ringing in the ears lasting only few seconds.  Energy levels are 25%.  Had one episode of vomiting last evening and one episode this morning.  However she ran out of Compazine.  Denies any fevers or chills.   REVIEW OF SYSTEMS:  Review of Systems  Gastrointestinal: Positive for nausea and vomiting.  All other systems reviewed and are negative.    PAST MEDICAL/SURGICAL HISTORY:  Past Medical History:  Diagnosis Date  . Hypertension   . Port-A-Cath in place 06/14/2019   Past Surgical History:  Procedure Laterality Date  . 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 Dr. Nicolette Bang at [name]  . PORTACATH PLACEMENT Left 06/10/2019   Procedure: INSERTION PORT-A-CATH;  Surgeon: Aviva Signs, MD;  Location: [facility];  Service: General;  Laterality: Left;  pt knows to arrive at 6:15  . TUBAL LIGATION       SOCIAL HISTORY:  Social History   Socioeconomic History  . Marital status: Widowed    Spouse name: Not on file  . Number of children: 2  . Years of education: Not on file  . Highest education level: Not on file  Occupational History  . Not on file  Social Needs  . Financial resource strain: Not hard at all  . Food insecurity    Worry: Never true    Inability: Never true  . Transportation needs    Medical: No    Non-medical: No  Tobacco Use  . Smoking status: Never Smoker  . Smokeless tobacco: Never Used  Substance and Sexual Activity  . Alcohol use: Never   Frequency: Never  . Drug use: Never  . Sexual activity: Not on file  Lifestyle  . Physical activity    Days per week: 0 days    Minutes per session: 0 min  . Stress: Only a little  Relationships  . Social connections    Talks on phone: More than three times a week    Gets together: More than three times a week    Attends religious service: More than 4 times per year    Active member of club or organization: Yes    Attends meetings of clubs or organizations: More than 4 times per year    Relationship status: Widowed  . Intimate partner violence    Fear of current or ex partner: No    Emotionally abused: No    Physically abused: No    Forced sexual activity: No  Other Topics Concern  . Not on file  Social History Narrative      Patient is a widow.   Patient lives with her 2 sons in [place], [place].   Patient is a non-smoker, nondrinker.  Patient has never used chew.  Patient does not use illicit drugs.    FAMILY HISTORY:  Family History  Problem Relation Age of Onset  . Heart failure Mother   . Heart failure Father     CURRENT MEDICATIONS:  Outpatient  Encounter Medications as of 07/18/2019  Medication Sig  . acetaminophen (TYLENOL) 500 MG tablet Take 500 mg by mouth every 8 (eight) hours as needed (pain).   [name] amLODipine (NORVASC) 5 MG tablet Take 5 mg by mouth daily.  . chlorhexidine (PERIDEX) 0.12 % solution Use as directed 15 mLs in the mouth or throat 2 (two) times daily.  [name] CISPLATIN IV Inject into the vein once a week.  . diclofenac (VOLTAREN) 75 MG EC tablet Take 75 mg by mouth 2 (two) times daily as needed (pain).   [name] dronabinol (MARINOL) 5 MG capsule Take 1 capsule (5 mg total) by mouth 2 (two) times daily before a meal.  . HYDROcodone-acetaminophen (NORCO) 5-325 MG tablet Take 1 tablet by mouth every 4 (four) hours as needed for moderate pain.  [name] lidocaine (XYLOCAINE) 2 % solution Patient: Mix 1part 2% viscous lidocaine, 1part H20. Swish & swallow 77mL of  diluted mixture, 42min before meals and at bedtime, up to QID. May otherwise swish and spit up to 8 times daily.  [name] lidocaine-prilocaine (EMLA) cream Apply small amount to port a cath site and cover with plastic wrap one hour prior to chemotherapy appointments.  . Multiple Vitamins-Minerals (MULTIVITAMIN WITH MINERALS) tablet Take 1 tablet by mouth daily.  [name] omeprazole (PRILOSEC) 40 MG capsule Take 40 mg by mouth daily.   . prochlorperazine (COMPAZINE) 10 MG tablet Take 1 tablet (10 mg total) by mouth every 6 (six) hours as needed (Nausea or vomiting).  . prochlorperazine (COMPAZINE) 10 MG tablet Take 1 tablet (10 mg total) by mouth every 6 (six) hours as needed for nausea or vomiting.   Facility-Administered Encounter Medications as of 07/18/2019  Medication  . [COMPLETED] 0.9 %  sodium chloride infusion  . [COMPLETED] dextrose 5 % and 0.45% NaCl 1,000 mL with potassium chloride 20 mEq, magnesium sulfate 12 mEq infusion    ALLERGIES:  No Known Allergies   PHYSICAL EXAM:  ECOG Performance status: 0  Vitals:   07/18/19 0945  BP: 140/73  Pulse: 72  Resp: 18  Temp: (!) 97.2 F (36.2 C)  SpO2: 100%   Filed Weights   07/18/19 0945  Weight: 174 lb (78.9 kg)    Physical Exam Vitals signs reviewed.  Constitutional:      Appearance: Normal appearance.  HENT:     Mouth/Throat:     Comments: Right lateral tongue is well-healed.   Cardiovascular:     Rate and Rhythm: Normal rate and regular rhythm.     Heart sounds: Normal heart sounds.  Pulmonary:     Effort: Pulmonary effort is normal.     Breath sounds: Normal breath sounds.  Abdominal:     General: There is no distension.     Palpations: Abdomen is soft. There is no mass.  Musculoskeletal:        General: No swelling.  Lymphadenopathy:     Cervical: No cervical adenopathy.  Skin:    General: Skin is warm.  Neurological:     General: No focal deficit present.     Mental Status: She is alert and oriented to person,  place, and time.  Psychiatric:        Mood and Affect: Mood normal.        Behavior: Behavior normal.   Neck incision site is also well-healed.   LABORATORY DATA:  I have reviewed the labs as listed.  CBC    Component Value Date/Time   WBC 2.2 (L) 07/18/2019 0951   RBC 4.22  07/18/2019 0951   HGB 12.1 07/18/2019 0951   HCT 37.2 07/18/2019 0951   PLT 157 07/18/2019 0951   MCV 88.2 07/18/2019 0951   MCH 28.7 07/18/2019 0951   MCHC 32.5 07/18/2019 0951   RDW 13.1 07/18/2019 0951   LYMPHSABS 0.3 (L) 07/18/2019 0951   MONOABS 0.4 07/18/2019 0951   EOSABS 0.0 07/18/2019 0951   BASOSABS 0.0 07/18/2019 0951   CMP Latest Ref Rng & Units 07/18/2019 07/11/2019 07/04/2019  Glucose 70 - 99 mg/dL 82 82 90  BUN 8 - 23 mg/dL 16 14 13   Creatinine 0.44 - 1.00 mg/dL 0.89 0.82 0.97  Sodium 135 - 145 mmol/L 135 133(L) 132(L)  Potassium 3.5 - 5.1 mmol/L 3.9 4.0 3.9  Chloride 98 - 111 mmol/L 100 100 97(L)  CO2 22 - 32 mmol/L 25 23 25   Calcium 8.9 - 10.3 mg/dL 9.0 9.1 9.2  Total Protein 6.5 - 8.1 g/dL 7.0 7.2 7.3  Total Bilirubin 0.3 - 1.2 mg/dL 0.6 0.4 0.9  Alkaline Phos 38 - 126 U/L 55 57 61  AST 15 - 41 U/L 23 23 24   ALT 0 - 44 U/L 32 38 39       DIAGNOSTIC IMAGING:  I have independently reviewed the scans and discussed with the patient.   I have reviewed Venita Lick LPN's note and agree with the documentation.  I personally performed a face-to-face visit, made revisions and my assessment and plan is as follows.    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 -Pathology shows 4.2 cm tumor, margins negative, 17 mm depth of invasion, moderately to poorly differentiated, positive neural invasion, 2/17 lymph nodes positive, ECE  positive, pT4a, PN 3B. - Will obtain P 16 staining results from [facility]. - Because of adverse features including ECE, positive perineural invasion, T4 primary and N3 disease, she was recommended systemic therapy with RT as a category 1 recommendation. - IMRT 60 Gy in 30 fractions started on 06/27/2019. - Cisplatin weekly started on 06/27/2019.  Week 3 was on 07/11/2019. - She lost 3 pounds in the last 1 week.  She lost a total of 7 pounds in the last 2 weeks. - Today we reviewed blood work.  White count is 2.2 with ANC 1500. -We will proceed with her week #4 treatment today.  I will decrease the dose to 32 mg per metered square. -We will reevaluate her next week.  2.  Hypertension: -She will continue Norvasc 5 mg daily.  HCTZ is on hold.  3.  Nutrition: -She reported loss of appetite.  She lost 3 pounds in the last 1 week.  She lost 7 pounds in the last 2 weeks. -She started taking Marinol on 07/13/2019.  She has not noticed any improvement in appetite yet. - She spoke to our dietitian.  She is drinking 3 cans of Ensure per day.  She will try to increase it to 4 cans/day.  Total time spent is 25 minutes with more than 50% of the time spent face-to-face discussing treatment plan, counseling and coordination of care.    Orders placed this encounter:  No orders of the defined types were placed in this encounter.     Derek Jack, MD [place] [phone]

## 2019-07-18 NOTE — Progress Notes (Signed)
Labs reviewed with MD today at office visit. Proceed today with treatment per MD.   Treatment given per orders. Patient tolerated it well without problems. Vitals stable and discharged home from clinic ambulatory. Follow up as scheduled.

## 2019-07-18 NOTE — Patient Instructions (Signed)
[facility] Discharge Instructions for Patients Receiving Chemotherapy  Today you received the following chemotherapy agents   To help prevent nausea and vomiting after your treatment, we encourage you to take your nausea medication   If you develop nausea and vomiting that is not controlled by your nausea medication, call the clinic.   BELOW ARE SYMPTOMS THAT SHOULD BE REPORTED IMMEDIATELY:  *FEVER GREATER THAN 100.5 F  *CHILLS WITH OR WITHOUT FEVER  NAUSEA AND VOMITING THAT IS NOT CONTROLLED WITH YOUR NAUSEA MEDICATION  *UNUSUAL SHORTNESS OF BREATH  *UNUSUAL BRUISING OR BLEEDING  TENDERNESS IN MOUTH AND THROAT WITH OR WITHOUT PRESENCE OF ULCERS  *URINARY PROBLEMS  *BOWEL PROBLEMS  UNUSUAL RASH Items with * indicate a potential emergency and should be followed up as soon as possible.  Feel free to call the clinic should you have any questions or concerns. The clinic phone number is [phone].  Please show the CHEMO ALERT CARD at check-in to the Emergency Department and triage nurse.   

## 2019-07-18 NOTE — Patient Instructions (Signed)
[place] at [facility] Discharge Instructions  You were seen today by Dr. Delton Coombes. He went over your recent labs results. He will see you back in 1 week for labs, treatment and follow up.   Thank you for choosing [place] at [facility] to provide your oncology and hematology care.  To afford each patient quality time with our provider, please arrive at least 15 minutes before your scheduled appointment time.   If you have a lab appointment with the [facility] please come in thru the  Main Entrance and check in at the main information desk  You need to re-schedule your appointment should you arrive 10 or more minutes late.  We strive to give you quality time with our providers, and arriving late affects you and other patients whose appointments are after yours.  Also, if you no show three or more times for appointments you may be dismissed from the clinic at the providers discretion.     Again, thank you for choosing [facility].  Our hope is that these requests will decrease the amount of time that you wait before being seen by our physicians.       _____________________________________________________________  Should you have questions after your visit to [facility], please contact our office at [phone] between the hours of 8:00 a.m. and 4:30 p.m.  Voicemails left after 4:00 p.m. will not be returned until the following business day.  For prescription refill requests, have your pharmacy contact our office and allow 72 hours.    Cancer Center Support Programs:   > Cancer Support Group  2nd Tuesday of the month 1pm-2pm, Journey Room

## 2019-07-18 NOTE — Assessment & Plan Note (Signed)
1.  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 -Pathology shows 4.2 cm tumor, margins negative, 17 mm depth of invasion, moderately to poorly differentiated, positive neural invasion, 2/17 lymph nodes positive, ECE positive, pT4a, PN 3B. - Will obtain P 16 staining results from [facility]. - Because of adverse features including ECE, positive perineural invasion, T4 primary and N3 disease, she was recommended systemic therapy with RT as a category 1 recommendation. - IMRT 60 Gy in 30 fractions started on 06/27/2019. - Cisplatin weekly started on 06/27/2019.  Week 3 was on 07/11/2019. - She lost 3 pounds in the last 1 week.  She lost a total of 7 pounds in the last 2 weeks. - Today we reviewed blood work.  White count is 2.2 with ANC 1500. -We will proceed with her week #4 treatment today.  I will decrease the dose to 32 mg per metered square. -We will reevaluate her next week.  2.  Hypertension: -She will continue Norvasc 5 mg daily.  HCTZ is on hold.  3.  Nutrition: -She reported loss of appetite.  She lost 3 pounds in the last 1 week.  She lost 7 pounds in the last 2 weeks. -She started taking Marinol on 07/13/2019.  She has not noticed any improvement in appetite yet. - She spoke to our dietitian.  She is drinking 3 cans of Ensure per day.  She will try to increase it to 4 cans/day.

## 2019-07-19 ENCOUNTER — Ambulatory Visit
Admission: RE | Admit: 2019-07-19 | Discharge: 2019-07-19 | Disposition: A | Discharge status: Home / Self Care | Payer: BC Managed Care – PPO | Source: Ambulatory Visit | Attending: Radiation Oncology | Admitting: Radiation Oncology

## 2019-07-19 ENCOUNTER — Other Ambulatory Visit: Payer: Self-pay

## 2019-07-19 ENCOUNTER — Inpatient Hospital Stay (HOSPITAL_COMMUNITY): Payer: BC Managed Care – PPO

## 2019-07-19 VITALS — BP 113/85 | HR 61 | Temp 97.9°F | Resp 18

## 2019-07-19 DIAGNOSIS — C021 Malignant neoplasm of border of tongue: Secondary | ICD-10-CM

## 2019-07-19 DIAGNOSIS — C023 Malignant neoplasm of anterior two-thirds of tongue, part unspecified: Secondary | ICD-10-CM | POA: Diagnosis not present

## 2019-07-19 MED ORDER — SODIUM CHLORIDE 0.9% FLUSH
10.0000 mL | Freq: Once | INTRAVENOUS | Status: AC | PRN
  Administered 2019-07-19: 10 mL

## 2019-07-19 MED ORDER — HEPARIN SOD (PORK) LOCK FLUSH 100 UNIT/ML IV SOLN
500.0000 [IU] | Freq: Once | INTRAVENOUS | Status: AC | PRN
  Administered 2019-07-19: 500 [IU]

## 2019-07-19 MED ORDER — SODIUM CHLORIDE 0.9 % IV SOLN
Freq: Once | INTRAVENOUS | Status: AC
  Administered 2019-07-19: 11:00:00 via INTRAVENOUS
  Filled 2019-07-19: qty 10

## 2019-07-19 NOTE — Patient Instructions (Signed)
[facility] at [facility]  Discharge Instructions:   _______________________________________________________________  Thank you for choosing [facility] at [facility] to provide your oncology and hematology care.  To afford each patient quality time with our providers, please arrive at least 15 minutes before your scheduled appointment.  You need to re-schedule your appointment if you arrive 10 or more minutes late.  We strive to give you quality time with our providers, and arriving late affects you and other patients whose appointments are after yours.  Also, if you no show three or more times for appointments you may be dismissed from the clinic.  Again, thank you for choosing [facility] at [facility]. Our hope is that these requests will allow you access to exceptional care and in a timely manner. _______________________________________________________________  If you have questions after your visit, please contact our office at [phone] between the hours of 8:30 a.m. and 5:00 p.m. Voicemails left after 4:30 p.m. will not be returned until the following business day. _______________________________________________________________  For prescription refill requests, have your pharmacy contact our office. _______________________________________________________________  Recommendations made by the consultant and any test results will be sent to your referring physician. _______________________________________________________________ 

## 2019-07-19 NOTE — Progress Notes (Signed)
Hydration fluids today per orders. Vitals stable and discharged home from clinic ambulatory. Follow up as scheduled.

## 2019-07-20 ENCOUNTER — Ambulatory Visit (HOSPITAL_COMMUNITY): Payer: BC Managed Care – PPO

## 2019-07-20 ENCOUNTER — Other Ambulatory Visit: Payer: Self-pay

## 2019-07-20 ENCOUNTER — Ambulatory Visit
Admission: RE | Admit: 2019-07-20 | Discharge: 2019-07-20 | Disposition: A | Discharge status: Home / Self Care | Payer: BC Managed Care – PPO | Source: Ambulatory Visit | Attending: Radiation Oncology | Admitting: Radiation Oncology

## 2019-07-20 DIAGNOSIS — C023 Malignant neoplasm of anterior two-thirds of tongue, part unspecified: Secondary | ICD-10-CM | POA: Diagnosis not present

## 2019-07-20 MED ORDER — HEPARIN SOD (PORK) LOCK FLUSH 100 UNIT/ML IV SOLN: 500.0000 [IU] | Freq: Once | INTRAVENOUS | Status: AC | PRN

## 2019-07-20 MED ORDER — SODIUM CHLORIDE 0.9 % IV SOLN
Freq: Once | INTRAVENOUS | Status: AC
  Filled 2019-07-20: qty 10

## 2019-07-20 MED ORDER — SODIUM CHLORIDE 0.9% FLUSH: 10.0000 mL | Freq: Once | INTRAVENOUS | Status: AC | PRN

## 2019-07-21 ENCOUNTER — Other Ambulatory Visit: Payer: Self-pay

## 2019-07-21 ENCOUNTER — Ambulatory Visit
Admission: RE | Admit: 2019-07-21 | Discharge: 2019-07-21 | Disposition: A | Discharge status: Home / Self Care | Payer: BC Managed Care – PPO | Source: Ambulatory Visit | Attending: Radiation Oncology | Admitting: Radiation Oncology

## 2019-07-21 DIAGNOSIS — C023 Malignant neoplasm of anterior two-thirds of tongue, part unspecified: Secondary | ICD-10-CM | POA: Diagnosis not present

## 2019-07-22 ENCOUNTER — Other Ambulatory Visit: Payer: Self-pay

## 2019-07-22 ENCOUNTER — Ambulatory Visit
Admission: RE | Admit: 2019-07-22 | Discharge: 2019-07-22 | Disposition: A | Discharge status: Home / Self Care | Payer: BC Managed Care – PPO | Source: Ambulatory Visit | Attending: Radiation Oncology | Admitting: Radiation Oncology

## 2019-07-22 ENCOUNTER — Ambulatory Visit (HOSPITAL_COMMUNITY): Payer: BC Managed Care – PPO

## 2019-07-22 DIAGNOSIS — C023 Malignant neoplasm of anterior two-thirds of tongue, part unspecified: Secondary | ICD-10-CM | POA: Diagnosis not present

## 2019-07-22 NOTE — Progress Notes (Signed)
Nutrition Follow-up:  Patient with SCC of tongue s/p right excision and right neck dissection on 8/7 at [facility].  Patient receiving radiation and chemotherapy.    Spoke with patient via phone for follow-up.  Patient reports that she is having issues with nausea and tongue being painful due to radiation.  Reports that she has drank an ensure today and water.  This week has had soups, ice cream made her nauseated.  Reports thick saliva as well.  Reports that she is swishing with baking soda, salt and warm water.  Also swishing with diet gingerale.  Reports that she still is trying to eat as much as she can    Medications: reviewed  Labs: reviewed  Anthropometrics:   Weight 174 lb decreased from 177 lb on 10/5.    Starting weight 188 (9/21)  NUTRITION DIAGNOSIS: Inadequate oral intake continues   INTERVENTION:  Continue to be concerned regarding nutritional intake.  Patient has declined feeding tube.  Encouraged patient to continue to drink ensure enlive, drinking 6 daily would better meet nutritional needs.   Discussed smooth, high calorie, high protein liquid options as well Will leave another case of ensure enlive for patient to pick up on 10/19    MONITORING, EVALUATION, GOAL: oral intake, weight trends   NEXT VISIT: phone f/u October 23  Terri [name] B. Terri [name], [place], Terri [name] Registered Dietitian [phone] (pager)

## 2019-07-25 ENCOUNTER — Inpatient Hospital Stay (HOSPITAL_COMMUNITY): Payer: BC Managed Care – PPO

## 2019-07-25 ENCOUNTER — Encounter (HOSPITAL_COMMUNITY): Payer: Self-pay

## 2019-07-25 ENCOUNTER — Ambulatory Visit
Admission: RE | Admit: 2019-07-25 | Discharge: 2019-07-25 | Disposition: A | Discharge status: Home / Self Care | Payer: BC Managed Care – PPO | Source: Ambulatory Visit | Attending: Radiation Oncology | Admitting: Radiation Oncology

## 2019-07-25 ENCOUNTER — Other Ambulatory Visit: Payer: Self-pay

## 2019-07-25 VITALS — BP 118/66 | HR 69 | Temp 97.8°F | Resp 18 | Wt 172.2 lb

## 2019-07-25 DIAGNOSIS — Z95828 Presence of other vascular implants and grafts: Secondary | ICD-10-CM

## 2019-07-25 DIAGNOSIS — C021 Malignant neoplasm of border of tongue: Secondary | ICD-10-CM

## 2019-07-25 DIAGNOSIS — C023 Malignant neoplasm of anterior two-thirds of tongue, part unspecified: Secondary | ICD-10-CM | POA: Diagnosis not present

## 2019-07-25 LAB — CBC WITH DIFFERENTIAL/PLATELET
Abs Immature Granulocytes: 0.01 10*3/uL (ref 0.00–0.07)
Basophils Absolute: 0 10*3/uL (ref 0.0–0.1)
Basophils Relative: 1 %
Eosinophils Absolute: 0 10*3/uL (ref 0.0–0.5)
Eosinophils Relative: 2 %
HCT: 37.5 % (ref 36.0–46.0)
Hemoglobin: 12.3 g/dL (ref 12.0–15.0)
Immature Granulocytes: 1 %
Lymphocytes Relative: 10 %
Lymphs Abs: 0.2 10*3/uL — ABNORMAL LOW (ref 0.7–4.0)
MCH: 29 pg (ref 26.0–34.0)
MCHC: 32.8 g/dL (ref 30.0–36.0)
MCV: 88.4 fL (ref 80.0–100.0)
Monocytes Absolute: 0.2 10*3/uL (ref 0.1–1.0)
Monocytes Relative: 12 %
Neutro Abs: 1.4 10*3/uL — ABNORMAL LOW (ref 1.7–7.7)
Neutrophils Relative %: 74 %
Platelets: 101 10*3/uL — ABNORMAL LOW (ref 150–400)
RBC: 4.24 MIL/uL (ref 3.87–5.11)
RDW: 13.1 % (ref 11.5–15.5)
WBC: 1.9 10*3/uL — ABNORMAL LOW (ref 4.0–10.5)
nRBC: 0 % (ref 0.0–0.2)

## 2019-07-25 LAB — COMPREHENSIVE METABOLIC PANEL
ALT: 34 U/L (ref 0–44)
AST: 24 U/L (ref 15–41)
Albumin: 3.9 g/dL (ref 3.5–5.0)
Alkaline Phosphatase: 53 U/L (ref 38–126)
Anion gap: 10 (ref 5–15)
BUN: 21 mg/dL (ref 8–23)
CO2: 26 mmol/L (ref 22–32)
Calcium: 9.1 mg/dL (ref 8.9–10.3)
Chloride: 100 mmol/L (ref 98–111)
Creatinine, Ser: 0.85 mg/dL (ref 0.44–1.00)
GFR calc Af Amer: >60 mL/min (ref >60)
GFR calc non Af Amer: >60 mL/min (ref >60)
Glucose, Bld: 90 mg/dL (ref 70–99)
Potassium: 4 mmol/L (ref 3.5–5.1)
Sodium: 136 mmol/L (ref 135–145)
Total Bilirubin: 0.4 mg/dL (ref 0.3–1.2)
Total Protein: 6.9 g/dL (ref 6.5–8.1)

## 2019-07-25 LAB — MAGNESIUM: Magnesium: 1.8 mg/dL (ref 1.7–2.4)

## 2019-07-25 MED ORDER — SODIUM CHLORIDE 0.9 % IV SOLN
Freq: Once | INTRAVENOUS | Status: AC
  Administered 2019-07-25: 12:00:00 via INTRAVENOUS
  Filled 2019-07-25: qty 5

## 2019-07-25 MED ORDER — POTASSIUM CHLORIDE 2 MEQ/ML IV SOLN
Freq: Once | INTRAVENOUS | Status: AC
  Administered 2019-07-25: 10:00:00 via INTRAVENOUS
  Filled 2019-07-25: qty 10

## 2019-07-25 MED ORDER — PALONOSETRON HCL INJECTION 0.25 MG/5ML
0.2500 mg | Freq: Once | INTRAVENOUS | Status: AC
  Administered 2019-07-25: 0.25 mg via INTRAVENOUS
  Filled 2019-07-25: qty 5

## 2019-07-25 MED ORDER — SODIUM CHLORIDE 0.9 % IV SOLN
32.0000 mg/m2 | Freq: Once | INTRAVENOUS | Status: AC
  Administered 2019-07-25: 63 mg via INTRAVENOUS
  Filled 2019-07-25: qty 63

## 2019-07-25 MED ORDER — SODIUM CHLORIDE 0.9 % IV SOLN
Freq: Once | INTRAVENOUS | Status: AC
  Administered 2019-07-25: 12:00:00 via INTRAVENOUS

## 2019-07-25 MED ORDER — HEPARIN SOD (PORK) LOCK FLUSH 100 UNIT/ML IV SOLN
500.0000 [IU] | Freq: Once | INTRAVENOUS | Status: AC | PRN
  Administered 2019-07-25: 500 [IU]

## 2019-07-25 MED ORDER — SODIUM CHLORIDE 0.9% FLUSH
10.0000 mL | INTRAVENOUS | Status: DC | PRN
  Administered 2019-07-25: 10 mL
  Filled 2019-07-25: qty 10

## 2019-07-25 NOTE — Progress Notes (Signed)
07/25/19  Dose reduce Cisplatin 20% for today with ANC 1.4 and ok to treat.  T.O. Dr Sondra Barges, NP/Keiry Kowal Ronnald Ramp, PharmD

## 2019-07-25 NOTE — Progress Notes (Signed)
Labs reviewed with Reynolds Bowl NP. Ok to proceed with treatment, dose reduction noted as well.  Treatment given per orders. Patient tolerated it well without problems. Vitals stable and discharged home from clinic ambulatory. Follow up as scheduled.

## 2019-07-25 NOTE — Patient Instructions (Signed)
[facility] Discharge Instructions for Patients Receiving Chemotherapy  Today you received the following chemotherapy agents   To help prevent nausea and vomiting after your treatment, we encourage you to take your nausea medication   If you develop nausea and vomiting that is not controlled by your nausea medication, call the clinic.   BELOW ARE SYMPTOMS THAT SHOULD BE REPORTED IMMEDIATELY:  *FEVER GREATER THAN 100.5 F  *CHILLS WITH OR WITHOUT FEVER  NAUSEA AND VOMITING THAT IS NOT CONTROLLED WITH YOUR NAUSEA MEDICATION  *UNUSUAL SHORTNESS OF BREATH  *UNUSUAL BRUISING OR BLEEDING  TENDERNESS IN MOUTH AND THROAT WITH OR WITHOUT PRESENCE OF ULCERS  *URINARY PROBLEMS  *BOWEL PROBLEMS  UNUSUAL RASH Items with * indicate a potential emergency and should be followed up as soon as possible.  Feel free to call the clinic should you have any questions or concerns. The clinic phone number is [phone].  Please show the CHEMO ALERT CARD at check-in to the Emergency Department and triage nurse.   

## 2019-07-26 ENCOUNTER — Encounter (HOSPITAL_COMMUNITY): Payer: Self-pay | Admitting: Nurse Practitioner

## 2019-07-26 ENCOUNTER — Other Ambulatory Visit: Payer: Self-pay

## 2019-07-26 ENCOUNTER — Inpatient Hospital Stay (HOSPITAL_BASED_OUTPATIENT_CLINIC_OR_DEPARTMENT_OTHER): Payer: BC Managed Care – PPO | Admitting: Nurse Practitioner

## 2019-07-26 ENCOUNTER — Ambulatory Visit
Admission: RE | Admit: 2019-07-26 | Discharge: 2019-07-26 | Disposition: A | Discharge status: Home / Self Care | Payer: BC Managed Care – PPO | Source: Ambulatory Visit | Attending: Radiation Oncology | Admitting: Radiation Oncology

## 2019-07-26 ENCOUNTER — Inpatient Hospital Stay (HOSPITAL_COMMUNITY): Payer: BC Managed Care – PPO

## 2019-07-26 DIAGNOSIS — C023 Malignant neoplasm of anterior two-thirds of tongue, part unspecified: Secondary | ICD-10-CM

## 2019-07-26 DIAGNOSIS — C021 Malignant neoplasm of border of tongue: Secondary | ICD-10-CM

## 2019-07-26 MED ORDER — SODIUM CHLORIDE 0.9% FLUSH
10.0000 mL | Freq: Once | INTRAVENOUS | Status: AC | PRN
  Administered 2019-07-26: 10 mL

## 2019-07-26 MED ORDER — SODIUM CHLORIDE 0.9 % IV SOLN
Freq: Once | INTRAVENOUS | Status: AC
  Administered 2019-07-26: 12:00:00 via INTRAVENOUS
  Filled 2019-07-26: qty 10

## 2019-07-26 MED ORDER — HEPARIN SOD (PORK) LOCK FLUSH 100 UNIT/ML IV SOLN
500.0000 [IU] | Freq: Once | INTRAVENOUS | Status: AC | PRN
  Administered 2019-07-26: 500 [IU]

## 2019-07-26 NOTE — Assessment & Plan Note (Addendum)
1. 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 Nicolette Bang at [facility]. -Pathology showed 4.2 cm tumor, margins negative, 17 mm depth of invasion, moderately to poorly differentiated, positive neutral invasion, 2/17 lymph nodes positive, ECE positive, pT4a, PN 3B. -Will obtain p16 staining results from [facility]. -Because of adverse features including ECE, positive perineural invasion, T4 primary and N3 disease, she was recommended systemic therapy with RT as a category 1 recommendation. -IMRT 60 Gy in 30 fractions started on 06/27/2019. -Cisplatin weekly started on 06/27/2019. -She has lost 9 pounds the last 3 weeks.  She does not have an appetite.  However she is making herself drink 4 cans of Ensure daily. -She did report a slight ringing in her years yesterday but it has stopped now. -She received her fifth treatment yesterday.  She is here today to receive IV fluids. -We will see her back next week with office visit labs and treatment.  2.  Nutrition: -She reported loss of appetite.  She has lost 9 pounds over the last 3 weeks. -She was prescribed Marinol on 07/13/2019.  She is taking it twice a day.  She reports she has not noticed any improvement in her appetite yet. -She spoke with our dietitian.  She is drinking 4 cans of Ensure per day. -She reports her taste and smell are fine its more not wanting food.

## 2019-07-26 NOTE — Patient Instructions (Signed)
[place] at [facility] Discharge Instructions  Follow up in 1 week with labs and treatment.   Thank you for choosing [place] at [facility] to provide your oncology and hematology care.  To afford each patient quality time with our provider, please arrive at least 15 minutes before your scheduled appointment time.   If you have a lab appointment with the [place] please come in thru the Main Entrance and check in at the main information desk.  You need to re-schedule your appointment should you arrive 10 or more minutes late.  We strive to give you quality time with our providers, and arriving late affects you and other patients whose appointments are after yours.  Also, if you no show three or more times for appointments you may be dismissed from the clinic at the providers discretion.     Again, thank you for choosing [facility].  Our hope is that these requests will decrease the amount of time that you wait before being seen by our physicians.       _____________________________________________________________  Should you have questions after your visit to [facility], please contact our office at [phone] between the hours of 8:00 a.m. and 4:30 p.m.  Voicemails left after 4:00 p.m. will not be returned until the following business day.  For prescription refill requests, have your pharmacy contact our office and allow 72 hours.    Due to Covid, you will need to wear a mask upon entering the hospital. If you do not have a mask, a mask will be given to you at the Main Entrance upon arrival. For doctor visits, patients may have 1 support person with them. For treatment visits, patients can not have anyone with them due to social distancing guidelines and our immunocompromised population.

## 2019-07-26 NOTE — Progress Notes (Signed)
Terri [name], [place] [address]   CLINIC:  Medical Oncology/Hematology  PCP:  [name], DO [address] [name] [address] [phone]   REASON FOR VISIT: Follow-up for cancer of the lateral margin of the anterior two thirds of tongue  CURRENT THERAPY: Weekly cisplatin  BRIEF ONCOLOGIC HISTORY:  Oncology History  Cancer of lateral margin of anterior two-thirds of tongue ([place])  04/11/2019 Initial Diagnosis   Cancer of lateral margin of anterior two-thirds of tongue ([place])   05/30/2019 Cancer Staging   Staging form: Oral Cavity, AJCC 8th Edition - Clinical: Stage IVB (cT4a, cN3b, cM0) - Signed by Derek Jack, MD on 05/30/2019   06/07/2019 Cancer Staging   Staging form: Oral Cavity, AJCC 8th Edition - Pathologic stage from 06/07/2019: Stage IVB (pT4a, pN3b, cM0) - Signed by Eppie Gibson, MD on 06/07/2019   06/27/2019 -  Chemotherapy   The patient had palonosetron (ALOXI) injection 0.25 mg, 0.25 mg, Intravenous,  Once, 5 of 7 cycles Administration: 0.25 mg (06/27/2019), 0.25 mg (07/04/2019), 0.25 mg (07/11/2019), 0.25 mg (07/18/2019), 0.25 mg (07/25/2019) CISplatin (PLATINOL) 79 mg in sodium chloride 0.9 % 250 mL chemo infusion, 40 mg/m2 = 79 mg, Intravenous,  Once, 5 of 7 cycles Dose modification: 32 mg/m2 (80 % of original dose 40 mg/m2, Cycle 4, Reason: Other (see comments), Comment: weight loss, low wbc), 32 mg/m2 (original dose 40 mg/m2, Cycle 5, Reason: Treatment Parameters Not Met, Comment: ANC 1.4) Administration: 79 mg (06/27/2019), 79 mg (07/04/2019), 79 mg (07/11/2019), 63 mg (07/18/2019), 63 mg (07/25/2019) fosaprepitant (EMEND) 150 mg, dexamethasone (DECADRON) 12 mg in sodium chloride 0.9 % 145 mL IVPB, , Intravenous,  Once, 5 of 7 cycles Administration:  (06/27/2019),  (07/04/2019),  (07/11/2019),  (07/18/2019),  (07/25/2019)  for chemotherapy treatment.       CANCER STAGING: Cancer Staging Cancer of lateral  margin of anterior two-thirds of tongue ([place]) Staging form: Oral Cavity, AJCC 8th Edition - Clinical: Stage IVB (cT4a, cN3b, cM0) - Signed by Derek Jack, MD on 05/30/2019 - Pathologic stage from 06/07/2019: Stage IVB (pT4a, pN3b, cM0) - Signed by Eppie Gibson, MD on 06/07/2019    INTERVAL HISTORY:  Terri [name] 68 y.o. female returns for routine follow-up for tongue cancer.  Patient reports she has been doing well since her treatment yesterday.  She did report a slight ringing in her ears yesterday but it has since stopped.  It did not last long. Denies any nausea, vomiting, or diarrhea. Denies any new pains. Had not noticed any recent bleeding such as epistaxis, hematuria or hematochezia. Denies recent chest pain on exertion, shortness of breath on minimal exertion, pre-syncopal episodes, or palpitations. Denies any numbness or tingling in hands or feet. Denies any recent fevers, infections, or recent hospitalizations. Patient reports appetite at 25% and energy level at 50%.  She is drinking 4 cans of Ensure daily and that is the only attrition she is in taking at this time.     REVIEW OF SYSTEMS:  Review of Systems  Gastrointestinal: Positive for constipation.  All other systems reviewed and are negative.    PAST MEDICAL/SURGICAL HISTORY:  Past Medical History:  Diagnosis Date  . Hypertension   . Port-A-Cath in place 06/14/2019   Past Surgical History:  Procedure Laterality Date  . 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 Dr. Nicolette Bang at [facility]  . PORTACATH PLACEMENT Left  06/10/2019   Procedure: INSERTION PORT-A-CATH;  Surgeon: Aviva Signs, MD;  Location: [facility];  Service: General;  Laterality: Left;  pt knows to arrive at 6:15  . TUBAL LIGATION       SOCIAL HISTORY:  Social History   Socioeconomic History  . Marital status: Widowed    Spouse name: Not on file  . Number of children: 2  .  Years of education: Not on file  . Highest education level: Not on file  Occupational History  . Not on file  Social Needs  . Financial resource strain: Not hard at all  . Food insecurity    Worry: Never true    Inability: Never true  . Transportation needs    Medical: No    Non-medical: No  Tobacco Use  . Smoking status: Never Smoker  . Smokeless tobacco: Never Used  Substance and Sexual Activity  . Alcohol use: Never    Frequency: Never  . Drug use: Never  . Sexual activity: Not on file  Lifestyle  . Physical activity    Days per week: 0 days    Minutes per session: 0 min  . Stress: Only a little  Relationships  . Social connections    Talks on phone: More than three times a week    Gets together: More than three times a week    Attends religious service: More than 4 times per year    Active member of club or organization: Yes    Attends meetings of clubs or organizations: More than 4 times per year    Relationship status: Widowed  . Intimate partner violence    Fear of current or ex partner: No    Emotionally abused: No    Physically abused: No    Forced sexual activity: No  Other Topics Concern  . Not on file  Social History Narrative      Patient is a widow.   Patient lives with her 2 sons in [place], [place].   Patient is a non-smoker, nondrinker.  Patient has never used chew.  Patient does not use illicit drugs.    FAMILY HISTORY:  Family History  Problem Relation Age of Onset  . Heart failure Mother   . Heart failure Father     CURRENT MEDICATIONS:  Outpatient Encounter Medications as of 07/26/2019  Medication Sig  . amLODipine (NORVASC) 5 MG tablet Take 5 mg by mouth daily.  [name] CISPLATIN IV Inject into the vein once a week.  . diclofenac (VOLTAREN) 75 MG EC tablet Take 75 mg by mouth 2 (two) times daily as needed (pain).   . Multiple Vitamins-Minerals (MULTIVITAMIN WITH MINERALS) tablet Take 1 tablet by mouth daily.  [name] omeprazole (PRILOSEC) 40 MG  capsule Take 40 mg by mouth daily.   [name] acetaminophen (TYLENOL) 500 MG tablet Take 500 mg by mouth every 8 (eight) hours as needed (pain).   . chlorhexidine (PERIDEX) 0.12 % solution Use as directed 15 mLs in the mouth or throat 2 (two) times daily. (Patient not taking: Reported on 07/26/2019)  . dronabinol (MARINOL) 5 MG capsule Take 1 capsule (5 mg total) by mouth 2 (two) times daily before a meal. (Patient not taking: Reported on 07/26/2019)  . HYDROcodone-acetaminophen (NORCO) 5-325 MG tablet Take 1 tablet by mouth every 4 (four) hours as needed for moderate pain. (Patient not taking: Reported on 07/26/2019)  . lidocaine (XYLOCAINE) 2 % solution Patient: Mix 1part 2% viscous lidocaine, 1part H20. Swish & swallow 45m of diluted  mixture, 52mn before meals and at bedtime, up to QID. May otherwise swish and spit up to 8 times daily. (Patient not taking: Reported on 07/26/2019)  . lidocaine-prilocaine (EMLA) cream Apply small amount to port a cath site and cover with plastic wrap one hour prior to chemotherapy appointments. (Patient not taking: Reported on 07/26/2019)  . prochlorperazine (COMPAZINE) 10 MG tablet Take 1 tablet (10 mg total) by mouth every 6 (six) hours as needed (Nausea or vomiting). (Patient not taking: Reported on 07/26/2019)  . [DISCONTINUED] prochlorperazine (COMPAZINE) 10 MG tablet Take 1 tablet (10 mg total) by mouth every 6 (six) hours as needed for nausea or vomiting.   Facility-Administered Encounter Medications as of 07/26/2019  Medication  . heparin lock flush 100 unit/mL  . heparin lock flush 100 unit/mL  . sodium chloride 0.9 % 1,000 mL with potassium chloride 20 mEq, magnesium sulfate 2 g infusion  . sodium chloride 0.9 % 1,000 mL with potassium chloride 20 mEq, magnesium sulfate 2 g infusion  . sodium chloride flush (NS) 0.9 % injection 10 mL  . [COMPLETED] sodium chloride flush (NS) 0.9 % injection 10 mL    ALLERGIES:  No Known Allergies   PHYSICAL EXAM:  ECOG  Performance status: 1  Vitals:   07/26/19 1149  BP: (!) 144/77  Pulse: 67  Resp: 20  Temp: 97.6 F (36.4 C)  SpO2: 100%   Filed Weights   07/26/19 1149  Weight: 175 lb 1.6 oz (79.4 kg)    Physical Exam Constitutional:      Appearance: Normal appearance. She is normal weight.  Cardiovascular:     Rate and Rhythm: Normal rate and regular rhythm.     Heart sounds: Normal heart sounds.  Pulmonary:     Effort: Pulmonary effort is normal.     Breath sounds: Normal breath sounds.  Abdominal:     General: Bowel sounds are normal.     Palpations: Abdomen is soft.  Musculoskeletal: Normal range of motion.  Skin:    General: Skin is warm.  Neurological:     Mental Status: She is alert and oriented to person, place, and time. Mental status is at baseline.  Psychiatric:        Mood and Affect: Mood normal.        Behavior: Behavior normal.        Thought Content: Thought content normal.        Judgment: Judgment normal.      LABORATORY DATA:  I have reviewed the labs as listed.  CBC    Component Value Date/Time   WBC 1.9 (L) 07/25/2019 0953   RBC 4.24 07/25/2019 0953   HGB 12.3 07/25/2019 0953   HCT 37.5 07/25/2019 0953   PLT 101 (L) 07/25/2019 0953   MCV 88.4 07/25/2019 0953   MCH 29.0 07/25/2019 0953   MCHC 32.8 07/25/2019 0953   RDW 13.1 07/25/2019 0953   LYMPHSABS 0.2 (L) 07/25/2019 0953   MONOABS 0.2 07/25/2019 0953   EOSABS 0.0 07/25/2019 0953   BASOSABS 0.0 07/25/2019 0953   CMP Latest Ref Rng & Units 07/25/2019 07/18/2019 07/11/2019  Glucose 70 - 99 mg/dL 90 82 82  BUN 8 - 23 mg/dL _0 Creatinine 0.44 - 1.00 mg/dL 0.85 0.89 0.82  Sodium 135 - 145 mmol/L 136 135 133(L)  Potassium 3.5 - 5.1 mmol/L 4.0 3.9 4.0  Chloride 98 - 111 mmol/L 100 100 100  CO2 22 - 32 mmol/L _1 Calcium 8.9 -  10.3 mg/dL 9.1 9.0 9.1  Total Protein 6.5 - 8.1 g/dL 6.9 7.0 7.2  Total Bilirubin 0.3 - 1.2 mg/dL 0.4 0.6 0.4  Alkaline Phos 38 - 126 U/L 53 55 57  AST 15 - 41  U/L _0 ALT 0 - 44 U/L 34 32 38     I personally performed a face-to-face visit.  All questions were answered to patient's stated satisfaction. Encouraged patient to call with any new concerns or questions before his next visit to the cancer center and we can certain see him sooner, if needed.     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 Nicolette Bang at [facility]. -Pathology showed 4.2 cm tumor, margins negative, 17 mm depth of invasion, moderately to poorly differentiated, positive neutral invasion, 2/17 lymph nodes positive, ECE positive, pT4a, PN 3B. -Will obtain p16 staining results from [facility]. -Because of adverse features including ECE, positive perineural invasion, T4 primary and N3 disease, she was recommended systemic therapy with RT as a category 1 recommendation. -IMRT 60 Gy in 30 fractions started on 06/27/2019. -Cisplatin weekly started on 06/27/2019. -She has lost 9 pounds the last 3 weeks.  She does not have an appetite.  However she is making herself drink 4 cans of Ensure daily. -She did report a slight ringing in her years yesterday but it has stopped now. -She received her fifth treatment yesterday.  She is here today to receive IV fluids. -We will see her back next week with office visit labs and treatment.  2.  Nutrition: -She reported loss of appetite.  She has lost 9 pounds over the last 3 weeks. -She was prescribed Marinol on 07/13/2019.  She is taking it twice a day.  She reports she has not noticed any improvement in her appetite yet. -She spoke with our dietitian.  She is drinking 4 cans of Ensure per day. -She reports her taste and smell are fine its more not wanting food.      Orders placed this encounter:  Orders Placed This  Encounter  Procedures  . Lactate dehydrogenase  . Magnesium  . CBC with Differential/Platelet  . Comprehensive metabolic panel      Francene Finders, FNP-C [facility] [phone]

## 2019-07-27 ENCOUNTER — Other Ambulatory Visit: Payer: Self-pay

## 2019-07-27 ENCOUNTER — Ambulatory Visit (HOSPITAL_COMMUNITY): Payer: BC Managed Care – PPO

## 2019-07-27 ENCOUNTER — Ambulatory Visit
Admission: RE | Admit: 2019-07-27 | Discharge: 2019-07-27 | Disposition: A | Discharge status: Home / Self Care | Payer: BC Managed Care – PPO | Source: Ambulatory Visit | Attending: Radiation Oncology | Admitting: Radiation Oncology

## 2019-07-27 DIAGNOSIS — C023 Malignant neoplasm of anterior two-thirds of tongue, part unspecified: Secondary | ICD-10-CM | POA: Diagnosis not present

## 2019-07-28 ENCOUNTER — Ambulatory Visit
Admission: RE | Admit: 2019-07-28 | Discharge: 2019-07-28 | Disposition: A | Discharge status: Home / Self Care | Payer: BC Managed Care – PPO | Source: Ambulatory Visit | Attending: Radiation Oncology | Admitting: Radiation Oncology

## 2019-07-28 ENCOUNTER — Other Ambulatory Visit: Payer: Self-pay

## 2019-07-28 DIAGNOSIS — C023 Malignant neoplasm of anterior two-thirds of tongue, part unspecified: Secondary | ICD-10-CM | POA: Diagnosis not present

## 2019-07-29 ENCOUNTER — Ambulatory Visit
Admission: RE | Admit: 2019-07-29 | Discharge: 2019-07-29 | Disposition: A | Discharge status: Home / Self Care | Payer: BC Managed Care – PPO | Source: Ambulatory Visit | Attending: Radiation Oncology | Admitting: Radiation Oncology

## 2019-07-29 ENCOUNTER — Other Ambulatory Visit: Payer: Self-pay

## 2019-07-29 ENCOUNTER — Ambulatory Visit (HOSPITAL_COMMUNITY): Payer: BC Managed Care – PPO

## 2019-07-29 DIAGNOSIS — C023 Malignant neoplasm of anterior two-thirds of tongue, part unspecified: Secondary | ICD-10-CM | POA: Diagnosis not present

## 2019-07-29 NOTE — Progress Notes (Signed)
Nutrition Follow-up:  Patient with SCC of tongue s/p right excision and right neck dissection on 8/7 at [facility].  Patient receiving chemotherapy and radiation.    Spoke with patient via phone.  Reports that she is drinking about 3 ensure per day and water.  Trying to eat small amounts of solid foods (mashed potatoes, applesauce).  Reports that she has a desire to eat but taste is off.  Reports that she does not have much  Nausea and she does not feel bad.  Continues to drive herself to radiation treatment each day.      Medications: marinol  Labs: reviewed  Anthropometrics:   Weight 175 lb 1.6 oz on 10/20.    Starting weight of 188 on 9/21.  7% weight loss in the last month, signficiant   NUTRITION DIAGNOSIS: Inadequate oral intake continues    INTERVENTION:  Patient has declined feeding tube.  Encouraged patient to set schedule to try and eat/drink q 1-2 hours.   Recommend ensure enlive at least 6 per day to better meet nutritional needs Recommend patient add protein powder to foods such as mashed potatoes, applesauce with no protein.   Will provide another case of ensure enlive for patient to pick up on Monday, October 26 Patient has contact information    MONITORING, EVALUATION, GOAL: oral intake and weight   NEXT VISIT: Friday, October 30, phone  Ernesto Lashway B. Zenia Resides, [place], [place] Registered Dietitian [phone] (pager)

## 2019-08-01 ENCOUNTER — Inpatient Hospital Stay (HOSPITAL_COMMUNITY): Payer: BC Managed Care – PPO

## 2019-08-01 ENCOUNTER — Encounter (HOSPITAL_COMMUNITY): Payer: Self-pay | Admitting: Hematology

## 2019-08-01 ENCOUNTER — Ambulatory Visit
Admission: RE | Admit: 2019-08-01 | Discharge: 2019-08-01 | Disposition: A | Discharge status: Home / Self Care | Payer: BC Managed Care – PPO | Source: Ambulatory Visit | Attending: Radiation Oncology | Admitting: Radiation Oncology

## 2019-08-01 ENCOUNTER — Other Ambulatory Visit: Payer: Self-pay

## 2019-08-01 ENCOUNTER — Inpatient Hospital Stay (HOSPITAL_BASED_OUTPATIENT_CLINIC_OR_DEPARTMENT_OTHER): Payer: BC Managed Care – PPO | Admitting: Hematology

## 2019-08-01 VITALS — BP 137/63 | HR 66 | Temp 96.9°F | Resp 18 | Wt 171.2 lb

## 2019-08-01 DIAGNOSIS — C023 Malignant neoplasm of anterior two-thirds of tongue, part unspecified: Secondary | ICD-10-CM | POA: Diagnosis not present

## 2019-08-01 DIAGNOSIS — C021 Malignant neoplasm of border of tongue: Secondary | ICD-10-CM

## 2019-08-01 DIAGNOSIS — Z95828 Presence of other vascular implants and grafts: Secondary | ICD-10-CM

## 2019-08-01 LAB — COMPREHENSIVE METABOLIC PANEL
ALT: 31 U/L (ref 0–44)
AST: 22 U/L (ref 15–41)
Albumin: 3.7 g/dL (ref 3.5–5.0)
Alkaline Phosphatase: 54 U/L (ref 38–126)
Anion gap: 12 (ref 5–15)
BUN: 15 mg/dL (ref 8–23)
CO2: 24 mmol/L (ref 22–32)
Calcium: 9 mg/dL (ref 8.9–10.3)
Chloride: 99 mmol/L (ref 98–111)
Creatinine, Ser: 0.72 mg/dL (ref 0.44–1.00)
GFR calc Af Amer: >60 mL/min (ref >60)
GFR calc non Af Amer: >60 mL/min (ref >60)
Glucose, Bld: 84 mg/dL (ref 70–99)
Potassium: 3.9 mmol/L (ref 3.5–5.1)
Sodium: 135 mmol/L (ref 135–145)
Total Bilirubin: 0.6 mg/dL (ref 0.3–1.2)
Total Protein: 6.7 g/dL (ref 6.5–8.1)

## 2019-08-01 LAB — CBC WITH DIFFERENTIAL/PLATELET
Abs Immature Granulocytes: 0.01 10*3/uL (ref 0.00–0.07)
Basophils Absolute: 0 10*3/uL (ref 0.0–0.1)
Basophils Relative: 0 %
Eosinophils Absolute: 0 10*3/uL (ref 0.0–0.5)
Eosinophils Relative: 2 %
HCT: 33.8 % — ABNORMAL LOW (ref 36.0–46.0)
Hemoglobin: 11.3 g/dL — ABNORMAL LOW (ref 12.0–15.0)
Immature Granulocytes: 1 %
Lymphocytes Relative: 16 %
Lymphs Abs: 0.2 10*3/uL — ABNORMAL LOW (ref 0.7–4.0)
MCH: 29.4 pg (ref 26.0–34.0)
MCHC: 33.4 g/dL (ref 30.0–36.0)
MCV: 88 fL (ref 80.0–100.0)
Monocytes Absolute: 0.2 10*3/uL (ref 0.1–1.0)
Monocytes Relative: 14 %
Neutro Abs: 0.7 10*3/uL — ABNORMAL LOW (ref 1.7–7.7)
Neutrophils Relative %: 67 %
Platelets: 98 10*3/uL — ABNORMAL LOW (ref 150–400)
RBC: 3.84 MIL/uL — ABNORMAL LOW (ref 3.87–5.11)
RDW: 13.2 % (ref 11.5–15.5)
WBC: 1.1 10*3/uL — CL (ref 4.0–10.5)
nRBC: 0 % (ref 0.0–0.2)

## 2019-08-01 LAB — MAGNESIUM: Magnesium: 1.6 mg/dL — ABNORMAL LOW (ref 1.7–2.4)

## 2019-08-01 MED ORDER — FLUCONAZOLE 100 MG PO TABS: 100.0000 mg | ORAL_TABLET | Freq: Every day | ORAL | 0 refills | Status: AC

## 2019-08-01 MED ORDER — HEPARIN SOD (PORK) LOCK FLUSH 100 UNIT/ML IV SOLN
500.0000 [IU] | Freq: Once | INTRAVENOUS | Status: AC
  Administered 2019-08-01: 500 [IU] via INTRAVENOUS

## 2019-08-01 MED ORDER — POTASSIUM CHLORIDE 2 MEQ/ML IV SOLN
Freq: Once | INTRAVENOUS | Status: AC
  Administered 2019-08-01: 11:00:00 via INTRAVENOUS
  Filled 2019-08-01: qty 10

## 2019-08-01 MED ORDER — HYDROCODONE-ACETAMINOPHEN 5-325 MG PO TABS
1.0000 | ORAL_TABLET | Freq: Once | ORAL | Status: AC
  Administered 2019-08-01: 1 via ORAL

## 2019-08-01 MED ORDER — HYDROCODONE-ACETAMINOPHEN 5-325 MG PO TABS
ORAL_TABLET | ORAL | Status: AC
  Filled 2019-08-01: qty 1

## 2019-08-01 MED ORDER — SODIUM CHLORIDE 0.9 % IV SOLN: Freq: Once | INTRAVENOUS | Status: DC

## 2019-08-01 NOTE — Patient Instructions (Signed)
[facility]  Discharge Instructions:   _______________________________________________________________  Thank you for choosing [facility] at [facility] to provide your oncology and hematology care.  To afford each patient quality time with our providers, please arrive at least 15 minutes before your scheduled appointment.  You need to re-schedule your appointment if you arrive 10 or more minutes late.  We strive to give you quality time with our providers, and arriving late affects you and other patients whose appointments are after yours.  Also, if you no show three or more times for appointments you may be dismissed from the clinic.  Again, thank you for choosing [facility] at [facility]. Our hope is that these requests will allow you access to exceptional care and in a timely manner. _______________________________________________________________  If you have questions after your visit, please contact our office at [phone] between the hours of 8:30 a.m. and 5:00 p.m. Voicemails left after 4:30 p.m. will not be returned until the following business day. _______________________________________________________________  For prescription refill requests, have your pharmacy contact our office. _______________________________________________________________  Recommendations made by the consultant and any test results will be sent to your referring physician. _______________________________________________________________ 

## 2019-08-01 NOTE — Progress Notes (Signed)
No treatment today . Patient is done with treatment per MD. She will be done with radiation this Friday. Hydration fluids today per MD.    Patient tolerated it well without problems. Vitals stable and discharged home from clinic ambulatory. Follow up as scheduled.

## 2019-08-01 NOTE — Assessment & Plan Note (Signed)
1. 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 Nicolette Bang at [facility] -Pathology showed 4.2 cm tumor, margins negative, 17 mm depth of invasion, moderately to poorly differentiated, positive neutral invasion, 2/17 lymph nodes positive, ECE positive, pT4a, PN 3B. -Will obtain p16 staining results from [facility]. -Because of adverse features including ECE, positive perineural invasion, T4 primary and N3 disease, she was recommended systemic therapy with RT as a category 1 recommendation. -IMRT 60 Gy in 30 fractions started on 06/27/2019. -Cisplatin weekly started on 06/27/2019. - She completed 5 cycles of weekly cisplatin on 07/25/19.  - We will hold off on any additional cycles due to neutropenias.  - She will complete radiation this week.  - Plan to see her back in 1 week for repeat labs and office visit.  2. Oral Thrush - Secondary to treatment. - Prescribed diflucan 100 mg daily x 5 days.    3. Nutrition: -She reported loss of appetite.  She has lost 9 pounds over the last 3 weeks. -She was prescribed Marinol on 07/13/2019.  She is taking it twce a day.   -She spoke with our dietitian.  She is drinking 4 cans of Ensure per day.

## 2019-08-01 NOTE — Progress Notes (Signed)
[place], [place] [address]   CLINIC:  Medical Oncology/Hematology  PCP:  [name], DO [address] [name] [address] [phone]   REASON FOR VISIT:  Follow-up for right tongue cancer.     INTERVAL HISTORY:  [name] 68 y.o. female seen for follow-up of tongue cancer.  She will reportedly complete radiation on 08/05/2019.  She reported pain in the tongue region.  She also complains of thick mucus in the mouth region.  She is not able to eat much.  She is drinking about 3 to 4 cans of Ensure daily.  She has not lost much weight from last visit last week.  She lost about 1 pound.  She denies any tingling or numbness in the extremities.  However she has some ringing in the ears lasting less than 10 seconds.  REVIEW OF SYSTEMS:  Review of Systems  HENT:   Positive for trouble swallowing.   All other systems reviewed and are negative.    PAST MEDICAL/SURGICAL HISTORY:  Past Medical History:  Diagnosis Date  . Hypertension   . Port-A-Cath in place 06/14/2019   Past Surgical History:  Procedure Laterality Date  . 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 Dr. Nicolette Bang at [facility]  . PORTACATH PLACEMENT Left 06/10/2019   Procedure: INSERTION PORT-A-CATH;  Surgeon: Aviva Signs, MD;  Location: [facility];  Service: General;  Laterality: Left;  pt knows to arrive at 6:15  . TUBAL LIGATION       SOCIAL HISTORY:  Social History   Socioeconomic History  . Marital status: Widowed    Spouse name: Not on file  . Number of children: 2  . Years of education: Not on file  . Highest education level: Not on file  Occupational History  . Not on file  Social Needs  . Financial resource strain: Not hard at all  . Food insecurity    Worry: Never true    Inability: Never true  . Transportation needs    Medical: No    Non-medical: No   Tobacco Use  . Smoking status: Never Smoker  . Smokeless tobacco: Never Used  Substance and Sexual Activity  . Alcohol use: Never    Frequency: Never  . Drug use: Never  . Sexual activity: Not on file  Lifestyle  . Physical activity    Days per week: 0 days    Minutes per session: 0 min  . Stress: Only a little  Relationships  . Social connections    Talks on phone: More than three times a week    Gets together: More than three times a week    Attends religious service: More than 4 times per year    Active member of club or organization: Yes    Attends meetings of clubs or organizations: More than 4 times per year    Relationship status: Widowed  . Intimate partner violence    Fear of current or ex partner: No    Emotionally abused: No    Physically abused: No    Forced sexual activity: No  Other Topics Concern  . Not on file  Social History Narrative      Patient is a widow.   Patient lives with her 2 sons in [place], [place].   Patient is a non-smoker, nondrinker.  Patient has never used chew.  Patient does not  use illicit drugs.    FAMILY HISTORY:  Family History  Problem Relation Age of Onset  . Heart failure Mother   . Heart failure Father     CURRENT MEDICATIONS:  Outpatient Encounter Medications as of 08/01/2019  Medication Sig  . amLODipine (NORVASC) 5 MG tablet Take 5 mg by mouth daily.  . chlorhexidine (PERIDEX) 0.12 % solution Use as directed 15 mLs in the mouth or throat 2 (two) times daily.  [name] CISPLATIN IV Inject into the vein once a week.  . dronabinol (MARINOL) 5 MG capsule Take 1 capsule (5 mg total) by mouth 2 (two) times daily before a meal.  . Multiple Vitamins-Minerals (MULTIVITAMIN WITH MINERALS) tablet Take 1 tablet by mouth daily.  [name] omeprazole (PRILOSEC) 40 MG capsule Take 40 mg by mouth daily.   [name] acetaminophen (TYLENOL) 500 MG tablet Take 500 mg by mouth every 8 (eight) hours as needed (pain).   [name] diclofenac (VOLTAREN) 75 MG EC tablet  Take 75 mg by mouth 2 (two) times daily as needed (pain).   . fluconazole (DIFLUCAN) 100 MG tablet Take 1 tablet (100 mg total) by mouth daily for 5 doses.  [name] HYDROcodone-acetaminophen (NORCO) 5-325 MG tablet Take 1 tablet by mouth every 4 (four) hours as needed for moderate pain. (Patient not taking: Reported on 07/26/2019)  . lidocaine (XYLOCAINE) 2 % solution Patient: Mix 1part 2% viscous lidocaine, 1part H20. Swish & swallow 57mL of diluted mixture, 80min before meals and at bedtime, up to QID. May otherwise swish and spit up to 8 times daily. (Patient not taking: Reported on 07/26/2019)  . lidocaine-prilocaine (EMLA) cream Apply small amount to port a cath site and cover with plastic wrap one hour prior to chemotherapy appointments. (Patient not taking: Reported on 07/26/2019)  . prochlorperazine (COMPAZINE) 10 MG tablet Take 1 tablet (10 mg total) by mouth every 6 (six) hours as needed (Nausea or vomiting). (Patient not taking: Reported on 07/26/2019)  . [DISCONTINUED] hydrochlorothiazide (HYDRODIURIL) 25 MG tablet Take 25 mg by mouth daily.   Facility-Administered Encounter Medications as of 08/01/2019  Medication  . [COMPLETED] dextrose 5 % and 0.45% NaCl 1,000 mL with potassium chloride 20 mEq, magnesium sulfate 12 mEq infusion  . heparin lock flush 100 unit/mL  . [COMPLETED] heparin lock flush 100 unit/mL  . sodium chloride 0.9 % 1,000 mL with potassium chloride 20 mEq, magnesium sulfate 2 g infusion  . sodium chloride flush (NS) 0.9 % injection 10 mL  . [DISCONTINUED] 0.9 %  sodium chloride infusion    ALLERGIES:  No Known Allergies   PHYSICAL EXAM:  ECOG Performance status: 0  There were no vitals filed for this visit. There were no vitals filed for this visit.  Physical Exam Vitals signs reviewed.  Constitutional:      Appearance: Normal appearance.  HENT:     Mouth/Throat:     Comments: Right lateral tongue is well-healed.   Cardiovascular:     Rate and Rhythm:  Normal rate and regular rhythm.     Heart sounds: Normal heart sounds.  Pulmonary:     Effort: Pulmonary effort is normal.     Breath sounds: Normal breath sounds.  Abdominal:     General: There is no distension.     Palpations: Abdomen is soft. There is no mass.  Musculoskeletal:        General: No swelling.  Lymphadenopathy:     Cervical: No cervical adenopathy.  Skin:    General: Skin is warm.  Neurological:  General: No focal deficit present.     Mental Status: She is alert and oriented to person, place, and time.  Psychiatric:        Mood and Affect: Mood normal.        Behavior: Behavior normal.   Oral thrush present.   LABORATORY DATA:  I have reviewed the labs as listed.  CBC    Component Value Date/Time   WBC 1.1 (LL) 08/01/2019 1036   RBC 3.84 (L) 08/01/2019 1036   HGB 11.3 (L) 08/01/2019 1036   HCT 33.8 (L) 08/01/2019 1036   PLT 98 (L) 08/01/2019 1036   MCV 88.0 08/01/2019 1036   MCH 29.4 08/01/2019 1036   MCHC 33.4 08/01/2019 1036   RDW 13.2 08/01/2019 1036   LYMPHSABS 0.2 (L) 08/01/2019 1036   MONOABS 0.2 08/01/2019 1036   EOSABS 0.0 08/01/2019 1036   BASOSABS 0.0 08/01/2019 1036   CMP Latest Ref Rng & Units 08/01/2019 07/25/2019 07/18/2019  Glucose 70 - 99 mg/dL 84 90 82  BUN 8 - 23 mg/dL 15 21 16   Creatinine 0.44 - 1.00 mg/dL 0.72 0.85 0.89  Sodium 135 - 145 mmol/L 135 136 135  Potassium 3.5 - 5.1 mmol/L 3.9 4.0 3.9  Chloride 98 - 111 mmol/L 99 100 100  CO2 22 - 32 mmol/L 24 26 25   Calcium 8.9 - 10.3 mg/dL 9.0 9.1 9.0  Total Protein 6.5 - 8.1 g/dL 6.7 6.9 7.0  Total Bilirubin 0.3 - 1.2 mg/dL 0.6 0.4 0.6  Alkaline Phos 38 - 126 U/L 54 53 55  AST 15 - 41 U/L 22 24 23   ALT 0 - 44 U/L 31 34 32       DIAGNOSTIC IMAGING:  I have independently reviewed the scans and discussed with the patient.   I have reviewed Venita Lick LPN's note and agree with the documentation.  I personally performed a face-to-face visit, made revisions and my  assessment and plan is as follows.    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 Nicolette Bang at [facility]. -Pathology showed 4.2 cm tumor, margins negative, 17 mm depth of invasion, moderately to poorly differentiated, positive neutral invasion, 2/17 lymph nodes positive, ECE positive, pT4a, PN 3B. -Will obtain p16 staining results from [facility]. -Because of adverse features including ECE, positive perineural invasion, T4 primary and N3 disease, she was recommended systemic therapy with RT as a category 1 recommendation. -IMRT 60 Gy in 30 fractions started on 06/27/2019. -Cisplatin weekly started on 06/27/2019. - She completed 5 cycles of weekly cisplatin on 07/25/19.  - We will hold off on any additional cycles due to neutropenias.  - She will complete radiation this week.  - Plan to see her back in 1 week for repeat labs and office visit.  2. Oral Thrush - Secondary to treatment. - Prescribed diflucan 100 mg daily x 5 days.    3. Nutrition: -She reported loss of appetite.  She has lost 9 pounds over the last 3 weeks. -She was prescribed Marinol on 07/13/2019.  She is taking it twce a day.   -She spoke with our dietitian.  She is drinking 4 cans of Ensure per day.  Total time spent is 25 minutes with more than 50% of the time spent face-to-face discussing treatment plan, counseling and coordination of  care.    Orders placed this encounter:  No orders of the defined types were placed in this encounter.     Derek Jack, MD [facility] [phone]

## 2019-08-01 NOTE — Patient Instructions (Addendum)
[facility] at [facility] Discharge Instructions  You were seen today by Dr. Katragadda. He went over your recent lab results. He will see you back in 1 week for labs and follow up.   Thank you for choosing [facility] at [facility] to provide your oncology and hematology care.  To afford each patient quality time with our provider, please arrive at least 15 minutes before your scheduled appointment time.   If you have a lab appointment with the Cancer Center please come in thru the  Main Entrance and check in at the main information desk  You need to re-schedule your appointment should you arrive 10 or more minutes late.  We strive to give you quality time with our providers, and arriving late affects you and other patients whose appointments are after yours.  Also, if you no show three or more times for appointments you may be dismissed from the clinic at the providers discretion.     Again, thank you for choosing [facility].  Our hope is that these requests will decrease the amount of time that you wait before being seen by our physicians.       _____________________________________________________________  Should you have questions after your visit to [facility], please contact our office at [phone] between the hours of 8:00 a.m. and 4:30 p.m.  Voicemails left after 4:00 p.m. will not be returned until the following business day.  For prescription refill requests, have your pharmacy contact our office and allow 72 hours.    Cancer Center Support Programs:   > Cancer Support Group  2nd Tuesday of the month 1pm-2pm, Journey Room    

## 2019-08-02 ENCOUNTER — Ambulatory Visit (HOSPITAL_COMMUNITY): Payer: BC Managed Care – PPO

## 2019-08-02 ENCOUNTER — Ambulatory Visit
Admission: RE | Admit: 2019-08-02 | Discharge: 2019-08-02 | Disposition: A | Discharge status: Home / Self Care | Payer: BC Managed Care – PPO | Source: Ambulatory Visit | Attending: Radiation Oncology | Admitting: Radiation Oncology

## 2019-08-02 ENCOUNTER — Other Ambulatory Visit: Payer: Self-pay

## 2019-08-02 DIAGNOSIS — C023 Malignant neoplasm of anterior two-thirds of tongue, part unspecified: Secondary | ICD-10-CM | POA: Diagnosis not present

## 2019-08-03 ENCOUNTER — Ambulatory Visit (HOSPITAL_COMMUNITY): Payer: BC Managed Care – PPO

## 2019-08-03 ENCOUNTER — Telehealth: Payer: Self-pay | Admitting: *Deleted

## 2019-08-03 ENCOUNTER — Ambulatory Visit
Admission: RE | Admit: 2019-08-03 | Discharge: 2019-08-03 | Disposition: A | Discharge status: Home / Self Care | Payer: BC Managed Care – PPO | Source: Ambulatory Visit | Attending: Radiation Oncology | Admitting: Radiation Oncology

## 2019-08-03 ENCOUNTER — Other Ambulatory Visit: Payer: Self-pay

## 2019-08-03 ENCOUNTER — Inpatient Hospital Stay (HOSPITAL_COMMUNITY): Payer: BC Managed Care – PPO

## 2019-08-03 VITALS — BP 128/69 | HR 79 | Temp 97.1°F | Resp 16

## 2019-08-03 DIAGNOSIS — C023 Malignant neoplasm of anterior two-thirds of tongue, part unspecified: Secondary | ICD-10-CM | POA: Diagnosis not present

## 2019-08-03 DIAGNOSIS — C021 Malignant neoplasm of border of tongue: Secondary | ICD-10-CM

## 2019-08-03 LAB — CBC WITH DIFFERENTIAL/PLATELET
Abs Immature Granulocytes: 0 10*3/uL (ref 0.00–0.07)
Basophils Absolute: 0 10*3/uL (ref 0.0–0.1)
Basophils Relative: 1 %
Eosinophils Absolute: 0 10*3/uL (ref 0.0–0.5)
Eosinophils Relative: 2 %
HCT: 32.9 % — ABNORMAL LOW (ref 36.0–46.0)
Hemoglobin: 11.1 g/dL — ABNORMAL LOW (ref 12.0–15.0)
Immature Granulocytes: 0 %
Lymphocytes Relative: 7 %
Lymphs Abs: 0.1 10*3/uL — ABNORMAL LOW (ref 0.7–4.0)
MCH: 29.6 pg (ref 26.0–34.0)
MCHC: 33.7 g/dL (ref 30.0–36.0)
MCV: 87.7 fL (ref 80.0–100.0)
Monocytes Absolute: 0.2 10*3/uL (ref 0.1–1.0)
Monocytes Relative: 12 %
Neutro Abs: 1.1 10*3/uL — ABNORMAL LOW (ref 1.7–7.7)
Neutrophils Relative %: 78 %
Platelets: 115 10*3/uL — ABNORMAL LOW (ref 150–400)
RBC: 3.75 MIL/uL — ABNORMAL LOW (ref 3.87–5.11)
RDW: 13.4 % (ref 11.5–15.5)
WBC: 1.4 10*3/uL — CL (ref 4.0–10.5)
nRBC: 0 % (ref 0.0–0.2)

## 2019-08-03 LAB — COMPREHENSIVE METABOLIC PANEL
ALT: 26 U/L (ref 0–44)
AST: 18 U/L (ref 15–41)
Albumin: 3.8 g/dL (ref 3.5–5.0)
Alkaline Phosphatase: 58 U/L (ref 38–126)
Anion gap: 11 (ref 5–15)
BUN: 13 mg/dL (ref 8–23)
CO2: 25 mmol/L (ref 22–32)
Calcium: 9 mg/dL (ref 8.9–10.3)
Chloride: 98 mmol/L (ref 98–111)
Creatinine, Ser: 0.83 mg/dL (ref 0.44–1.00)
GFR calc Af Amer: >60 mL/min (ref >60)
GFR calc non Af Amer: >60 mL/min (ref >60)
Glucose, Bld: 81 mg/dL (ref 70–99)
Potassium: 3.6 mmol/L (ref 3.5–5.1)
Sodium: 134 mmol/L — ABNORMAL LOW (ref 135–145)
Total Bilirubin: 0.5 mg/dL (ref 0.3–1.2)
Total Protein: 6.9 g/dL (ref 6.5–8.1)

## 2019-08-03 MED ORDER — SODIUM CHLORIDE 0.9 % IV SOLN
Freq: Once | INTRAVENOUS | Status: AC
  Administered 2019-08-03: 12:00:00 via INTRAVENOUS
  Filled 2019-08-03: qty 10

## 2019-08-03 MED ORDER — HEPARIN SOD (PORK) LOCK FLUSH 100 UNIT/ML IV SOLN
500.0000 [IU] | Freq: Once | INTRAVENOUS | Status: AC | PRN
  Administered 2019-08-03: 500 [IU]

## 2019-08-03 MED ORDER — SODIUM CHLORIDE 0.9% FLUSH
10.0000 mL | Freq: Once | INTRAVENOUS | Status: AC | PRN
  Administered 2019-08-03: 10 mL

## 2019-08-03 NOTE — Telephone Encounter (Signed)
Oncology Nurse Navigator Documentation  Rec'd call from Ms. Yabut.    She reported episode of fainting/vomiting last HS, feeling weak.  In response to her  acknowledgment she is probably dehydrated, I encouraged her to increase fluid intake as able.  I indicated I would call [facility] to inform Dr. Tera Helper, suggested they might want her to come in for assessment s/p RT.  With the understanding she will need to come in on Monday for final tmt, she decided to come in today.   Called [facility] Triage, LVMM with above information, asked for call-back.  Gayleen Orem, RN, BSN Head & Neck Oncology Nurse [place] at [place] [phone]

## 2019-08-03 NOTE — Progress Notes (Signed)
CRITICAL VALUE STICKER  CRITICAL VALUE: WBC 1.4  RECEIVER (on-site recipient of call): Merna Baldi, rn  DATE & TIME NOTIFIED: 08/03/2019 at 1234  MESSENGER (representative from lab):Hillary flynt,   MD NOTIFIED: Delton Coombes  TIME OF NOTIFICATION:1235  RESPONSE: no orders received

## 2019-08-03 NOTE — Progress Notes (Signed)
Patient called this morning stating she passed out last night while sitting at the table. She is requesting hydration fluids. Will notify MD and draw labs and give fluids per protocol.   Hydration fluids given per orders.  Patient tolerated it well without problems. Vitals stable and discharged home from clinic ambulatory. Follow up as scheduled.

## 2019-08-04 ENCOUNTER — Ambulatory Visit
Admission: RE | Admit: 2019-08-04 | Discharge: 2019-08-04 | Disposition: A | Discharge status: Home / Self Care | Payer: BC Managed Care – PPO | Source: Ambulatory Visit | Attending: Radiation Oncology | Admitting: Radiation Oncology

## 2019-08-04 ENCOUNTER — Inpatient Hospital Stay (HOSPITAL_COMMUNITY): Payer: BC Managed Care – PPO

## 2019-08-04 ENCOUNTER — Other Ambulatory Visit: Payer: Self-pay

## 2019-08-04 ENCOUNTER — Encounter (HOSPITAL_COMMUNITY): Payer: Self-pay

## 2019-08-04 VITALS — BP 135/64 | HR 74 | Temp 97.2°F | Resp 18

## 2019-08-04 DIAGNOSIS — C023 Malignant neoplasm of anterior two-thirds of tongue, part unspecified: Secondary | ICD-10-CM | POA: Diagnosis not present

## 2019-08-04 DIAGNOSIS — C021 Malignant neoplasm of border of tongue: Secondary | ICD-10-CM

## 2019-08-04 MED ORDER — SODIUM CHLORIDE 0.9 % IV SOLN
Freq: Once | INTRAVENOUS | Status: AC
  Administered 2019-08-04: 12:00:00 via INTRAVENOUS
  Filled 2019-08-04: qty 10

## 2019-08-04 MED ORDER — HEPARIN SOD (PORK) LOCK FLUSH 100 UNIT/ML IV SOLN
500.0000 [IU] | Freq: Once | INTRAVENOUS | Status: AC | PRN
  Administered 2019-08-04: 500 [IU]

## 2019-08-04 MED ORDER — SODIUM CHLORIDE 0.9% FLUSH
10.0000 mL | Freq: Once | INTRAVENOUS | Status: AC | PRN
  Administered 2019-08-04: 10 mL

## 2019-08-04 NOTE — Progress Notes (Signed)
Patient tolerated hydration with no complaints voiced.  Port site clean and dry with good blood return noted before and after hydration.  No bruising or swelling noted with port.  Band aid applied.  VSS with discharge and left ambulatory with no s/s of distress noted.

## 2019-08-05 ENCOUNTER — Other Ambulatory Visit: Payer: Self-pay

## 2019-08-05 ENCOUNTER — Ambulatory Visit
Admission: RE | Admit: 2019-08-05 | Discharge: 2019-08-05 | Disposition: A | Discharge status: Home / Self Care | Payer: BC Managed Care – PPO | Source: Ambulatory Visit | Attending: Radiation Oncology | Admitting: Radiation Oncology

## 2019-08-05 ENCOUNTER — Encounter: Payer: Self-pay | Admitting: *Deleted

## 2019-08-05 ENCOUNTER — Encounter: Payer: Self-pay | Admitting: Radiation Oncology

## 2019-08-05 ENCOUNTER — Inpatient Hospital Stay (HOSPITAL_COMMUNITY): Payer: BC Managed Care – PPO

## 2019-08-05 VITALS — BP 139/63 | HR 80 | Temp 97.5°F | Resp 18 | Wt 169.4 lb

## 2019-08-05 DIAGNOSIS — C021 Malignant neoplasm of border of tongue: Secondary | ICD-10-CM

## 2019-08-05 DIAGNOSIS — C023 Malignant neoplasm of anterior two-thirds of tongue, part unspecified: Secondary | ICD-10-CM | POA: Diagnosis not present

## 2019-08-05 MED ORDER — SODIUM CHLORIDE 0.9% FLUSH
10.0000 mL | Freq: Once | INTRAVENOUS | Status: AC | PRN
  Administered 2019-08-05: 10 mL

## 2019-08-05 MED ORDER — SODIUM CHLORIDE 0.9 % IV SOLN
Freq: Once | INTRAVENOUS | Status: AC
  Administered 2019-08-05: 11:00:00 via INTRAVENOUS
  Filled 2019-08-05: qty 10

## 2019-08-05 MED ORDER — HEPARIN SOD (PORK) LOCK FLUSH 100 UNIT/ML IV SOLN
500.0000 [IU] | Freq: Once | INTRAVENOUS | Status: AC | PRN
  Administered 2019-08-05: 13:00:00 500 [IU]

## 2019-08-05 NOTE — Progress Notes (Signed)
Nutrition Follow-up:  Patient with SCC of tongue s/p right excision and right neck dissection on 8/7 at [facility].  Patient has completed radiation (10/30) and chemotherapy (last treatment held).  Met with patient today during IV fluids.  Patient reports that she is drinking 4 ensure enlive per day.  Reports thick, stringy saliva. Intake is limited due to radiation side effects.  Full gingerale sitting at chairside.  Reports that she is planning trying to drink some wonton soup after she leaved the cancer center.  Overall, can't tell RD what she is eating and drinking just says "I am doing the best I can."      Medications: reviewed  Labs: Na 134  Anthropometrics:   Weight 169 lb today decreased from 175 lb 1.6 oz on 10/20.    Starting weight of 188 lb on 9/21  10% weight loss in the last month, significant   NUTRITION DIAGNOSIS: Inadequate oral intake continues   INTERVENTION:  Discussed strategies to help with thick saliva.  Handout provided.  Reviewed foods to add protein powder too (broth based soups, applesauce) Encouraged drinking 6 ensure enlive daily to better meet nutritional needs.  Denied needing any ensure enlive today.  Patient has contact information    MONITORING, EVALUATION, GOAL: oral intake, weight   NEXT VISIT: November 20th phone  Ezma Rehm B. Zenia Resides, [place], [place] Registered Dietitian [phone] (pager)

## 2019-08-05 NOTE — Patient Instructions (Signed)
[facility] at [facility]  Discharge Instructions:   _______________________________________________________________  Thank you for choosing [facility] at [facility] to provide your oncology and hematology care.  To afford each patient quality time with our providers, please arrive at least 15 minutes before your scheduled appointment.  You need to re-schedule your appointment if you arrive 10 or more minutes late.  We strive to give you quality time with our providers, and arriving late affects you and other patients whose appointments are after yours.  Also, if you no show three or more times for appointments you may be dismissed from the clinic.  Again, thank you for choosing [facility] at [facility]. Our hope is that these requests will allow you access to exceptional care and in a timely manner. _______________________________________________________________  If you have questions after your visit, please contact our office at [phone] between the hours of 8:30 a.m. and 5:00 p.m. Voicemails left after 4:30 p.m. will not be returned until the following business day. _______________________________________________________________  For prescription refill requests, have your pharmacy contact our office. _______________________________________________________________  Recommendations made by the consultant and any test results will be sent to your referring physician. _______________________________________________________________ 

## 2019-08-08 NOTE — Progress Notes (Signed)
Oncology Nurse Navigator Documentation  Met with Terri [name] following final RT to offer support and to celebrate end of radiation treatment.   Provided verbal/written post-RT guidance:  Importance of keeping all follow-up appts, especially those with Nutrition and SLP, IVF at [facility].  Importance of protecting treatment area from sun.  Continuation of Sonafine application 2-3 times daily, application of abx ointment to areas of raw skin; when supply of Sonafine exhausted transition to OTC lotion with vitamin E. Provided/reviewed Epic calendar of upcoming appts. Explained my role as navigator will continue for several more months, I will be calling or joining her during follow-up visits.   She agreed to call me with needs/concerns.    Gayleen Orem, RN, BSN Head & Neck Oncology [place] at [place] [phone]

## 2019-08-09 ENCOUNTER — Inpatient Hospital Stay (HOSPITAL_COMMUNITY): Payer: BC Managed Care – PPO | Attending: Hematology

## 2019-08-09 ENCOUNTER — Other Ambulatory Visit: Payer: Self-pay

## 2019-08-09 ENCOUNTER — Encounter (HOSPITAL_COMMUNITY): Payer: Self-pay | Admitting: Nurse Practitioner

## 2019-08-09 ENCOUNTER — Inpatient Hospital Stay (HOSPITAL_BASED_OUTPATIENT_CLINIC_OR_DEPARTMENT_OTHER): Payer: BC Managed Care – PPO | Admitting: Nurse Practitioner

## 2019-08-09 DIAGNOSIS — R1310 Dysphagia, unspecified: Secondary | ICD-10-CM | POA: Insufficient documentation

## 2019-08-09 DIAGNOSIS — Z79899 Other long term (current) drug therapy: Secondary | ICD-10-CM | POA: Insufficient documentation

## 2019-08-09 DIAGNOSIS — R634 Abnormal weight loss: Secondary | ICD-10-CM | POA: Insufficient documentation

## 2019-08-09 DIAGNOSIS — I10 Essential (primary) hypertension: Secondary | ICD-10-CM | POA: Insufficient documentation

## 2019-08-09 DIAGNOSIS — C023 Malignant neoplasm of anterior two-thirds of tongue, part unspecified: Secondary | ICD-10-CM | POA: Diagnosis not present

## 2019-08-09 DIAGNOSIS — K5900 Constipation, unspecified: Secondary | ICD-10-CM | POA: Diagnosis not present

## 2019-08-09 DIAGNOSIS — Z9221 Personal history of antineoplastic chemotherapy: Secondary | ICD-10-CM | POA: Insufficient documentation

## 2019-08-09 DIAGNOSIS — R630 Anorexia: Secondary | ICD-10-CM | POA: Diagnosis not present

## 2019-08-09 DIAGNOSIS — Z791 Long term (current) use of non-steroidal anti-inflammatories (NSAID): Secondary | ICD-10-CM | POA: Diagnosis not present

## 2019-08-09 LAB — COMPREHENSIVE METABOLIC PANEL
ALT: 28 U/L (ref 0–44)
AST: 25 U/L (ref 15–41)
Albumin: 3.9 g/dL (ref 3.5–5.0)
Alkaline Phosphatase: 64 U/L (ref 38–126)
Anion gap: 13 (ref 5–15)
BUN: 22 mg/dL (ref 8–23)
CO2: 23 mmol/L (ref 22–32)
Calcium: 9.2 mg/dL (ref 8.9–10.3)
Chloride: 96 mmol/L — ABNORMAL LOW (ref 98–111)
Creatinine, Ser: 0.9 mg/dL (ref 0.44–1.00)
GFR calc Af Amer: >60 mL/min (ref >60)
GFR calc non Af Amer: >60 mL/min (ref >60)
Glucose, Bld: 82 mg/dL (ref 70–99)
Potassium: 4.3 mmol/L (ref 3.5–5.1)
Sodium: 132 mmol/L — ABNORMAL LOW (ref 135–145)
Total Bilirubin: 0.6 mg/dL (ref 0.3–1.2)
Total Protein: 7.3 g/dL (ref 6.5–8.1)

## 2019-08-09 LAB — CBC WITH DIFFERENTIAL/PLATELET
Abs Immature Granulocytes: 0 10*3/uL (ref 0.00–0.07)
Basophils Absolute: 0 10*3/uL (ref 0.0–0.1)
Basophils Relative: 1 %
Eosinophils Absolute: 0 10*3/uL (ref 0.0–0.5)
Eosinophils Relative: 1 %
HCT: 37.3 % (ref 36.0–46.0)
Hemoglobin: 12.4 g/dL (ref 12.0–15.0)
Immature Granulocytes: 0 %
Lymphocytes Relative: 13 %
Lymphs Abs: 0.1 10*3/uL — ABNORMAL LOW (ref 0.7–4.0)
MCH: 29.5 pg (ref 26.0–34.0)
MCHC: 33.2 g/dL (ref 30.0–36.0)
MCV: 88.6 fL (ref 80.0–100.0)
Monocytes Absolute: 0.3 10*3/uL (ref 0.1–1.0)
Monocytes Relative: 31 %
Neutro Abs: 0.5 10*3/uL — ABNORMAL LOW (ref 1.7–7.7)
Neutrophils Relative %: 54 %
Platelets: 243 10*3/uL (ref 150–400)
RBC: 4.21 MIL/uL (ref 3.87–5.11)
RDW: 14.3 % (ref 11.5–15.5)
WBC: 0.9 10*3/uL — CL (ref 4.0–10.5)
nRBC: 0 % (ref 0.0–0.2)

## 2019-08-09 LAB — LACTATE DEHYDROGENASE: LDH: 135 U/L (ref 98–192)

## 2019-08-09 LAB — MAGNESIUM: Magnesium: 1.7 mg/dL (ref 1.7–2.4)

## 2019-08-09 NOTE — Progress Notes (Signed)
CRITICAL VALUE ALERT  Critical Value:  WBC 0.9  Date & Time Notied:  08/09/19 @ 0908  Provider Notified: RLockamy NP  Orders Received/Actions taken:

## 2019-08-09 NOTE — Progress Notes (Signed)
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 ([name])   05/30/2019 Cancer Staging   Staging form: Oral Cavity, AJCC 8th Edition - Clinical: Stage IVB (cT4a, cN3b, cM0) - Signed by Derek Jack, MD on 05/30/2019   06/07/2019 Cancer Staging   Staging form: Oral Cavity, AJCC 8th Edition - Pathologic stage from 06/07/2019: Stage IVB (pT4a, pN3b, cM0) - Signed by Eppie Gibson, MD on 06/07/2019   06/27/2019 -  Chemotherapy   The patient had palonosetron (ALOXI) injection 0.25 mg, 0.25 mg, Intravenous,  Once, 6 of 7 cycles Administration: 0.25 mg (06/27/2019), 0.25 mg (07/04/2019), 0.25 mg (07/11/2019), 0.25 mg (07/18/2019), 0.25 mg (07/25/2019) CISplatin (PLATINOL) 79 mg in sodium chloride 0.9 % 250 mL chemo infusion, 40 mg/m2 = 79 mg, Intravenous,  Once, 6 of 7 cycles Dose modification: 32 mg/m2 (80 % of original dose 40 mg/m2, Cycle 4, Reason: Other (see comments), Comment: weight loss, low wbc), 32 mg/m2 (original dose 40 mg/m2, Cycle 5, Reason: Treatment Parameters Not Met, Comment: ANC 1.4) Administration: 79 mg (06/27/2019), 79 mg (07/04/2019), 79 mg (07/11/2019), 63 mg (07/18/2019), 63 mg (07/25/2019) fosaprepitant (EMEND) 150 mg, dexamethasone (DECADRON) 12 mg in sodium chloride 0.9 % 145 mL IVPB, , Intravenous,  Once, 6 of 7 cycles Administration:  (06/27/2019),  (07/04/2019),  (07/11/2019),  (07/18/2019),  (07/25/2019)  for chemotherapy treatment.       CANCER STAGING: Cancer Staging Cancer of lateral margin of anterior two-thirds of  tongue ([place]) Staging form: Oral Cavity, AJCC 8th Edition - Clinical: Stage IVB (cT4a, cN3b, cM0) - Signed by Derek Jack, MD on 05/30/2019 - Pathologic stage from 06/07/2019: Stage IVB (pT4a, pN3b, cM0) - Signed by Eppie Gibson, MD on 06/07/2019    INTERVAL HISTORY:  Terri [name] 68 y.o. female returns for routine follow-up for tongue cancer.  Patient reports she still has trouble eating due to her throat being sore.  She also has trouble swallowing.  She is trying to drink Ensure 4 times a day.  She is still losing some weight. Denies any nausea, vomiting, or diarrhea. Denies any new pains. Had not noticed any recent bleeding such as epistaxis, hematuria or hematochezia. Denies recent chest pain on exertion, shortness of breath on minimal exertion, pre-syncopal episodes, or palpitations. Denies any numbness or tingling in hands or feet. Denies any recent fevers, infections, or recent hospitalizations. Patient reports appetite at 25% and energy level at 50%.     REVIEW OF SYSTEMS:  Review of Systems  HENT:   Positive for sore throat and trouble swallowing.   Gastrointestinal: Positive for constipation.  All other systems reviewed and are negative.    PAST MEDICAL/SURGICAL HISTORY:  Past Medical History:  Diagnosis Date  . Hypertension   . Port-A-Cath in place 06/14/2019   Past Surgical History:  Procedure Laterality Date  . 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 Dr. Nicolette Bang at [facility]  . PORTACATH PLACEMENT Left 06/10/2019   Procedure: INSERTION PORT-A-CATH;  Surgeon: Aviva Signs, MD;  Location: [facility];  Service: General;  Laterality: Left;  pt knows to arrive at 6:15  . TUBAL LIGATION       SOCIAL HISTORY:  Social History   Socioeconomic History  . Marital status: Widowed    Spouse name: Not on file  . Number of children: 2  . Years of education: Not on file  . Highest education level:  Not on file  Occupational History  . Not on file  Social Needs  . Financial resource strain: Not hard at all  . Food insecurity    Worry: Never true    Inability: Never true  . Transportation needs    Medical: No    Non-medical: No  Tobacco Use  . Smoking status: Never Smoker  . Smokeless tobacco: Never Used  Substance and Sexual Activity  . Alcohol use: Never    Frequency: Never  . Drug use: Never  . Sexual activity: Not on file  Lifestyle  . Physical activity    Days per week: 0 days    Minutes per session: 0 min  . Stress: Only a little  Relationships  . Social connections    Talks on phone: More than three times a week    Gets together: More than three times a week    Attends religious service: More than 4 times per year    Active member of club or organization: Yes    Attends meetings of clubs or organizations: More than 4 times per year    Relationship status: Widowed  . Intimate partner violence    Fear of current or ex partner: No    Emotionally abused: No    Physically abused: No    Forced sexual activity: No  Other Topics Concern  . Not on file  Social History Narrative      Patient is a widow.   Patient lives with her 2 sons in [place], [place].   Patient is a non-smoker, nondrinker.  Patient has never used chew.  Patient does not use illicit drugs.    FAMILY HISTORY:  Family History  Problem Relation Age of Onset  . Heart failure Mother   . Heart failure Father     CURRENT MEDICATIONS:  Outpatient Encounter Medications as of 08/09/2019  Medication Sig  . amLODipine (NORVASC) 5 MG tablet Take 5 mg by mouth daily.  [name] CISPLATIN IV Inject into the vein once a week.  . dronabinol (MARINOL) 5 MG capsule Take 1 capsule (5 mg total) by mouth 2 (two) times daily before a meal.  . Multiple Vitamins-Minerals (MULTIVITAMIN WITH MINERALS) tablet Take 1 tablet by mouth daily.  [name] omeprazole (PRILOSEC) 40 MG capsule Take 40 mg by mouth daily.   [name]  acetaminophen (TYLENOL) 500 MG tablet Take 500 mg by mouth every 8 (eight) hours as needed (pain).   . chlorhexidine (PERIDEX) 0.12 % solution Use as directed 15 mLs in the mouth or throat 2 (two) times daily. (Patient not taking: Reported on 08/09/2019)  . diclofenac (VOLTAREN) 75 MG EC tablet Take 75 mg by mouth 2 (two) times daily as needed (pain).   [name] HYDROcodone-acetaminophen (NORCO) 5-325 MG tablet Take 1 tablet by mouth every 4 (four) hours as needed for moderate pain. (Patient not taking: Reported on 08/09/2019)  . lidocaine (XYLOCAINE) 2 % solution Patient: Mix 1part 2% viscous lidocaine, 1part H20. Swish & swallow 64m of diluted mixture, 338m before meals and at bedtime, up to QID. May otherwise swish and spit up to 8 times daily. (  Patient not taking: Reported on 08/09/2019)  . lidocaine-prilocaine (EMLA) cream Apply small amount to port a cath site and cover with plastic wrap one hour prior to chemotherapy appointments. (Patient not taking: Reported on 08/09/2019)  . prochlorperazine (COMPAZINE) 10 MG tablet Take 1 tablet (10 mg total) by mouth every 6 (six) hours as needed (Nausea or vomiting). (Patient not taking: Reported on 08/09/2019)   Facility-Administered Encounter Medications as of 08/09/2019  Medication  . heparin lock flush 100 unit/mL  . sodium chloride 0.9 % 1,000 mL with potassium chloride 20 mEq, magnesium sulfate 2 g infusion  . sodium chloride flush (NS) 0.9 % injection 10 mL    ALLERGIES:  No Known Allergies   PHYSICAL EXAM:  ECOG Performance status: 1  Vitals:   08/09/19 0904  BP: 132/74  Pulse: 98  Resp: 18  Temp: (!) 97.3 F (36.3 C)  SpO2: 100%   Filed Weights   08/09/19 0904  Weight: 166 lb 11.2 oz (75.6 kg)    Physical Exam Constitutional:      Appearance: Normal appearance. She is normal weight.  Cardiovascular:     Rate and Rhythm: Normal rate and regular rhythm.     Heart sounds: Normal heart sounds.  Pulmonary:     Effort: Pulmonary effort  is normal.     Breath sounds: Normal breath sounds.  Abdominal:     General: Bowel sounds are normal.     Palpations: Abdomen is soft.  Musculoskeletal: Normal range of motion.  Skin:    General: Skin is warm.  Neurological:     Mental Status: She is alert and oriented to person, place, and time. Mental status is at baseline.  Psychiatric:        Mood and Affect: Mood normal.        Behavior: Behavior normal.        Thought Content: Thought content normal.        Judgment: Judgment normal.      LABORATORY DATA:  I have reviewed the labs as listed.  CBC    Component Value Date/Time   WBC 0.9 (LL) 08/09/2019 0836   RBC 4.21 08/09/2019 0836   HGB 12.4 08/09/2019 0836   HCT 37.3 08/09/2019 0836   PLT 243 08/09/2019 0836   MCV 88.6 08/09/2019 0836   MCH 29.5 08/09/2019 0836   MCHC 33.2 08/09/2019 0836   RDW 14.3 08/09/2019 0836   LYMPHSABS 0.1 (L) 08/09/2019 0836   MONOABS 0.3 08/09/2019 0836   EOSABS 0.0 08/09/2019 0836   BASOSABS 0.0 08/09/2019 0836   CMP Latest Ref Rng & Units 08/09/2019 08/03/2019 08/01/2019  Glucose 70 - 99 mg/dL 82 81 84  BUN 8 - 23 mg/dL _0 Creatinine 0.44 - 1.00 mg/dL 0.90 0.83 0.72  Sodium 135 - 145 mmol/L 132(L) 134(L) 135  Potassium 3.5 - 5.1 mmol/L 4.3 3.6 3.9  Chloride 98 - 111 mmol/L 96(L) 98 99  CO2 22 - 32 mmol/L _1 Calcium 8.9 - 10.3 mg/dL 9.2 9.0 9.0  Total Protein 6.5 - 8.1 g/dL 7.3 6.9 6.7  Total Bilirubin 0.3 - 1.2 mg/dL 0.6 0.5 0.6  Alkaline Phos 38 - 126 U/L 64 58 54  AST 15 - 41 U/L _2 ALT 0 - 44 U/L _3 I personally performed a face-to-face visit.  All questions were answered to patient's stated satisfaction. Encouraged patient to call with any new concerns or questions before his next  visit to the cancer center and we can certain see him sooner, if needed.     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 Nicolette Bang at [facility]. -Pathology showed 4.2 cm tumor, margins negative, 17 mm depth of invasion, moderately to poorly differentiated, positive neutral invasion, 2/17 lymph nodes positive, ECE positive, pT4a, PN 3B. -Will obtain p16 staining results from [facility]. -Because of adverse features including ECE, positive perineural invasion, T4 primary and N3 disease, she was recommended systemic therapy with RT as a category 1 recommendation. -IMRT 60 Gy in 30 fractions started on 06/27/2019. -Cisplatin weekly started on 06/27/2019. -She completed 5 cycles of weekly cisplatin on 07/25/2019.  We held off on any additional cycles due to neutropenia's.  She completed radiation. -She denies any ringing in her ears. -We will see her back in 2 weeks with office visit and labs.  2.  Nutrition: -She reported loss of appetite.  She has lost 3 pounds since her last visit. -She was prescribed Marinol on 07/13/2019.  She is taking it twice a day.  She reports she has not noticed any improvement in her appetite yet. -She spoke with our dietitian.  She is drinking 4 cans of Ensure per day. -She reports her taste and smell are fine its more not wanting food.      Orders placed this encounter:  Orders Placed This Encounter  Procedures  . Lactate dehydrogenase  . Magnesium  . CBC with Differential/Platelet  . Comprehensive metabolic panel      Francene Finders, FNP-C [place] [phone]

## 2019-08-09 NOTE — Assessment & Plan Note (Addendum)
1. 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 Nicolette Bang at [facility]. -Pathology showed 4.2 cm tumor, margins negative, 17 mm depth of invasion, moderately to poorly differentiated, positive neutral invasion, 2/17 lymph nodes positive, ECE positive, pT4a, PN 3B. -Will obtain p16 staining results from [facility]. -Because of adverse features including ECE, positive perineural invasion, T4 primary and N3 disease, she was recommended systemic therapy with RT as a category 1 recommendation. -IMRT 60 Gy in 30 fractions started on 06/27/2019. -Cisplatin weekly started on 06/27/2019. -She completed 5 cycles of weekly cisplatin on 07/25/2019.  We held off on any additional cycles due to neutropenia's.  She completed radiation. -She denies any ringing in her ears. -We will see her back in 2 weeks with office visit and labs.  2.  Nutrition: -She reported loss of appetite.  She has lost 3 pounds since her last visit. -She was prescribed Marinol on 07/13/2019.  She is taking it twice a day.  She reports she has not noticed any improvement in her appetite yet. -She spoke with our dietitian.  She is drinking 4 cans of Ensure per day. -She reports her taste and smell are fine its more not wanting food.

## 2019-08-09 NOTE — Patient Instructions (Signed)
[facility] at [facility] Discharge Instructions  Follow up in 2 weeks with labs    Thank you for choosing [facility] at [facility] to provide your oncology and hematology care.  To afford each patient quality time with our provider, please arrive at least 15 minutes before your scheduled appointment time.   If you have a lab appointment with the Cancer Center please come in thru the Main Entrance and check in at the main information desk.  You need to re-schedule your appointment should you arrive 10 or more minutes late.  We strive to give you quality time with our providers, and arriving late affects you and other patients whose appointments are after yours.  Also, if you no show three or more times for appointments you may be dismissed from the clinic at the providers discretion.     Again, thank you for choosing [facility].  Our hope is that these requests will decrease the amount of time that you wait before being seen by our physicians.       _____________________________________________________________  Should you have questions after your visit to [facility], please contact our office at [phone] between the hours of 8:00 a.m. and 4:30 p.m.  Voicemails left after 4:00 p.m. will not be returned until the following business day.  For prescription refill requests, have your pharmacy contact our office and allow 72 hours.    Due to Covid, you will need to wear a mask upon entering the hospital. If you do not have a mask, a mask will be given to you at the Main Entrance upon arrival. For doctor visits, patients may have 1 support person with them. For treatment visits, patients can not have anyone with them due to social distancing guidelines and our immunocompromised population.      

## 2019-08-18 NOTE — Progress Notes (Signed)
Terri [name] presents for follow up of radiation completed 08/05/19 to her tongue and bilateral neck.   Pain issues, if any: She report pain to her right jaw area. It feels like a toothache. She has completed the pain medicine she has at home, and would like more if possible.  Using a feeding tube?: N/A Weight changes, if any:  Wt Readings from Last 3 Encounters:  08/19/19 159 lb 6 oz (72.3 kg)  08/09/19 166 lb 11.2 oz (75.6 kg)  08/05/19 169 lb 6.4 oz (76.8 kg)   Swallowing issues, if any: She is drinking 2 ensure/boost daily. She is unable to eat anything else by mouth. She tells me she "just doesn't want anything else". She is drinking 1 Liter of water daily.  Smoking or chewing tobacco? No Using fluoride trays daily? She is using daily.  Last ENT visit was on: 05/25/19 Dr. Nicolette Bang Other notable issues, if any:  She continues to have large amounts of thick saliva The skin to her neck has peeling, dryness, and hyperpigmentation present. She is using sonafine twice daily.  08/09/19 Francene Finders NP at [facility]. She will see Dr. Delton Coombes on 08/24/19.  BP 119/82 (BP Location: Left Arm, Patient Position: Sitting)   Pulse (!) 101   Temp 98.5 F (36.9 C) (Temporal)   Resp 18   Ht 5\' 5"  (1.651 m)   Wt 159 lb 6 oz (72.3 kg)   SpO2 100%   BMI 26.52 kg/m

## 2019-08-19 ENCOUNTER — Encounter: Payer: Self-pay | Admitting: Radiation Oncology

## 2019-08-19 ENCOUNTER — Other Ambulatory Visit: Payer: Self-pay

## 2019-08-19 ENCOUNTER — Ambulatory Visit
Admission: RE | Admit: 2019-08-19 | Discharge: 2019-08-19 | Disposition: A | Discharge status: Home / Self Care | Payer: Medicare Other | Source: Ambulatory Visit | Attending: Radiation Oncology | Admitting: Radiation Oncology

## 2019-08-19 DIAGNOSIS — C021 Malignant neoplasm of border of tongue: Secondary | ICD-10-CM | POA: Diagnosis present

## 2019-08-19 DIAGNOSIS — Z79899 Other long term (current) drug therapy: Secondary | ICD-10-CM | POA: Insufficient documentation

## 2019-08-19 NOTE — Progress Notes (Signed)
Radiation Oncology         [phone] ________________________________  Name: Terri [name] MRN: [id number]  Date: 08/19/2019  DOB: 1951-02-15  Follow-Up Visit Note in person  CC: [name], DO  [name], DO  Diagnosis and Prior Radiotherapy:       ICD-10-CM   1. Malignant neoplasm of tip and lateral border of tongue ([place])  C02.1      06/27/2019 through 08/05/2019 Site Technique Total Dose (Gy) Dose per Fx (Gy) Completed Fx Beam Energies  Head & neck: HN_tongue IMRT 60/60 2 30/30 6X    CHIEF COMPLAINT:  Here for follow-up and surveillance of tongue cancer  Narrative:  The patient returns today for routine follow-up.   Pain issues, if any: She report pain to her right jaw area. It feels like a toothache. She has completed the pain medicine she has at home, and would like more if possible.   Using a feeding tube?: N/A Weight changes, if any:  Wt Readings from Last 3 Encounters:  08/19/19 159 lb 6 oz (72.3 kg)  08/09/19 166 lb 11.2 oz (75.6 kg)  08/05/19 169 lb 6.4 oz (76.8 kg)   Swallowing issues, if any: She is drinking 2 ensure/boost daily. She is unable to eat anything else by mouth. She tells me she "just doesn't want anything else". She is drinking 1 Liter of water daily.   Smoking or chewing tobacco? No Using fluoride trays daily? She is using daily.  Last ENT visit was on: 05/25/19 Dr. Nicolette Bang  Other notable issues, if any:  She continues to have large amounts of thick saliva The skin to her neck has peeling, dryness, and hyperpigmentation present. She is using sonafine twice daily.  08/09/19 Francene Finders NP at [facility]. She will see Dr. Delton Coombes on 08/24/19.   ALLERGIES:  has No Known Allergies.  Meds: Current Outpatient Medications  Medication Sig Dispense Refill  . acetaminophen (TYLENOL) 500 MG tablet Take 500 mg by mouth every 8 (eight) hours as needed (pain).     [name] amLODipine (NORVASC) 5 MG tablet Take 5 mg by  mouth daily.    [name] dronabinol (MARINOL) 5 MG capsule Take 1 capsule (5 mg total) by mouth 2 (two) times daily before a meal. 30 capsule 0  . lidocaine (XYLOCAINE) 2 % solution Patient: Mix 1part 2% viscous lidocaine, 1part H20. Swish & swallow 79mL of diluted mixture, 64min before meals and at bedtime, up to QID. May otherwise swish and spit up to 8 times daily. 100 mL 5  . lidocaine-prilocaine (EMLA) cream Apply small amount to port a cath site and cover with plastic wrap one hour prior to chemotherapy appointments. 30 g 3  . Multiple Vitamins-Minerals (MULTIVITAMIN WITH MINERALS) tablet Take 1 tablet by mouth daily.    [name] omeprazole (PRILOSEC) 40 MG capsule Take 40 mg by mouth daily.     . prochlorperazine (COMPAZINE) 10 MG tablet Take 1 tablet (10 mg total) by mouth every 6 (six) hours as needed (Nausea or vomiting). 30 tablet 1  . chlorhexidine (PERIDEX) 0.12 % solution Use as directed 15 mLs in the mouth or throat 2 (two) times daily. (Patient not taking: Reported on 08/09/2019) 120 mL 2  . CISPLATIN IV Inject into the vein once a week.    . diclofenac (VOLTAREN) 75 MG EC tablet Take 75 mg by mouth 2 (two) times daily as needed (pain).     [name] HYDROcodone-acetaminophen (NORCO) 5-325 MG tablet Take 1 tablet  by mouth every 4 (four) hours as needed for moderate pain. (Patient not taking: Reported on 08/09/2019) 20 tablet 0   No current facility-administered medications for this encounter.    Facility-Administered Medications Ordered in Other Encounters  Medication Dose Route Frequency Provider Last Rate Last Dose  . heparin lock flush 100 unit/mL  500 Units Intracatheter Once PRN Derek Jack, MD      . sodium chloride 0.9 % 1,000 mL with potassium chloride 20 mEq, magnesium sulfate 2 g infusion   Intravenous Once Derek Jack, MD      . sodium chloride flush (NS) 0.9 % injection 10 mL  10 mL Intracatheter Once PRN Derek Jack, MD        Physical Findings: The patient is in  no acute distress. Patient is alert and oriented. Wt Readings from Last 3 Encounters:  08/19/19 159 lb 6 oz (72.3 kg)  08/09/19 166 lb 11.2 oz (75.6 kg)  08/05/19 169 lb 6.4 oz (76.8 kg)    height is 5\' 5"  (1.651 m) and weight is 159 lb 6 oz (72.3 kg). Her temporal temperature is 98.5 F (36.9 C). Her blood pressure is 119/82 and her pulse is 101 (abnormal). Her respiration is 18 and oxygen saturation is 100%. .  General: Alert and oriented, in no acute distress HEENT: Head is normocephalic. Extraocular movements are intact. Oropharynx is notable for no visible tumor, no thrush, healing mucosa Neck: Neck is notable for excellent healing of skin  Skin: Skin in treatment fields shows satisfactory healing  Psychiatric: Judgment and insight are intact. Affect is appropriate.   Lab Findings: Lab Results  Component Value Date   WBC 0.9 (LL) 08/09/2019   HGB 12.4 08/09/2019   HCT 37.3 08/09/2019   MCV 88.6 08/09/2019   PLT 243 08/09/2019    Lab Results  Component Value Date   TSH 2.535 06/07/2019    Radiographic Findings: No results found.  Impression/Plan:    1) Head and Neck Cancer Status: Recovering from adjuvant chemoradiation  2) Nutritional Status: Losing weight, I reiterated ways for her to maximize her nutrition.  Continue following with nutritional department PEG tube: None  3) Risk Factors: The patient has been educated about risk factors including alcohol and tobacco abuse; they understand that avoidance of alcohol and tobacco is important to prevent recurrences as well as other cancers  4) Swallowing: Functional  5) Dental: Encouraged to continue regular followup with dentistry, and dental hygiene including fluoride rinses.  Discussed ways to continue to deal with thick secretions and taste changes.  6) Thyroid function: Follow annually Lab Results  Component Value Date   TSH 2.535 06/07/2019    7) Other: pain in jaw - could be inflammatory, but as a  precaution, ENT followup will be arranged by Gayleen Orem, RN, our Head and Neck Oncology Navigator in the next couple of weeks. She will monitor this until then.  Advised OTC pain meds; if severe, discuss Rx's next week with med onc.  8) Follow-up in 2 months with restaging CT imaging of neck and chest. The patient was encouraged to call with any issues or questions before then.  I spent 10 minutes minutes face to face with the patient and more than 50% of that time was spent in counseling and/or coordination of care. _____________________________________   Eppie Gibson, MD  This document serves as a record of services personally performed by Eppie Gibson, MD. It was created on her behalf by Wilburn Mylar, a trained medical scribe.  The creation of this record is based on the scribe's personal observations and the provider's statements to them. This document has been checked and approved by the attending provider.

## 2019-08-19 NOTE — Progress Notes (Signed)
  Patient Name: Terri [name] MRN: [id number] DOB: January 24, 1951 Referring Physician: [name] Date of Service: 08/05/2019 [facility], [place]                                                        End Of Treatment Note  Diagnoses: C02.3-Malignant neoplasm of anterior two-thirds of tongue, part unspecified  Cancer Staging Cancer of lateral margin of anterior two-thirds of tongue ([place]) Staging form: Oral Cavity, AJCC 8th Edition - Clinical: Stage IVB (cT4a, cN3b, cM0) - Signed by Derek Jack, MD on 05/30/2019 - Pathologic stage from 06/07/2019: Stage IVB (pT4a, pN3b, cM0) - Signed by Eppie Gibson, MD on 06/07/2019  Intent: Curative  Radiation Treatment Dates: 06/27/2019 through 08/05/2019  Site Technique Total Dose (Gy) Dose per Fx (Gy) Completed Fx Beam Energies  Head & neck: HN_tongue IMRT 60/60 2 30/30 6X   Narrative: The patient tolerated radiation therapy relatively well. She was undergoing concurrent chemotherapy. She reported pain to the inside of her jaw, moderate fatigue, thick and brown saliva, and some nausea. She experienced patchy mucositis and no thrush. She denied any difficulty swallowing and dry mouth.  Plan: The patient will follow-up with radiation oncology in 2 weeks.  ________________________________________________   Eppie Gibson, MD  This document serves as a record of services personally performed by Eppie Gibson, MD. It was created on her behalf by Wilburn Mylar, a trained medical scribe. The creation of this record is based on the scribe's personal observations and the provider's statements to them. This document has been checked and approved by the attending provider.

## 2019-08-24 ENCOUNTER — Other Ambulatory Visit: Payer: Self-pay

## 2019-08-24 ENCOUNTER — Other Ambulatory Visit: Payer: Self-pay | Admitting: Radiation Oncology

## 2019-08-24 ENCOUNTER — Encounter: Payer: Self-pay | Admitting: Radiation Oncology

## 2019-08-24 ENCOUNTER — Inpatient Hospital Stay (HOSPITAL_BASED_OUTPATIENT_CLINIC_OR_DEPARTMENT_OTHER): Payer: BC Managed Care – PPO | Admitting: Hematology

## 2019-08-24 ENCOUNTER — Encounter (HOSPITAL_COMMUNITY): Payer: Self-pay | Admitting: Hematology

## 2019-08-24 ENCOUNTER — Inpatient Hospital Stay (HOSPITAL_COMMUNITY): Payer: BC Managed Care – PPO

## 2019-08-24 DIAGNOSIS — C023 Malignant neoplasm of anterior two-thirds of tongue, part unspecified: Secondary | ICD-10-CM

## 2019-08-24 DIAGNOSIS — C021 Malignant neoplasm of border of tongue: Secondary | ICD-10-CM

## 2019-08-24 LAB — COMPREHENSIVE METABOLIC PANEL
ALT: 24 U/L (ref 0–44)
AST: 23 U/L (ref 15–41)
Albumin: 3.9 g/dL (ref 3.5–5.0)
Alkaline Phosphatase: 53 U/L (ref 38–126)
Anion gap: 13 (ref 5–15)
BUN: 21 mg/dL (ref 8–23)
CO2: 30 mmol/L (ref 22–32)
Calcium: 10 mg/dL (ref 8.9–10.3)
Chloride: 92 mmol/L — ABNORMAL LOW (ref 98–111)
Creatinine, Ser: 1.29 mg/dL — ABNORMAL HIGH (ref 0.44–1.00)
GFR calc Af Amer: 49 mL/min — ABNORMAL LOW (ref >60)
GFR calc non Af Amer: 43 mL/min — ABNORMAL LOW (ref >60)
Glucose, Bld: 96 mg/dL (ref 70–99)
Potassium: 3.6 mmol/L (ref 3.5–5.1)
Sodium: 135 mmol/L (ref 135–145)
Total Bilirubin: 0.6 mg/dL (ref 0.3–1.2)
Total Protein: 7.4 g/dL (ref 6.5–8.1)

## 2019-08-24 LAB — CBC WITH DIFFERENTIAL/PLATELET
Abs Immature Granulocytes: 0.02 10*3/uL (ref 0.00–0.07)
Basophils Absolute: 0 10*3/uL (ref 0.0–0.1)
Basophils Relative: 1 %
Eosinophils Absolute: 0 10*3/uL (ref 0.0–0.5)
Eosinophils Relative: 1 %
HCT: 36.8 % (ref 36.0–46.0)
Hemoglobin: 12.3 g/dL (ref 12.0–15.0)
Immature Granulocytes: 1 %
Lymphocytes Relative: 9 %
Lymphs Abs: 0.4 10*3/uL — ABNORMAL LOW (ref 0.7–4.0)
MCH: 30.1 pg (ref 26.0–34.0)
MCHC: 33.4 g/dL (ref 30.0–36.0)
MCV: 90 fL (ref 80.0–100.0)
Monocytes Absolute: 0.6 10*3/uL (ref 0.1–1.0)
Monocytes Relative: 15 %
Neutro Abs: 3 10*3/uL (ref 1.7–7.7)
Neutrophils Relative %: 73 %
Platelets: 242 10*3/uL (ref 150–400)
RBC: 4.09 MIL/uL (ref 3.87–5.11)
RDW: 16 % — ABNORMAL HIGH (ref 11.5–15.5)
WBC: 4.1 10*3/uL (ref 4.0–10.5)
nRBC: 0 % (ref 0.0–0.2)

## 2019-08-24 LAB — MAGNESIUM: Magnesium: 2.2 mg/dL (ref 1.7–2.4)

## 2019-08-24 LAB — LACTATE DEHYDROGENASE: LDH: 134 U/L (ref 98–192)

## 2019-08-24 NOTE — Patient Instructions (Addendum)
[place] at [facility] Discharge Instructions  You were seen today by Dr. Delton Coombes. He went over your recent lab results. He would like you to make sure that you are drinking 2-3 liters of water a day. He will see you back in 6 weeks for labs and follow up.   Thank you for choosing [place] at [facility] to provide your oncology and hematology care.  To afford each patient quality time with our provider, please arrive at least 15 minutes before your scheduled appointment time.   If you have a lab appointment with the [place] please come in thru the  Main Entrance and check in at the main information desk  You need to re-schedule your appointment should you arrive 10 or more minutes late.  We strive to give you quality time with our providers, and arriving late affects you and other patients whose appointments are after yours.  Also, if you no show three or more times for appointments you may be dismissed from the clinic at the providers discretion.     Again, thank you for choosing [facility].  Our hope is that these requests will decrease the amount of time that you wait before being seen by our physicians.       _____________________________________________________________  Should you have questions after your visit to [facility], please contact our office at [phone] between the hours of 8:00 a.m. and 4:30 p.m.  Voicemails left after 4:00 p.m. will not be returned until the following business day.  For prescription refill requests, have your pharmacy contact our office and allow 72 hours.    Cancer Center Support Programs:   > Cancer Support Group  2nd Tuesday of the month 1pm-2pm, Journey Room

## 2019-08-24 NOTE — Assessment & Plan Note (Signed)
1. 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 Nicolette Bang at [facility]. -Pathology showed 4.2 cm tumor, margins negative, 17 mm depth of invasion, moderately to poorly differentiated, positive neutral invasion, 2/17 lymph nodes positive, ECE positive, pT4a, PN 3B. -Will obtain p16 staining results from [facility]. -Because of adverse features including ECE, positive perineural invasion, T4 primary and N3 disease, she was recommended systemic therapy with RT as a category 1 recommendation. -IMRT 60 Gy in 30 fractions started on 06/27/2019. -Weekly cisplatin from 06/27/2019 through 07/25/2019. -She denies any peripheral neuropathy or ringing in the ears.  Physical examination today did not reveal any palpable masses or visible masses.  No lymphadenopathy. -We will consider ordering scans at next visit.  2.  Nutrition: -She is drinking 3 cans of Ensure.  Does not have any appetite.  She has used Marinol in the past without help. -She reports she can taste foods.  She does report thick sputum. -I have recommended her to drink Ensure clear with target calories of 1800-2000. -She lost about 8 pounds in the last 2 weeks.  I will reevaluate her in 4 to 5 weeks.  She will also continue talking to [place], our nutritionist.

## 2019-08-24 NOTE — Progress Notes (Signed)
Terri [name], [place] [address]   CLINIC:  Medical Oncology/Hematology  PCP:  [name], DO [address] [name] [address] [phone]   REASON FOR VISIT:  Follow-up for right tongue cancer.     INTERVAL HISTORY:  Terri [name] 68 y.o. 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  She has completed chemotherapy on 07/25/2019.  However she is continuing to lose weight.  She lost 8 pounds in the last 2 weeks.  She reports that she does not have any appetite.  She has used Marinol in the past without help.  She is drinking about 3 cans of Ensure at this time.  Not able to eat any solid foods.  Appetite is 25%.  Energy levels are 50%.  Reports constipation.  Denies any tingling or numbness next 20s.  Denies any ringing in the ears.  REVIEW OF SYSTEMS:  Review of Systems  Gastrointestinal: Positive for constipation.  All other systems reviewed and are negative.    PAST MEDICAL/SURGICAL HISTORY:  Past Medical History:  Diagnosis Date  . Hypertension   . Port-A-Cath in place 06/14/2019   Past Surgical History:  Procedure Laterality Date  . 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 Dr. Nicolette Bang at [facility]  . PORTACATH PLACEMENT Left 06/10/2019   Procedure: INSERTION PORT-A-CATH;  Surgeon: Aviva Signs, MD;  Location: [facility];  Service: General;  Laterality: Left;  pt knows to arrive at 6:15  . TUBAL LIGATION       SOCIAL HISTORY:  Social History   Socioeconomic History  . Marital status: Widowed    Spouse name: Not on file  . Number of children: 2  . Years of education: Not on file  . Highest education level: Not on file  Occupational History  . Not on file  Social Needs  . Financial resource strain: Not hard at all  . Food insecurity    Worry: Never true    Inability: Never true  .  Transportation needs    Medical: No    Non-medical: No  Tobacco Use  . Smoking status: Never Smoker  . Smokeless tobacco: Never Used  Substance and Sexual Activity  . Alcohol use: Never    Frequency: Never  . Drug use: Never  . Sexual activity: Not on file  Lifestyle  . Physical activity    Days per week: 0 days    Minutes per session: 0 min  . Stress: Only a little  Relationships  . Social connections    Talks on phone: More than three times a week    Gets together: More than three times a week    Attends religious service: More than 4 times per year    Active member of club or organization: Yes    Attends meetings of clubs or organizations: More than 4 times per year    Relationship status: Widowed  . Intimate partner violence    Fear of current or ex partner: No    Emotionally abused: No    Physically abused: No    Forced sexual activity: No  Other Topics Concern  . Not on file  Social History Narrative      Patient is a widow.   Patient lives with her 2 sons in [place], [place].   Patient is a non-smoker, nondrinker.  Patient has never used  chew.  Patient does not use illicit drugs.    FAMILY HISTORY:  Family History  Problem Relation Age of Onset  . Heart failure Mother   . Heart failure Father     CURRENT MEDICATIONS:  Outpatient Encounter Medications as of 08/24/2019  Medication Sig  . amLODipine (NORVASC) 5 MG tablet Take 5 mg by mouth daily.  [name] CISPLATIN IV Inject into the vein once a week.  . dronabinol (MARINOL) 5 MG capsule Take 1 capsule (5 mg total) by mouth 2 (two) times daily before a meal.  . Multiple Vitamins-Minerals (MULTIVITAMIN WITH MINERALS) tablet Take 1 tablet by mouth daily.  [name] omeprazole (PRILOSEC) 40 MG capsule Take 40 mg by mouth daily.   [name] acetaminophen (TYLENOL) 500 MG tablet Take 500 mg by mouth every 8 (eight) hours as needed (pain).   . chlorhexidine (PERIDEX) 0.12 % solution Use as directed 15 mLs in the mouth or throat 2  (two) times daily. (Patient not taking: Reported on 08/09/2019)  . diclofenac (VOLTAREN) 75 MG EC tablet Take 75 mg by mouth 2 (two) times daily as needed (pain).   [name] HYDROcodone-acetaminophen (NORCO) 5-325 MG tablet Take 1 tablet by mouth every 4 (four) hours as needed for moderate pain. (Patient not taking: Reported on 08/09/2019)  . lidocaine (XYLOCAINE) 2 % solution Patient: Mix 1part 2% viscous lidocaine, 1part H20. Swish & swallow 69mL of diluted mixture, 13min before meals and at bedtime, up to QID. May otherwise swish and spit up to 8 times daily. (Patient not taking: Reported on 08/24/2019)  . lidocaine-prilocaine (EMLA) cream Apply small amount to port a cath site and cover with plastic wrap one hour prior to chemotherapy appointments. (Patient not taking: Reported on 08/24/2019)  . prochlorperazine (COMPAZINE) 10 MG tablet Take 1 tablet (10 mg total) by mouth every 6 (six) hours as needed (Nausea or vomiting). (Patient not taking: Reported on 08/24/2019)   Facility-Administered Encounter Medications as of 08/24/2019  Medication  . heparin lock flush 100 unit/mL  . sodium chloride 0.9 % 1,000 mL with potassium chloride 20 mEq, magnesium sulfate 2 g infusion  . sodium chloride flush (NS) 0.9 % injection 10 mL    ALLERGIES:  No Known Allergies   PHYSICAL EXAM:  ECOG Performance status: 0  Vitals:   08/24/19 1441  BP: 139/76  Pulse: 88  Resp: 16  Temp: (!) 96.9 F (36.1 C)  SpO2: 100%   Filed Weights   08/24/19 1441  Weight: 158 lb 1.6 oz (71.7 kg)    Physical Exam Vitals signs reviewed.  Constitutional:      Appearance: Normal appearance.  HENT:     Mouth/Throat:     Comments: Right lateral tongue is well-healed.   Cardiovascular:     Rate and Rhythm: Normal rate and regular rhythm.     Heart sounds: Normal heart sounds.  Pulmonary:     Effort: Pulmonary effort is normal.     Breath sounds: Normal breath sounds.  Abdominal:     General: There is no distension.      Palpations: Abdomen is soft. There is no mass.  Musculoskeletal:        General: No swelling.  Lymphadenopathy:     Cervical: No cervical adenopathy.  Skin:    General: Skin is warm.  Neurological:     General: No focal deficit present.     Mental Status: She is alert and oriented to person, place, and time.  Psychiatric:  Mood and Affect: Mood normal.        Behavior: Behavior normal.   No oropharyngeal lesions.  No neck adenopathy.   LABORATORY DATA:  I have reviewed the labs as listed.  CBC    Component Value Date/Time   WBC 4.1 08/24/2019 1339   RBC 4.09 08/24/2019 1339   HGB 12.3 08/24/2019 1339   HCT 36.8 08/24/2019 1339   PLT 242 08/24/2019 1339   MCV 90.0 08/24/2019 1339   MCH 30.1 08/24/2019 1339   MCHC 33.4 08/24/2019 1339   RDW 16.0 (H) 08/24/2019 1339   LYMPHSABS 0.4 (L) 08/24/2019 1339   MONOABS 0.6 08/24/2019 1339   EOSABS 0.0 08/24/2019 1339   BASOSABS 0.0 08/24/2019 1339   CMP Latest Ref Rng & Units 08/24/2019 08/09/2019 08/03/2019  Glucose 70 - 99 mg/dL 96 82 81  BUN 8 - 23 mg/dL 21 22 13   Creatinine 0.44 - 1.00 mg/dL 1.29(H) 0.90 0.83  Sodium 135 - 145 mmol/L 135 132(L) 134(L)  Potassium 3.5 - 5.1 mmol/L 3.6 4.3 3.6  Chloride 98 - 111 mmol/L 92(L) 96(L) 98  CO2 22 - 32 mmol/L 30 23 25   Calcium 8.9 - 10.3 mg/dL 10.0 9.2 9.0  Total Protein 6.5 - 8.1 g/dL 7.4 7.3 6.9  Total Bilirubin 0.3 - 1.2 mg/dL 0.6 0.6 0.5  Alkaline Phos 38 - 126 U/L 53 64 58  AST 15 - 41 U/L 23 25 18   ALT 0 - 44 U/L 24 28 26        DIAGNOSTIC IMAGING:  I have independently reviewed the scans and discussed with the patient.   I have reviewed Terri Lick LPN's note and agree with the documentation.  I personally performed a face-to-face visit, made revisions and my assessment and plan is as follows.    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 Stage IVb (T4AN3B) moderately to poorly differentiated squamous cell  carcinoma of the right lateral tongue: -Right partial glossectomy and neck dissection on 05/13/2019 by Dr. Nicolette Bang at [facility]. -Pathology showed 4.2 cm tumor, margins negative, 17 mm depth of invasion, moderately to poorly differentiated, positive neutral invasion, 2/17 lymph nodes positive, ECE positive, pT4a, PN 3B. -Will obtain p16 staining results from [facility]. -Because of adverse features including ECE, positive perineural invasion, T4 primary and N3 disease, she was recommended systemic therapy with RT as a category 1 recommendation. -IMRT 60 Gy in 30 fractions started on 06/27/2019. -Weekly cisplatin from 06/27/2019 through 07/25/2019. -She denies any peripheral neuropathy or ringing in the ears.  Physical examination today did not reveal any palpable masses or visible masses.  No lymphadenopathy. -We will consider ordering scans at next visit.  2.  Nutrition: -She is drinking 3 cans of Ensure.  Does not have any appetite.  She has used Marinol in the past without help. -She reports she can taste foods.  She does report thick sputum. -I have recommended her to drink Ensure clear with target calories of 1800-2000. -She lost about 8 pounds in the last 2 weeks.  I will reevaluate her in 4 to 5 weeks.  She will also continue talking to [facility], our nutritionist.      Orders placed this encounter:  No orders of the defined types were placed in this encounter.     Derek Jack, MD [place] [phone]

## 2019-08-26 ENCOUNTER — Telehealth: Payer: Self-pay | Admitting: *Deleted

## 2019-08-26 ENCOUNTER — Ambulatory Visit (HOSPITAL_COMMUNITY): Payer: BC Managed Care – PPO

## 2019-08-26 NOTE — Progress Notes (Signed)
Nutrition  Called patient for nutrition follow-up. No answer.  Left message and provided call back number.  Elveria Lauderbaugh B. Zenia Resides, [place], [place] Registered Dietitian [phone] (pager)

## 2019-08-26 NOTE — Telephone Encounter (Signed)
Oncology Nurse Navigator Documentation  Per patient's 11/13 post-treatment follow-up with Dr. Isidore Moos, sent e-mail to Johna Sheriff, scheduler for Dr. Nicolette Bang.  Requested follow-up appointment in 2-4 weeks, noted Ms. Passarella reported jaw pain during follow-up appt.  Gayleen Orem, RN, BSN Head & Neck Oncology Nurse [place] at [place] [phone]

## 2019-08-31 ENCOUNTER — Telehealth: Payer: Self-pay | Admitting: *Deleted

## 2019-08-31 NOTE — Telephone Encounter (Signed)
Oncology Nurse Navigator Documentation  Rec'd email from Dr. Servando Salina scheduler, Jocelyn Lamer, indicating she LVMM for Ms. Loken informing her of 09/13/2019 appt.  Gayleen Orem, RN, BSN Head & Neck Oncology Nurse [place] at [place] [phone]

## 2019-09-09 ENCOUNTER — Telehealth (HOSPITAL_COMMUNITY): Payer: Self-pay

## 2019-09-09 NOTE — Telephone Encounter (Signed)
Nutrition  Called patient for nutrition follow-up.  No answer.  Left message with call back number.  Narada Uzzle B. Zenia Resides, [place], [place] Registered Dietitian [phone] (pager)

## 2019-09-27 ENCOUNTER — Other Ambulatory Visit: Payer: Self-pay

## 2019-09-28 ENCOUNTER — Inpatient Hospital Stay (HOSPITAL_COMMUNITY): Payer: BC Managed Care – PPO

## 2019-09-28 ENCOUNTER — Inpatient Hospital Stay (HOSPITAL_COMMUNITY): Payer: BC Managed Care – PPO | Attending: Hematology | Admitting: Hematology

## 2019-09-28 ENCOUNTER — Encounter (HOSPITAL_COMMUNITY): Payer: Self-pay | Admitting: Hematology

## 2019-09-28 VITALS — BP 118/74 | HR 101 | Temp 97.3°F | Resp 18 | Wt 156.7 lb

## 2019-09-28 DIAGNOSIS — Z85810 Personal history of malignant neoplasm of tongue: Secondary | ICD-10-CM | POA: Diagnosis present

## 2019-09-28 DIAGNOSIS — C023 Malignant neoplasm of anterior two-thirds of tongue, part unspecified: Secondary | ICD-10-CM | POA: Diagnosis not present

## 2019-09-28 DIAGNOSIS — R42 Dizziness and giddiness: Secondary | ICD-10-CM | POA: Diagnosis not present

## 2019-09-28 DIAGNOSIS — Z79899 Other long term (current) drug therapy: Secondary | ICD-10-CM | POA: Insufficient documentation

## 2019-09-28 DIAGNOSIS — Z9221 Personal history of antineoplastic chemotherapy: Secondary | ICD-10-CM | POA: Diagnosis not present

## 2019-09-28 DIAGNOSIS — K5900 Constipation, unspecified: Secondary | ICD-10-CM | POA: Diagnosis not present

## 2019-09-28 DIAGNOSIS — Z923 Personal history of irradiation: Secondary | ICD-10-CM | POA: Insufficient documentation

## 2019-09-28 DIAGNOSIS — C021 Malignant neoplasm of border of tongue: Secondary | ICD-10-CM

## 2019-09-28 LAB — CBC WITH DIFFERENTIAL/PLATELET
Abs Immature Granulocytes: 0 10*3/uL (ref 0.00–0.07)
Basophils Absolute: 0 10*3/uL (ref 0.0–0.1)
Basophils Relative: 1 %
Eosinophils Absolute: 0.1 10*3/uL (ref 0.0–0.5)
Eosinophils Relative: 5 %
HCT: 38.8 % (ref 36.0–46.0)
Hemoglobin: 12.5 g/dL (ref 12.0–15.0)
Immature Granulocytes: 0 %
Lymphocytes Relative: 12 %
Lymphs Abs: 0.3 10*3/uL — ABNORMAL LOW (ref 0.7–4.0)
MCH: 30.4 pg (ref 26.0–34.0)
MCHC: 32.2 g/dL (ref 30.0–36.0)
MCV: 94.4 fL (ref 80.0–100.0)
Monocytes Absolute: 0.4 10*3/uL (ref 0.1–1.0)
Monocytes Relative: 14 %
Neutro Abs: 1.8 10*3/uL (ref 1.7–7.7)
Neutrophils Relative %: 68 %
Platelets: 154 10*3/uL (ref 150–400)
RBC: 4.11 MIL/uL (ref 3.87–5.11)
RDW: 15.7 % — ABNORMAL HIGH (ref 11.5–15.5)
WBC: 2.6 10*3/uL — ABNORMAL LOW (ref 4.0–10.5)
nRBC: 0 % (ref 0.0–0.2)

## 2019-09-28 LAB — COMPREHENSIVE METABOLIC PANEL
ALT: 19 U/L (ref 0–44)
AST: 22 U/L (ref 15–41)
Albumin: 4 g/dL (ref 3.5–5.0)
Alkaline Phosphatase: 48 U/L (ref 38–126)
Anion gap: 9 (ref 5–15)
BUN: 17 mg/dL (ref 8–23)
CO2: 28 mmol/L (ref 22–32)
Calcium: 9.5 mg/dL (ref 8.9–10.3)
Chloride: 97 mmol/L — ABNORMAL LOW (ref 98–111)
Creatinine, Ser: 1.14 mg/dL — ABNORMAL HIGH (ref 0.44–1.00)
GFR calc Af Amer: 57 mL/min — ABNORMAL LOW (ref >60)
GFR calc non Af Amer: 49 mL/min — ABNORMAL LOW (ref >60)
Glucose, Bld: 105 mg/dL — ABNORMAL HIGH (ref 70–99)
Potassium: 3.9 mmol/L (ref 3.5–5.1)
Sodium: 134 mmol/L — ABNORMAL LOW (ref 135–145)
Total Bilirubin: 0.6 mg/dL (ref 0.3–1.2)
Total Protein: 7.3 g/dL (ref 6.5–8.1)

## 2019-09-28 LAB — MAGNESIUM: Magnesium: 2 mg/dL (ref 1.7–2.4)

## 2019-09-28 NOTE — Patient Instructions (Addendum)
[place] at [facility] Discharge Instructions  You were seen today by Dr. Delton Coombes. He went over your recent lab results. Try getting in at least 2,000 calories a day. Increase the number of Boost you drink during the day. He will see you back in 1 month for labs, repeat PET scan and follow up.   Thank you for choosing [place] at [facility] to provide your oncology and hematology care.  To afford each patient quality time with our provider, please arrive at least 15 minutes before your scheduled appointment time.   If you have a lab appointment with the [place] please come in thru the  Main Entrance and check in at the main information desk  You need to re-schedule your appointment should you arrive 10 or more minutes late.  We strive to give you quality time with our providers, and arriving late affects you and other patients whose appointments are after yours.  Also, if you no show three or more times for appointments you may be dismissed from the clinic at the providers discretion.     Again, thank you for choosing [facility].  Our hope is that these requests will decrease the amount of time that you wait before being seen by our physicians.       _____________________________________________________________  Should you have questions after your visit to [facility], please contact our office at [phone] between the hours of 8:00 a.m. and 4:30 p.m.  Voicemails left after 4:00 p.m. will not be returned until the following business day.  For prescription refill requests, have your pharmacy contact our office and allow 72 hours.    Cancer Center Support Programs:   > Cancer Support Group  2nd Tuesday of the month 1pm-2pm, Journey Room

## 2019-09-28 NOTE — Assessment & Plan Note (Addendum)
1. 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 Nicolette Bang at [facility]. -Pathology showed 4.2 cm tumor, margins negative, 17 mm depth of invasion, moderately to poorly differentiated, positive neutral invasion, 2/17 lymph nodes positive, ECE positive, pT4a, PN 3B. -Will obtain p16 staining results from [facility]. -Because of adverse features including ECE, positive perineural invasion, T4 primary and N3 disease, she was recommended systemic therapy with RT as a category 1 recommendation. -IMRT 60 Gy in 30 fractions started on 06/27/2019. -Weekly cisplatin from 06/27/2019 through 07/25/2019. -She was seen by Dr. Nicolette Bang at [facility] on 09/27/2019.  Motor examination was normal. -Physical exam today did not show any visible masses in the tongue or oropharynx.  No palpable neck adenopathy was seen. -We reviewed her labs.  White count is slightly low at 2.6 with normal ANC. -I have recommended a PET CT scan on follow-up in 3 to 4 weeks.  We will also check a TSH level. -She wonders when she can go back to work.  At this time she feels tired by the afternoon and cannot do part-time work for her job.  We will reevaluate her in 1 month.  2.  Nutrition: -She is drinking 3 cans of Ensure.  She has used Marinol in the past without help. -She lost about 2 pounds since last visit 4 weeks ago.  Thick secretions in the throat are improving.  She reports that she can taste foods and denies any difficulty swallowing.  She feels like she has to put extra effort to eat. -I have encouraged her to start eating more soft foods.  We have also told her to change Ensure to Ensure Plus that she can get more calories. -She is drinking fiber for constipation which does not always help.  She will also follow-up with our nutritionist.  3.  Dizziness: -She reported occasional dizziness when  she is sitting or standing. -Blood pressure today systolic 914/78.  I reviewed her medications.  Amlodipine 5 mg will be discontinued. -I will reevaluate her in 4 weeks.

## 2019-09-28 NOTE — Progress Notes (Signed)
[place], [place] [address]   CLINIC:  Medical Oncology/Hematology  PCP:  [name], DO [address] [name] [address] [phone]   REASON FOR VISIT:  Follow-up for right tongue cancer.    INTERVAL HISTORY:  Terri [name] 68 y.o. 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  She completed chemotherapy on 07/25/2019.  She reports that she can taste foods.  Denies any nausea or vomiting.  Reports constipation but has not been eating much of solid foods.  Reports some dizziness which happens even when she is sitting or standing.  Activities 25% and energy levels are 50%.  Denies any tingling or numbness in extremities.  Denies any ringing in the ears.  She reports no pain.  Thick secretions have also improved.  She reports dryness of the mouth.  REVIEW OF SYSTEMS:  Review of Systems  Gastrointestinal: Positive for constipation.  Neurological: Positive for dizziness.  All other systems reviewed and are negative.    PAST MEDICAL/SURGICAL HISTORY:  Past Medical History:  Diagnosis Date  . Hypertension   . Port-A-Cath in place 06/14/2019   Past Surgical History:  Procedure Laterality Date  . 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 Dr. Nicolette Bang at [facility]  . PORTACATH PLACEMENT Left 06/10/2019   Procedure: INSERTION PORT-A-CATH;  Surgeon: Aviva Signs, MD;  Location: [facility];  Service: General;  Laterality: Left;  pt knows to arrive at 6:15  . TUBAL LIGATION       SOCIAL HISTORY:  Social History   Socioeconomic History  . Marital status: Widowed    Spouse name: Not on file  . Number of children: 2  . Years of education: Not on file  . Highest education level: Not on file  Occupational History  . Not on file  Tobacco Use  . Smoking status: Never Smoker  . Smokeless tobacco: Never Used  Substance  and Sexual Activity  . Alcohol use: Never  . Drug use: Never  . Sexual activity: Not on file  Other Topics Concern  . Not on file  Social History Narrative      Patient is a widow.   Patient lives with her 2 sons in [place], [place].   Patient is a non-smoker, nondrinker.  Patient has never used chew.  Patient does not use illicit drugs.   Social Determinants of Health   Financial Resource Strain: Low Risk   . Difficulty of Paying Living Expenses: Not hard at all  Food Insecurity: No Food Insecurity  . Worried About Charity fundraiser in the Last Year: Never true  . Ran Out of Food in the Last Year: Never true  Transportation Needs: No Transportation Needs  . Lack of Transportation (Medical): No  . Lack of Transportation (Non-Medical): No  Physical Activity: Inactive  . Days of Exercise per Week: 0 days  . Minutes of Exercise per Session: 0 min  Stress: No Stress Concern Present  . Feeling of Stress : Only a little  Social Connections: Slightly Isolated  . Frequency of Communication with Friends and Family: More than three times a week  . Frequency of Social Gatherings with Friends and Family: More than three times a week  . Attends Religious Services: More than 4 times per year  . Active Member of Clubs or Organizations: Yes  . Attends Archivist Meetings:  More than 4 times per year  . Marital Status: Widowed  Intimate Partner Violence: Not At Risk  . Fear of Current or Ex-Partner: No  . Emotionally Abused: No  . Physically Abused: No  . Sexually Abused: No    FAMILY HISTORY:  Family History  Problem Relation Age of Onset  . Heart failure Mother   . Heart failure Father     CURRENT MEDICATIONS:  Outpatient Encounter Medications as of 09/28/2019  Medication Sig  . amLODipine (NORVASC) 5 MG tablet Take 5 mg by mouth daily.  [name] CISPLATIN IV Inject into the vein once a week.  . DENTA 5000 PLUS 1.1 % CREA dental cream BRUSH TEETH FOR 2 MINUTES THEN SPIT  OUT EXCESS, DO NOT SWALLOW OR RINSE AFTERWARDS, NOT COVER  . dronabinol (MARINOL) 5 MG capsule Take 1 capsule (5 mg total) by mouth 2 (two) times daily before a meal.  . Multiple Vitamins-Minerals (MULTIVITAMIN WITH MINERALS) tablet Take 1 tablet by mouth daily.  [name] omeprazole (PRILOSEC) 40 MG capsule Take 40 mg by mouth daily.   [name] acetaminophen (TYLENOL) 500 MG tablet Take 500 mg by mouth every 8 (eight) hours as needed (pain).   . chlorhexidine (PERIDEX) 0.12 % solution Use as directed 15 mLs in the mouth or throat 2 (two) times daily. (Patient not taking: Reported on 08/09/2019)  . diclofenac (VOLTAREN) 75 MG EC tablet Take 75 mg by mouth 2 (two) times daily as needed (pain).   [name] HYDROcodone-acetaminophen (NORCO) 5-325 MG tablet Take 1 tablet by mouth every 4 (four) hours as needed for moderate pain. (Patient not taking: Reported on 08/09/2019)  . lidocaine (XYLOCAINE) 2 % solution Patient: Mix 1part 2% viscous lidocaine, 1part H20. Swish & swallow 32mL of diluted mixture, 54min before meals and at bedtime, up to QID. May otherwise swish and spit up to 8 times daily. (Patient not taking: Reported on 08/24/2019)  . lidocaine-prilocaine (EMLA) cream Apply small amount to port a cath site and cover with plastic wrap one hour prior to chemotherapy appointments. (Patient not taking: Reported on 08/24/2019)  . prochlorperazine (COMPAZINE) 10 MG tablet Take 1 tablet (10 mg total) by mouth every 6 (six) hours as needed (Nausea or vomiting). (Patient not taking: Reported on 08/24/2019)   Facility-Administered Encounter Medications as of 09/28/2019  Medication  . heparin lock flush 100 unit/mL  . sodium chloride 0.9 % 1,000 mL with potassium chloride 20 mEq, magnesium sulfate 2 g infusion  . sodium chloride flush (NS) 0.9 % injection 10 mL    ALLERGIES:  No Known Allergies   PHYSICAL EXAM:  ECOG Performance status: 0  Vitals:   09/28/19 1412  BP: 118/74  Pulse: (!) 101  Resp: 18  Temp: (!) 97.3  F (36.3 C)  SpO2: 100%   Filed Weights   09/28/19 1412  Weight: 156 lb 11.2 oz (71.1 kg)    Physical Exam Vitals reviewed.  Constitutional:      Appearance: Normal appearance.  HENT:     Mouth/Throat:     Comments: Right lateral tongue is well-healed.   Cardiovascular:     Rate and Rhythm: Normal rate and regular rhythm.     Heart sounds: Normal heart sounds.  Pulmonary:     Effort: Pulmonary effort is normal.     Breath sounds: Normal breath sounds.  Abdominal:     General: There is no distension.     Palpations: Abdomen is soft. There is no mass.  Musculoskeletal:  General: No swelling.  Lymphadenopathy:     Cervical: No cervical adenopathy.  Skin:    General: Skin is warm.  Neurological:     General: No focal deficit present.     Mental Status: She is alert and oriented to person, place, and time.  Psychiatric:        Mood and Affect: Mood normal.        Behavior: Behavior normal.   No oropharyngeal lesions.  No neck adenopathy.   LABORATORY DATA:  I have reviewed the labs as listed.  CBC    Component Value Date/Time   WBC 2.6 (L) 09/28/2019 1349   RBC 4.11 09/28/2019 1349   HGB 12.5 09/28/2019 1349   HCT 38.8 09/28/2019 1349   PLT 154 09/28/2019 1349   MCV 94.4 09/28/2019 1349   MCH 30.4 09/28/2019 1349   MCHC 32.2 09/28/2019 1349   RDW 15.7 (H) 09/28/2019 1349   LYMPHSABS 0.3 (L) 09/28/2019 1349   MONOABS 0.4 09/28/2019 1349   EOSABS 0.1 09/28/2019 1349   BASOSABS 0.0 09/28/2019 1349   CMP Latest Ref Rng & Units 09/28/2019 08/24/2019 08/09/2019  Glucose 70 - 99 mg/dL 105(H) 96 82  BUN 8 - 23 mg/dL 17 21 22   Creatinine 0.44 - 1.00 mg/dL 1.14(H) 1.29(H) 0.90  Sodium 135 - 145 mmol/L 134(L) 135 132(L)  Potassium 3.5 - 5.1 mmol/L 3.9 3.6 4.3  Chloride 98 - 111 mmol/L 97(L) 92(L) 96(L)  CO2 22 - 32 mmol/L 28 30 23   Calcium 8.9 - 10.3 mg/dL 9.5 10.0 9.2  Total Protein 6.5 - 8.1 g/dL 7.3 7.4 7.3  Total Bilirubin 0.3 - 1.2 mg/dL 0.6 0.6 0.6    Alkaline Phos 38 - 126 U/L 48 53 64  AST 15 - 41 U/L 22 23 25   ALT 0 - 44 U/L 19 24 28        DIAGNOSTIC IMAGING:  I have independently reviewed the scans and discussed with the patient.   I have reviewed Venita Lick LPN's note and agree with the documentation.  I personally performed a face-to-face visit, made revisions and my assessment and plan is as follows.    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 Nicolette Bang at [facility]. -Pathology showed 4.2 cm tumor, margins negative, 17 mm depth of invasion, moderately to poorly differentiated, positive neutral invasion, 2/17 lymph nodes positive, ECE positive, pT4a, PN 3B. -Will obtain p16 staining results from [facility]. -Because of adverse features including ECE, positive perineural invasion, T4 primary and N3 disease, she was recommended systemic therapy with RT as a category 1 recommendation. -IMRT 60 Gy in 30 fractions started on 06/27/2019. -Weekly cisplatin from 06/27/2019 through 07/25/2019. -She was seen by Dr. Nicolette Bang at [facility] on 09/27/2019.  Motor examination was normal. -Physical exam today did not show any visible masses in the tongue or oropharynx.  No palpable neck adenopathy was seen. -We reviewed her labs.  White count is slightly low at 2.6 with normal ANC. -I have recommended a PET CT scan on follow-up in 3 to 4 weeks.  We will also check a TSH level. -She wonders when she can go back to work.  At this time she feels tired by the afternoon and cannot do part-time work for her job.  We will reevaluate her in 1  month.  2.  Nutrition: -She is drinking 3 cans of Ensure.  She has used Marinol in the past without help. -She lost about 2 pounds since last visit 4 weeks ago.  Thick secretions in  the throat are improving.  She reports that she can taste foods and denies any difficulty swallowing.  She feels like she has to put extra effort to eat. -I have encouraged her to start eating more soft foods.  We have also told her to change Ensure to Ensure Plus that she can get more calories. -She is drinking fiber for constipation which does not always help.  She will also follow-up with our nutritionist.  3.  Dizziness: -She reported occasional dizziness when she is sitting or standing. -Blood pressure today systolic 183/35.  I reviewed her medications.  Amlodipine 5 mg will be discontinued. -I will reevaluate her in 4 weeks.  Total time spent is 25 minutes with more than 50% of the time spent face-to-face discussing surveillance plan, nutrition plan, counseling and coordination of care.    Orders placed this encounter:  Orders Placed This Encounter  Procedures  . NM PET Image Restag (PS) Skull Base To Thigh  . CBC with Differential/Platelet  . Comprehensive metabolic panel  . TSH      Derek Jack, [place] [phone]

## 2019-11-01 ENCOUNTER — Telehealth: Payer: Self-pay | Admitting: *Deleted

## 2019-11-01 NOTE — Telephone Encounter (Signed)
Called patient to inform of CT for 11-11-19 - arrival time - 3:45 pm @ [facility], patient to have water only- 4 hrs. prior to test, patient to receive results from Dr. Isidore Moos on 11-22-19 @ 2:20 pm, spoke with patient and she is aware of these appts.

## 2019-11-04 ENCOUNTER — Telehealth (HOSPITAL_COMMUNITY): Payer: Self-pay

## 2019-11-04 ENCOUNTER — Other Ambulatory Visit: Payer: Self-pay

## 2019-11-04 ENCOUNTER — Ambulatory Visit: Payer: Self-pay | Admitting: Radiation Oncology

## 2019-11-04 ENCOUNTER — Encounter (HOSPITAL_COMMUNITY)
Admission: RE | Admit: 2019-11-04 | Discharge: 2019-11-04 | Disposition: A | Discharge status: Home / Self Care | Payer: BC Managed Care – PPO | Source: Ambulatory Visit | Attending: Hematology | Admitting: Hematology

## 2019-11-04 DIAGNOSIS — C023 Malignant neoplasm of anterior two-thirds of tongue, part unspecified: Secondary | ICD-10-CM | POA: Diagnosis present

## 2019-11-04 LAB — GLUCOSE, CAPILLARY: Glucose-Capillary: 78 mg/dL (ref 70–99)

## 2019-11-04 MED ORDER — FLUDEOXYGLUCOSE F - 18 (FDG) INJECTION
9.7700 | Freq: Once | INTRAVENOUS | Status: AC
  Administered 2019-11-04: 8.77 via INTRAVENOUS

## 2019-11-04 NOTE — Telephone Encounter (Signed)
Nutrition Follow-up:   Patient with SCC of the tongue s/p right excision and right neck dissection on 8/7 at [facility].  Patient has completed radiation (10/30) and chemotherapy (last treatment held).    Spoke with patient via phone for nutrition follow-up.  Reports that she just got home from PET scan.  Patient reports that she is still struggling to eat.  Eating is exhausting, not enjoyable,and she is often worried that she is chewing on tongue.  She reports dry mouth and to some degree thick saliva.  Reports that she can taste foods.  Has been drinking ensure/boost shakes daily, eating chicken noodle and broth based soups.  Tried mashed potatoes but too thick.  Has been taking fiber supplement and colace to help with constipation.      Medications: reviewed  Labs: no recent  Anthropometrics:   Weight 156 lb 11.2 oz on 12/23 decreased from 169 lb on 10/30.    Starting weight 188 lb on 9/21   NUTRITION DIAGNOSIS: Inadequate oral intake continues   INTERVENTION:  Discussed strategies to help with dry mouth and thick saliva.   Encouraged drinking 4-6 ensure enlive/plus daily to better meet nutritional needs Patient denied needing additional case of ensure today.  Patient has contact information    MONITORING, EVALUATION, GOAL: oral intake, weight   NEXT VISIT: phone f/u Feb 26  Nephi Savage B. Zenia Resides, [place], [place] Registered Dietitian [phone] (pager)

## 2019-11-07 ENCOUNTER — Encounter (HOSPITAL_COMMUNITY): Payer: Self-pay

## 2019-11-07 ENCOUNTER — Other Ambulatory Visit: Payer: Self-pay

## 2019-11-07 ENCOUNTER — Inpatient Hospital Stay (HOSPITAL_COMMUNITY): Payer: BC Managed Care – PPO | Attending: Hematology

## 2019-11-07 DIAGNOSIS — I10 Essential (primary) hypertension: Secondary | ICD-10-CM | POA: Insufficient documentation

## 2019-11-07 DIAGNOSIS — R1310 Dysphagia, unspecified: Secondary | ICD-10-CM | POA: Insufficient documentation

## 2019-11-07 DIAGNOSIS — K5900 Constipation, unspecified: Secondary | ICD-10-CM | POA: Insufficient documentation

## 2019-11-07 DIAGNOSIS — C023 Malignant neoplasm of anterior two-thirds of tongue, part unspecified: Secondary | ICD-10-CM | POA: Insufficient documentation

## 2019-11-07 DIAGNOSIS — Z79899 Other long term (current) drug therapy: Secondary | ICD-10-CM | POA: Diagnosis not present

## 2019-11-07 DIAGNOSIS — Z9221 Personal history of antineoplastic chemotherapy: Secondary | ICD-10-CM | POA: Diagnosis not present

## 2019-11-07 DIAGNOSIS — B370 Candidal stomatitis: Secondary | ICD-10-CM | POA: Diagnosis not present

## 2019-11-07 DIAGNOSIS — Z791 Long term (current) use of non-steroidal anti-inflammatories (NSAID): Secondary | ICD-10-CM | POA: Insufficient documentation

## 2019-11-07 LAB — COMPREHENSIVE METABOLIC PANEL
ALT: 15 U/L (ref 0–44)
AST: 20 U/L (ref 15–41)
Albumin: 3.7 g/dL (ref 3.5–5.0)
Alkaline Phosphatase: 48 U/L (ref 38–126)
Anion gap: 8 (ref 5–15)
BUN: 16 mg/dL (ref 8–23)
CO2: 25 mmol/L (ref 22–32)
Calcium: 9.2 mg/dL (ref 8.9–10.3)
Chloride: 102 mmol/L (ref 98–111)
Creatinine, Ser: 0.95 mg/dL (ref 0.44–1.00)
GFR calc Af Amer: >60 mL/min (ref >60)
GFR calc non Af Amer: >60 mL/min (ref >60)
Glucose, Bld: 99 mg/dL (ref 70–99)
Potassium: 3.9 mmol/L (ref 3.5–5.1)
Sodium: 135 mmol/L (ref 135–145)
Total Bilirubin: 0.6 mg/dL (ref 0.3–1.2)
Total Protein: 6.8 g/dL (ref 6.5–8.1)

## 2019-11-07 LAB — CBC WITH DIFFERENTIAL/PLATELET
Abs Immature Granulocytes: 0 10*3/uL (ref 0.00–0.07)
Basophils Absolute: 0 10*3/uL (ref 0.0–0.1)
Basophils Relative: 0 %
Eosinophils Absolute: 0.1 10*3/uL (ref 0.0–0.5)
Eosinophils Relative: 3 %
HCT: 35.3 % — ABNORMAL LOW (ref 36.0–46.0)
Hemoglobin: 11.4 g/dL — ABNORMAL LOW (ref 12.0–15.0)
Immature Granulocytes: 0 %
Lymphocytes Relative: 10 %
Lymphs Abs: 0.3 10*3/uL — ABNORMAL LOW (ref 0.7–4.0)
MCH: 30.8 pg (ref 26.0–34.0)
MCHC: 32.3 g/dL (ref 30.0–36.0)
MCV: 95.4 fL (ref 80.0–100.0)
Monocytes Absolute: 0.3 10*3/uL (ref 0.1–1.0)
Monocytes Relative: 13 %
Neutro Abs: 2 10*3/uL (ref 1.7–7.7)
Neutrophils Relative %: 74 %
Platelets: 147 10*3/uL — ABNORMAL LOW (ref 150–400)
RBC: 3.7 MIL/uL — ABNORMAL LOW (ref 3.87–5.11)
RDW: 12.9 % (ref 11.5–15.5)
WBC: 2.6 10*3/uL — ABNORMAL LOW (ref 4.0–10.5)
nRBC: 0 % (ref 0.0–0.2)

## 2019-11-07 LAB — TSH: TSH: 3.242 u[IU]/mL (ref 0.350–4.500)

## 2019-11-07 MED ORDER — SODIUM CHLORIDE 0.9% FLUSH
10.0000 mL | Freq: Once | INTRAVENOUS | Status: AC
  Administered 2019-11-07: 10 mL via INTRAVENOUS

## 2019-11-07 MED ORDER — HEPARIN SOD (PORK) LOCK FLUSH 100 UNIT/ML IV SOLN
500.0000 [IU] | Freq: Once | INTRAVENOUS | Status: AC
  Administered 2019-11-07: 500 [IU] via INTRAVENOUS

## 2019-11-07 NOTE — Progress Notes (Signed)
Labs drawn from port.  Patients port flushed without difficulty.  Good blood return noted with no bruising or swelling noted at site.  Band aid applied.  VSS with discharge and left ambulatory with no s/s of distress noted.

## 2019-11-08 ENCOUNTER — Inpatient Hospital Stay (HOSPITAL_BASED_OUTPATIENT_CLINIC_OR_DEPARTMENT_OTHER): Payer: BC Managed Care – PPO | Admitting: Hematology

## 2019-11-08 ENCOUNTER — Encounter (HOSPITAL_COMMUNITY): Payer: Self-pay | Admitting: Hematology

## 2019-11-08 VITALS — BP 133/83 | HR 86 | Temp 97.1°F | Resp 18 | Wt 153.6 lb

## 2019-11-08 DIAGNOSIS — C023 Malignant neoplasm of anterior two-thirds of tongue, part unspecified: Secondary | ICD-10-CM

## 2019-11-08 MED ORDER — FLUCONAZOLE 100 MG PO TABS: 100.0000 mg | ORAL_TABLET | Freq: Every day | ORAL | 0 refills | Status: DC

## 2019-11-08 NOTE — Assessment & Plan Note (Signed)
1. 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 Nicolette Bang at [facility]. -Pathology showed 4.2 cm tumor, margins negative, 17 mm depth of invasion, moderately to poorly differentiated, positive neutral invasion, 2/17 lymph nodes positive, ECE positive, pT4a, PN 3B. -Will obtain p16 staining results from [facility]. -Because of adverse features including ECE, positive perineural invasion, T4 primary and N3 disease, she was recommended systemic therapy with RT as a category 1 recommendation. -IMRT 60 Gy in 30 fractions started on 06/27/2019. -Weekly cisplatin from 06/27/2019 through 07/25/2019. -She was seen by Dr. Nicolette Bang at [facility] on 09/27/2019.  Motor examination was normal. -Physical exam showed thrush on the tongue.  No palpable adenopathy in the neck region or submental. -We reviewed PET scan from 11/04/2019.  I have reviewed the images with the patient.  There is intense activity in the right base of the tongue, likely postsurgical.  There is hypermetabolic level 1B in the right submental space, measuring 5 mm.  There is a new left upper lobe pulmonary nodule measuring 8 mm in the right upper lobe pulmonary nodule measuring 5 mm. -We will send her Diflucan for 10 days for the thrush. -I have talked to Dr. Nicolette Bang at [facility].  He will reviewed the scan and see the patient in March.  He will attempt to do ultrasound guided biopsy at that time.  I have scheduled her for follow-up PET CT scan in 3 months.  The PET positive right submental node as well as left upper lobe lung nodule or too small to intervene at this time.  If the lung nodule does not increase in size on the subsequent scan, and the submental lymph node increases in size, surgical resection is an option.  If the both increase in size and are biopsy proven to be squamous cell carcinoma,  systemic therapy will be recommended.  2.  Nutrition: -She lost 3 pounds since December. -She reported drinking boost 4 to 5 cans/day.  She is also able to eat soft foods like soups and mashed potatoes.

## 2019-11-08 NOTE — Patient Instructions (Signed)
[place] at [facility] Discharge Instructions  You were seen today by Dr. Delton Coombes. He went over your recent lab and scan results. He will send in a prescription for Diflucan to your pharmacy. He will repeat a PET scan in 3 months. He will see you back after your scan for labs and follow up.   Thank you for choosing [place] at [facility] to provide your oncology and hematology care.  To afford each patient quality time with our provider, please arrive at least 15 minutes before your scheduled appointment time.   If you have a lab appointment with the [facility] please come in thru the  Main Entrance and check in at the main information desk  You need to re-schedule your appointment should you arrive 10 or more minutes late.  We strive to give you quality time with our providers, and arriving late affects you and other patients whose appointments are after yours.  Also, if you no show three or more times for appointments you may be dismissed from the clinic at the providers discretion.     Again, thank you for choosing [facility].  Our hope is that these requests will decrease the amount of time that you wait before being seen by our physicians.       _____________________________________________________________  Should you have questions after your visit to [facility], please contact our office at [phone] between the hours of 8:00 a.m. and 4:30 p.m.  Voicemails left after 4:00 p.m. will not be returned until the following business day.  For prescription refill requests, have your pharmacy contact our office and allow 72 hours.    Cancer Center Support Programs:   > Cancer Support Group  2nd Tuesday of the month 1pm-2pm, Journey Room

## 2019-11-08 NOTE — Progress Notes (Signed)
Terri [name], [place] [address]   CLINIC:  Medical Oncology/Hematology  PCP:  [name], DO [address] [name] [address] [phone]   REASON FOR VISIT:  Follow-up for right tongue cancer.    INTERVAL HISTORY:  Terri [name] 69 y.o. 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  She has completed chemotherapy in October 2020.  She lost 3 pounds since the last visit in December.  She is having trouble swallowing solid foods.  She is drinking about 4 to 5 cans of boost per day.  She is also able to eat soft foods like soups and mashed potatoes.  Denies any episodes of aspiration.  Appetite is 25%.  Energy levels are 75%.  She has baseline constipation which is stable.  REVIEW OF SYSTEMS:  Review of Systems  Gastrointestinal: Positive for constipation.  All other systems reviewed and are negative.    PAST MEDICAL/SURGICAL HISTORY:  Past Medical History:  Diagnosis Date  . Hypertension   . Port-A-Cath in place 06/14/2019   Past Surgical History:  Procedure Laterality Date  . 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 Dr. Nicolette Bang at [facility]  . PORTACATH PLACEMENT Left 06/10/2019   Procedure: INSERTION PORT-A-CATH;  Surgeon: Aviva Signs, MD;  Location: [facility];  Service: General;  Laterality: Left;  pt knows to arrive at 6:15  . TUBAL LIGATION       SOCIAL HISTORY:  Social History   Socioeconomic History  . Marital status: Widowed    Spouse name: Not on file  . Number of children: 2  . Years of education: Not on file  . Highest education level: Not on file  Occupational History  . Not on file  Tobacco Use  . Smoking status: Never Smoker  . Smokeless tobacco: Never Used  Substance and Sexual Activity  . Alcohol use: Never  . Drug use: Never  . Sexual activity: Not on file  Other Topics  Concern  . Not on file  Social History Narrative      Patient is a widow.   Patient lives with her 2 sons in [place], [place].   Patient is a non-smoker, nondrinker.  Patient has never used chew.  Patient does not use illicit drugs.   Social Determinants of Health   Financial Resource Strain: Low Risk   . Difficulty of Paying Living Expenses: Not hard at all  Food Insecurity: No Food Insecurity  . Worried About Charity fundraiser in the Last Year: Never true  . Ran Out of Food in the Last Year: Never true  Transportation Needs: No Transportation Needs  . Lack of Transportation (Medical): No  . Lack of Transportation (Non-Medical): No  Physical Activity: Inactive  . Days of Exercise per Week: 0 days  . Minutes of Exercise per Session: 0 min  Stress: No Stress Concern Present  . Feeling of Stress : Only a little  Social Connections: Slightly Isolated  . Frequency of Communication with Friends and Family: More than three times a week  . Frequency of Social Gatherings with Friends and Family: More than three times a week  . Attends Religious Services: More than 4 times per year  . Active Member of Clubs or Organizations: Yes  . Attends Archivist Meetings: More than 4 times per year  . Marital Status: Widowed  Intimate Partner Violence: Not At Risk  . Fear of Current or Ex-Partner: No  . Emotionally Abused: No  . Physically Abused: No  . Sexually Abused: No    FAMILY HISTORY:  Family History  Problem Relation Age of Onset  . Heart failure Mother   . Heart failure Father     CURRENT MEDICATIONS:  Outpatient Encounter Medications as of 11/08/2019  Medication Sig  . amLODipine (NORVASC) 5 MG tablet Take 5 mg by mouth daily.  [name] CISPLATIN IV Inject into the vein once a week.  . DENTA 5000 PLUS 1.1 % CREA dental cream BRUSH TEETH FOR 2 MINUTES THEN SPIT OUT EXCESS, DO NOT SWALLOW OR RINSE AFTERWARDS, NOT COVER  . dronabinol (MARINOL) 5 MG capsule Take 1 capsule (5  mg total) by mouth 2 (two) times daily before a meal.  . Multiple Vitamins-Minerals (MULTIVITAMIN WITH MINERALS) tablet Take 1 tablet by mouth daily.  [name] omeprazole (PRILOSEC) 40 MG capsule Take 40 mg by mouth daily.   [name] acetaminophen (TYLENOL) 500 MG tablet Take 500 mg by mouth every 8 (eight) hours as needed (pain).   . chlorhexidine (PERIDEX) 0.12 % solution Use as directed 15 mLs in the mouth or throat 2 (two) times daily. (Patient not taking: Reported on 11/08/2019)  . diclofenac (VOLTAREN) 75 MG EC tablet Take 75 mg by mouth 2 (two) times daily as needed (pain).   . fluconazole (DIFLUCAN) 100 MG tablet Take 1 tablet (100 mg total) by mouth daily.  [name] HYDROcodone-acetaminophen (NORCO) 5-325 MG tablet Take 1 tablet by mouth every 4 (four) hours as needed for moderate pain. (Patient not taking: Reported on 11/08/2019)  . lidocaine (XYLOCAINE) 2 % solution Patient: Mix 1part 2% viscous lidocaine, 1part H20. Swish & swallow 24mL of diluted mixture, 33min before meals and at bedtime, up to QID. May otherwise swish and spit up to 8 times daily. (Patient not taking: Reported on 11/08/2019)  . lidocaine-prilocaine (EMLA) cream Apply small amount to port a cath site and cover with plastic wrap one hour prior to chemotherapy appointments. (Patient not taking: Reported on 11/08/2019)  . prochlorperazine (COMPAZINE) 10 MG tablet Take 1 tablet (10 mg total) by mouth every 6 (six) hours as needed (Nausea or vomiting). (Patient not taking: Reported on 11/08/2019)   Facility-Administered Encounter Medications as of 11/08/2019  Medication  . heparin lock flush 100 unit/mL  . sodium chloride 0.9 % 1,000 mL with potassium chloride 20 mEq, magnesium sulfate 2 g infusion  . sodium chloride flush (NS) 0.9 % injection 10 mL    ALLERGIES:  No Known Allergies   PHYSICAL EXAM:  ECOG Performance status: 0  Vitals:   11/08/19 1439  BP: 133/83  Pulse: 86  Resp: 18  Temp: (!) 97.1 F (36.2 C)  SpO2: 100%   Filed Weights    11/08/19 1439  Weight: 153 lb 9.6 oz (69.7 kg)    Physical Exam Vitals reviewed.  Constitutional:      Appearance: Normal appearance.  HENT:     Mouth/Throat:     Comments: Right lateral tongue is well-healed.   Cardiovascular:     Rate and Rhythm: Normal rate and regular rhythm.     Heart sounds: Normal heart sounds.  Pulmonary:     Effort: Pulmonary effort is normal.     Breath sounds: Normal breath sounds.  Abdominal:     General: There is no distension.     Palpations: Abdomen is soft. There is no mass.  Musculoskeletal:  General: No swelling.  Lymphadenopathy:     Cervical: No cervical adenopathy.  Skin:    General: Skin is warm.  Neurological:     General: No focal deficit present.     Mental Status: She is alert and oriented to person, place, and time.  Psychiatric:        Mood and Affect: Mood normal.        Behavior: Behavior normal.   No oropharyngeal lesions.  No neck adenopathy.   LABORATORY DATA:  I have reviewed the labs as listed.  CBC    Component Value Date/Time   WBC 2.6 (L) 11/07/2019 1403   RBC 3.70 (L) 11/07/2019 1403   HGB 11.4 (L) 11/07/2019 1403   HCT 35.3 (L) 11/07/2019 1403   PLT 147 (L) 11/07/2019 1403   MCV 95.4 11/07/2019 1403   MCH 30.8 11/07/2019 1403   MCHC 32.3 11/07/2019 1403   RDW 12.9 11/07/2019 1403   LYMPHSABS 0.3 (L) 11/07/2019 1403   MONOABS 0.3 11/07/2019 1403   EOSABS 0.1 11/07/2019 1403   BASOSABS 0.0 11/07/2019 1403   CMP Latest Ref Rng & Units 11/07/2019 09/28/2019 08/24/2019  Glucose 70 - 99 mg/dL 99 105(H) 96  BUN 8 - 23 mg/dL 16 17 21   Creatinine 0.44 - 1.00 mg/dL 0.95 1.14(H) 1.29(H)  Sodium 135 - 145 mmol/L 135 134(L) 135  Potassium 3.5 - 5.1 mmol/L 3.9 3.9 3.6  Chloride 98 - 111 mmol/L 102 97(L) 92(L)  CO2 22 - 32 mmol/L 25 28 30   Calcium 8.9 - 10.3 mg/dL 9.2 9.5 10.0  Total Protein 6.5 - 8.1 g/dL 6.8 7.3 7.4  Total Bilirubin 0.3 - 1.2 mg/dL 0.6 0.6 0.6  Alkaline Phos 38 - 126 U/L 48 48 53    AST 15 - 41 U/L 20 22 23   ALT 0 - 44 U/L 15 19 24        DIAGNOSTIC IMAGING:  I have independently reviewed the scans and discussed with the patient.   I have reviewed Terri Lick LPN's note and agree with the documentation.  I personally performed a face-to-face visit, made revisions and my assessment and plan is as follows.    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 Nicolette Bang at [facility]. -Pathology showed 4.2 cm tumor, margins negative, 17 mm depth of invasion, moderately to poorly differentiated, positive neutral invasion, 2/17 lymph nodes positive, ECE positive, pT4a, PN 3B. -Will obtain p16 staining results from [facility]. -Because of adverse features including ECE, positive perineural invasion, T4 primary and N3 disease, she was recommended systemic therapy with RT as a category 1 recommendation. -IMRT 60 Gy in 30 fractions started on 06/27/2019. -Weekly cisplatin from 06/27/2019 through 07/25/2019. -She was seen by Dr. Nicolette Bang at [facility] on 09/27/2019.  Motor examination was normal. -Physical exam showed thrush on the tongue.  No palpable adenopathy in the neck region or submental. -We reviewed PET scan from 11/04/2019.  I have reviewed the images with the patient.  There is intense activity in the right base of the tongue, likely postsurgical.  There is hypermetabolic level 1B in the right submental space, measuring 5 mm.  There is a new left upper lobe pulmonary nodule measuring 8 mm in the right upper lobe pulmonary nodule measuring 5 mm. -We will send her Diflucan for  10 days for the thrush. -I have talked to Dr. Nicolette Bang at [facility].  He will reviewed the scan and see the patient in March.  He will attempt to do ultrasound guided  biopsy at that time.  I have scheduled her for follow-up PET CT scan in 3 months.  The PET positive right submental node as well as left upper lobe lung nodule or too small to intervene at this time.  If the lung nodule does not increase in size on the subsequent scan, and the submental lymph node increases in size, surgical resection is an option.  If the both increase in size and are biopsy proven to be squamous cell carcinoma, systemic therapy will be recommended.  2.  Nutrition: -She lost 3 pounds since December. -She reported drinking boost 4 to 5 cans/day.  She is also able to eat soft foods like soups and mashed potatoes.        Orders placed this encounter:  Orders Placed This Encounter  Procedures  . NM PET Image Restag (PS) Skull Base To Thigh      Derek Jack, MD [place] [phone]

## 2019-11-09 ENCOUNTER — Encounter: Payer: Self-pay | Admitting: Radiation Oncology

## 2019-11-09 ENCOUNTER — Encounter (HOSPITAL_COMMUNITY): Payer: BC Managed Care – PPO

## 2019-11-11 ENCOUNTER — Telehealth: Payer: Self-pay | Admitting: *Deleted

## 2019-11-11 ENCOUNTER — Ambulatory Visit (HOSPITAL_COMMUNITY)
Admission: RE | Admit: 2019-11-11 | Discharge: 2019-11-11 | Disposition: A | Discharge status: Home / Self Care | Payer: BC Managed Care – PPO | Source: Ambulatory Visit | Attending: Radiation Oncology | Admitting: Radiation Oncology

## 2019-11-11 ENCOUNTER — Other Ambulatory Visit: Payer: Self-pay

## 2019-11-11 DIAGNOSIS — C021 Malignant neoplasm of border of tongue: Secondary | ICD-10-CM

## 2019-11-11 MED ORDER — IOHEXOL 300 MG/ML  SOLN
75.0000 mL | Freq: Once | INTRAMUSCULAR | Status: AC | PRN
  Administered 2019-11-11: 75 mL via INTRAVENOUS

## 2019-11-11 NOTE — Telephone Encounter (Signed)
Connected with [employer] in reference to [employer] claim #[id number] to provide Medical oncologist name, office information, Radiation therapy completed and 11/04/2019 appointment was cancelled.      [employer] reports form our office received 11/04/2019 was been faxed to other provider; do not need form from your office.

## 2019-11-22 ENCOUNTER — Ambulatory Visit
Admission: RE | Admit: 2019-11-22 | Discharge: 2019-11-22 | Disposition: A | Discharge status: Home / Self Care | Payer: BC Managed Care – PPO | Source: Ambulatory Visit | Attending: Radiation Oncology | Admitting: Radiation Oncology

## 2019-11-22 DIAGNOSIS — C023 Malignant neoplasm of anterior two-thirds of tongue, part unspecified: Secondary | ICD-10-CM

## 2019-11-22 HISTORY — DX: Personal history of irradiation: Z92.3

## 2019-11-22 NOTE — Progress Notes (Signed)
Radiation Oncology         [phone] ________________________________  Name: Terri [name] MRN: [id number]  Date: 11/22/2019  DOB: 02/05/1951  Follow-Up Visit Note by telephone as patient opted for telemedicine was unable to access MyChart video during pandemic precautions   CC: [name], DO  [name], DO  Diagnosis and Prior Radiotherapy:       ICD-10-CM   1. 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)  C02.3     06/27/2019 through 08/05/2019  Site Technique Total Dose (Gy) Dose per Fx (Gy) Completed Fx Beam Energies  Head & neck: HN_tongue IMRT 60/60 2 30/30 6X    CHIEF COMPLAINT:   tongue cancer  Narrative:   [name] is feeling very good overall.  Her main symptomatic complaint is dry mouth.  PET scan performed for restaging at the end of January showed a hypermetabolic left upper lung pulmonary nodule and a smaller new nodule in the right upper lobe also concerning for metastasis.  Additionally there was a right level 1B lymph node that was hypermetabolic concerning for metastatic adenopathy.  Of note, the right level 1B lymph node was included in the 60 Gy isodose line during her treatment.  This node was visible at time of CT simulation, see snapshot below.  The patient has been followed by Dr. Delton Coombes and has been referred to Dr. Nicolette Bang for biopsy of the 1B node.  There is a plan to restage her with another PET scan in May to determine plans of care.  According to Dr. Tomie China plan, if the node is bx-positive but she does not show clear metastatic disease on subsequent PET she may be a candidate for salvage neck surgery.  If she shows clear evidence of distant metastatic disease, systemic therapy may be given rather than surgical management.  ALLERGIES:  has No Known Allergies.  Meds: Current Outpatient Medications  Medication Sig Dispense Refill  . acetaminophen (TYLENOL) 500 MG tablet Take 500 mg by mouth every 8  (eight) hours as needed (pain).     [name] amLODipine (NORVASC) 5 MG tablet Take 5 mg by mouth daily.    . chlorhexidine (PERIDEX) 0.12 % solution Use as directed 15 mLs in the mouth or throat 2 (two) times daily. (Patient not taking: Reported on 11/08/2019) 120 mL 2  . CISPLATIN IV Inject into the vein once a week.    . DENTA 5000 PLUS 1.1 % CREA dental cream BRUSH TEETH FOR 2 MINUTES THEN SPIT OUT EXCESS, DO NOT SWALLOW OR RINSE AFTERWARDS, NOT COVER    . diclofenac (VOLTAREN) 75 MG EC tablet Take 75 mg by mouth 2 (two) times daily as needed (pain).     [name] dronabinol (MARINOL) 5 MG capsule Take 1 capsule (5 mg total) by mouth 2 (two) times daily before a meal. 30 capsule 0  . fluconazole (DIFLUCAN) 100 MG tablet Take 1 tablet (100 mg total) by mouth daily. 10 tablet 0  . HYDROcodone-acetaminophen (NORCO) 5-325 MG tablet Take 1 tablet by mouth every 4 (four) hours as needed for moderate pain. (Patient not taking: Reported on 11/08/2019) 20 tablet 0  . lidocaine (XYLOCAINE) 2 % solution Patient: Mix 1part 2% viscous lidocaine, 1part H20. Swish & swallow 77mL of diluted mixture, 35min before meals and at bedtime, up to QID. May otherwise swish and spit up to 8 times daily. (Patient not taking: Reported on 11/08/2019) 100 mL 5  . lidocaine-prilocaine (EMLA) cream Apply small amount to  port a cath site and cover with plastic wrap one hour prior to chemotherapy appointments. (Patient not taking: Reported on 11/08/2019) 30 g 3  . Multiple Vitamins-Minerals (MULTIVITAMIN WITH MINERALS) tablet Take 1 tablet by mouth daily.    [name] omeprazole (PRILOSEC) 40 MG capsule Take 40 mg by mouth daily.     . prochlorperazine (COMPAZINE) 10 MG tablet Take 1 tablet (10 mg total) by mouth every 6 (six) hours as needed (Nausea or vomiting). (Patient not taking: Reported on 11/08/2019) 30 tablet 1   No current facility-administered medications for this encounter.   Facility-Administered Medications Ordered in Other Encounters  Medication  Dose Route Frequency Provider Last Rate Last Admin  . heparin lock flush 100 unit/mL  500 Units Intracatheter Once PRN Derek Jack, MD      . sodium chloride 0.9 % 1,000 mL with potassium chloride 20 mEq, magnesium sulfate 2 g infusion   Intravenous Once Derek Jack, MD      . sodium chloride flush (NS) 0.9 % injection 10 mL  10 mL Intracatheter Once PRN Derek Jack, MD        Physical Findings: The patient is in no acute distress. Patient is alert and oriented. Wt Readings from Last 3 Encounters:  11/08/19 153 lb 9.6 oz (69.7 kg)  09/28/19 156 lb 11.2 oz (71.1 kg)  08/24/19 158 lb 1.6 oz (71.7 kg)    vitals were not taken for this visit. .  General: Alert and oriented, in no acute distress  Psychiatric: Judgment and insight are intact. Affect is appropriate.   Lab Findings: Lab Results  Component Value Date   WBC 2.6 (L) 11/07/2019   HGB 11.4 (L) 11/07/2019   HCT 35.3 (L) 11/07/2019   MCV 95.4 11/07/2019   PLT 147 (L) 11/07/2019    Lab Results  Component Value Date   TSH 3.242 11/07/2019    Radiographic Findings: CT Soft Tissue Neck W Contrast  Result Date: 11/12/2019 CLINICAL DATA:  69 year old female with right lateral tongue squamous cell carcinoma treated with surgery, chemo radiation. Evidence of new right level 1 B malignant lymph node and pulmonary metastases on PET-CT last month. EXAM: CT NECK WITH CONTRAST TECHNIQUE: Multidetector CT imaging of the neck was performed using the standard protocol following the bolus administration of intravenous contrast. CONTRAST:  40mL OMNIPAQUE IOHEXOL 300 MG/ML  SOLN COMPARISON:  PET-CT 11/04/2019.  Presentation neck CT 04/18/2019. FINDINGS: Pharynx and larynx: Generalized pharyngeal mucosal space soft tissue thickening compatible with sequelae of XRT. Associated mild parapharyngeal and retropharyngeal stranding. No discrete pharyngeal or laryngeal mass. No residual hyperenhancing right oral tongue lesion is  evident, there is mild heterogeneous hypodensity in the posterior right tongue area of hypermetabolism on the recent PET, series 2, image 43. dental streak artifact in this region. Salivary glands: Mild sublingual space architectural distortion. Right submandibular gland has been resected. Left submandibular gland demonstrates decreased size and enhancement compatible with XRT. Parotid glands are also mildly atrophied and more hyperdense. Thyroid: Stable subcentimeter hypodense nodules in both lobes which do not meet size criteria for ultrasound follow-up. Lymph nodes: XRT and post neck dissection architectural distortion on the right with right level 1 B/2 a level surgical clips. The hypermetabolic lymph node on the recent PET is on series 2, image 64 today and measures 8 mm short axis, versus 5 mm in July. No discrete hyperenhancement in an area of what is favored to be granulation tissue to the right of the hyoid bone on series 2, image 57  today. Other identifiable cervical lymph nodes appear stable or decreased since July. No other lymphadenopathy. Vascular: Right IJ remains in place. Left subclavian approach Port-A-Cath now. Major vascular structures in the neck and at the skull base remain patent. Calcified atherosclerosis at the skull base. Limited intracranial: Stable, negative. Visualized orbits: Negative. Mastoids and visualized paranasal sinuses: Visualized paranasal sinuses and mastoids are stable and well pneumatized. Skeleton: Advanced cervical spine degeneration. No acute or suspicious osseous lesion identified. Upper chest: Round perhaps mildly spiculated new medial subpleural (adjacent to the mediastinum) left upper lobe lung nodule which was hypermetabolic last month is on series 3, image 99 measuring 11 mm. There is also a smaller new 5-6 mm right lateral upper lobe subpleural nodule since July. Aberrant right subclavian artery origin. Mild Calcified aortic atherosclerosis. No superior mediastinal  lymphadenopathy. IMPRESSION: 1. NI-RADS category 3: Enlarging right level 1B hypermetabolic lymph node, and new bilateral upper lobe pulmonary nodules, at least 1 of which is also hypermetabolic on PET. 2. Subtle heterogeneity in the right lateral tongue corresponding to focal hypermetabolism on PET, indeterminate for primary site recurrence. 3. Post XRT and right neck dissection treatment changes otherwise satisfactory. Electronically Signed   By: Genevie Ann M.D.   On: 11/12/2019 08:27   NM PET Image Restag (PS) Skull Base To Thigh  Result Date: 11/04/2019 CLINICAL DATA:  Subsequent treatment strategy for head neck carcinoma. RIGHT tongue cancer. Patient status post partial RIGHT glossectomy and neck dissection. Additional radiation therapy and chemotherapy for high-grade features. EXAM: NUCLEAR MEDICINE PET SKULL BASE TO THIGH TECHNIQUE: 8.7 mCi F-18 FDG was injected intravenously. Full-ring PET imaging was performed from the skull base to thigh after the radiotracer. CT data was obtained and used for attenuation correction and anatomic localization. Fasting blood glucose: 78 mg/dl COMPARISON:  PET-CT 04/15/2019 FINDINGS: Mediastinal blood pool activity: SUV max 2.3 Liver activity: SUV max NA NECK: There is intense activity at the RIGHT base of tongue posterior to the lingual activity seen on comparison exam. Activity is intense with SUV max equal 5.3 which is decreased from the more anterior pre-surgical activity 28.7. This posterior RIGHT lateral glossal activity is favored postsurgical inflammatory metabolic activity. However, there is a discrete hypermetabolic level 1b lymph node on the RIGHT in the anterolateral submental space measuring 5 mm (image 33/4) with SUV max equal 5.8. No hypermetabolic cervical lymph nodes. There is asymmetric activity in the posterior cricopharyngeal musculature on the RIGHT which is nonspecific Incidental CT findings: none CHEST: Within the superomedial aspect of the LEFT upper  lobe there is a new pulmonary nodule measuring 8 mm (image 18/9) with SUV max equal 6.3. A second new pulmonary nodule in the RIGHT upper lobe measures 8 mm (image 17/9) and has faint metabolic activity. Incidental CT findings: Port in the anterior chest wall with tip in distal SVC. ABDOMEN/PELVIS: No abnormal hypermetabolic activity within the liver, pancreas, adrenal glands, or spleen. No hypermetabolic lymph nodes in the abdomen or pelvis. Incidental CT findings: Large uterus.  Ovaries normal. SKELETON: No focal hypermetabolic activity to suggest skeletal metastasis. Incidental CT findings: none IMPRESSION: 1. Focal metabolic activity in the posterior RIGHT base of tongue is favored post surgical inflammation. 2. Small hypermetabolic submental RIGHT level Ib lymph node is highly concerning for metastatic adenopathy 3. Hypermetabolic pulmonary nodule in the medial LEFT upper lobe, new from comparison exam, is consistent with pulmonary metastasis. 4. Smaller new nodule in the RIGHT upper lobe is also concerning for metastatic lesion. Electronically Signed   By:  Suzy Bouchard M.D.   On: 11/04/2019 14:13   Prior RT plan images depicting right IB node in yellow 100% isodose line (60Gy):   Impression/Plan:    1) Head and Neck Cancer Status: Recovering from adjuvant chemoradiation, monitoring PET-avid neck adenopathy and pulmonary nodules  2) Nutritional Status: she reports she is eating well, drinking supplements PEG tube: None  3) Risk Factors: The patient has been educated about risk factors including alcohol and tobacco abuse; they understand that avoidance of alcohol and tobacco is important to prevent recurrences as well as other cancers  4) Swallowing: Functional  5) Dental: Encouraged to continue regular followup with dentistry, and dental hygiene including fluoride rinses.  Discussed ways to address dry mouth (biotene, therabreath lozenges).  6) Thyroid function: Follow annually Lab  Results  Component Value Date   TSH 3.242 11/07/2019    7) Follow-up with otolaryngology as scheduled; in May she will have a repeat PET and see med/onc. I suspect Dr. Delton Coombes will present her at tumor board at that time.  She will see him for follow-up after that scan.  I will see her back on an as needed basis and continue to weigh in on her management at tumor boards.  She knows to call if she has any issues in the interim and I wished her the very best.  This encounter was provided by telemedicine platform by telephone as patient was unable to access MyChart video during pandemic precautions The patient has given verbal consent for this type of encounter and has been advised to only accept a meeting of this type in a secure network environment. The time spent during this encounter on date of service, in total, was 25 minutes. The attendants for this meeting include Eppie Gibson  and Gearldine Shown.  During the encounter, Eppie Gibson was located at [facility] Radiation Oncology Department.  Martiza Speth was located at home.   _____________________________________   Eppie Gibson, MD  This document serves as a record of services personally performed by Eppie Gibson, MD. It was created on her behalf by Wilburn Mylar, a trained medical scribe. The creation of this record is based on the scribe's personal observations and the provider's statements to them. This document has been checked and approved by the attending provider.

## 2019-11-23 ENCOUNTER — Encounter: Payer: Self-pay | Admitting: Radiation Oncology

## 2019-12-02 ENCOUNTER — Inpatient Hospital Stay (HOSPITAL_COMMUNITY): Payer: BC Managed Care – PPO

## 2019-12-02 NOTE — Progress Notes (Signed)
Nutrition Follow-up:  Patient with SCC of the tongue s/p right excision and right neck dissection on 8/7 at [facility].  Patient has completed radiation (10/30 and chemotherapy (last treatment held).   Spoke with patient via phone.  Reports that her appetite is about the same.  Has added soups, pudding back into diet.  Drinking about 5 ensure/boost plus shakes daily.     Medications: reviewed  Labs: reviewed  Anthropometrics:   Weight 153 lb 9.6 oz on 2/2 decrease from 156 lb 11.2 oz on 12/23  NUTRITION DIAGNOSIS: Inadequate oral intake improving   INTERVENTION:  Encouraged patient to continue 350 + calorie oral nutrition supplements.  5 per day providing 92% calorie needs and 76% of protein needs.   Encouraged continual intake of high calorie, high protein soft foods.  Patient has contact information and can reach out to RD at anytime   NEXT VISIT: no follow-up at this time Patient to call RD if needed  Yvonne Stopher B. Zenia Resides, [place], [place] Registered Dietitian [phone] (pager)

## 2019-12-05 ENCOUNTER — Other Ambulatory Visit (HOSPITAL_COMMUNITY): Payer: Self-pay | Admitting: *Deleted

## 2019-12-05 MED ORDER — FLUCONAZOLE 100 MG PO TABS: 100.0000 mg | ORAL_TABLET | Freq: Every day | ORAL | 0 refills | Status: DC

## 2019-12-26 ENCOUNTER — Telehealth: Payer: Self-pay | Admitting: *Deleted

## 2019-12-26 NOTE — Telephone Encounter (Signed)
On 12/26/19 faxed medical records to [facility] .

## 2020-01-20 IMAGING — CR DG CHEST 1V PORT
1 series · 1 of 1 positions shown · non-contrast
Comparison: None.

CLINICAL DATA: Port-A-Cath placement

EXAM:
PORTABLE CHEST 1 VIEW

[portable]
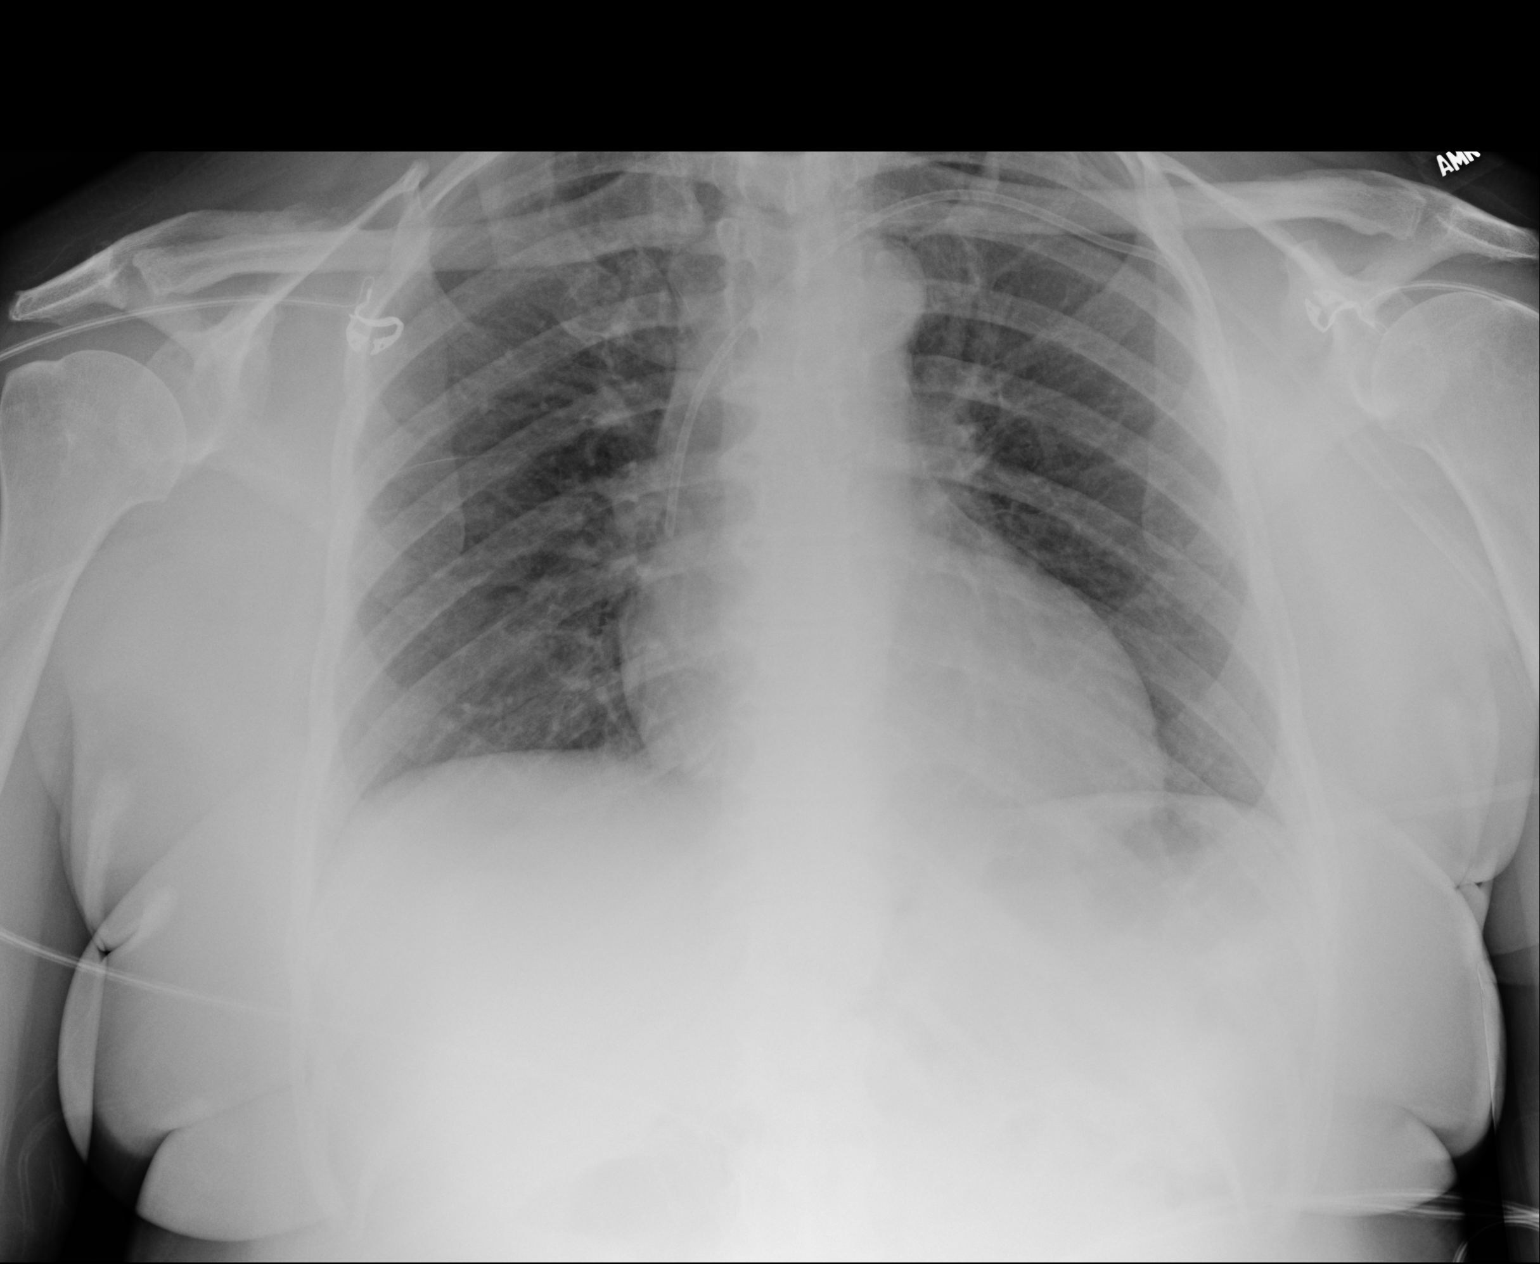

[1 of 1 positions shown; findings below may reference images not displayed]

FINDINGS: Left subclavian Port-A-Cath tip at the caval cavoatrial junction in
good position. No pneumothorax

Lungs are clear and well aerated.
IMPRESSION: Satisfactory Port-A-Cath placement.

## 2020-02-13 ENCOUNTER — Other Ambulatory Visit: Payer: Self-pay

## 2020-02-13 ENCOUNTER — Encounter (HOSPITAL_COMMUNITY)
Admission: RE | Admit: 2020-02-13 | Discharge: 2020-02-13 | Disposition: A | Discharge status: Home / Self Care | Payer: BC Managed Care – PPO | Source: Ambulatory Visit | Attending: Hematology | Admitting: Hematology

## 2020-02-13 DIAGNOSIS — C023 Malignant neoplasm of anterior two-thirds of tongue, part unspecified: Secondary | ICD-10-CM | POA: Insufficient documentation

## 2020-02-13 MED ORDER — FLUDEOXYGLUCOSE F - 18 (FDG) INJECTION
9.0200 | Freq: Once | INTRAVENOUS | Status: AC | PRN
  Administered 2020-02-13: 9.02 via INTRAVENOUS

## 2020-02-15 ENCOUNTER — Inpatient Hospital Stay (HOSPITAL_COMMUNITY): Payer: BC Managed Care – PPO | Attending: Hematology | Admitting: Hematology

## 2020-02-15 ENCOUNTER — Other Ambulatory Visit: Payer: Self-pay

## 2020-02-15 DIAGNOSIS — R911 Solitary pulmonary nodule: Secondary | ICD-10-CM | POA: Insufficient documentation

## 2020-02-15 DIAGNOSIS — I10 Essential (primary) hypertension: Secondary | ICD-10-CM | POA: Insufficient documentation

## 2020-02-15 DIAGNOSIS — R682 Dry mouth, unspecified: Secondary | ICD-10-CM | POA: Diagnosis not present

## 2020-02-15 DIAGNOSIS — R634 Abnormal weight loss: Secondary | ICD-10-CM | POA: Diagnosis not present

## 2020-02-15 DIAGNOSIS — Z79899 Other long term (current) drug therapy: Secondary | ICD-10-CM | POA: Insufficient documentation

## 2020-02-15 DIAGNOSIS — C023 Malignant neoplasm of anterior two-thirds of tongue, part unspecified: Secondary | ICD-10-CM | POA: Diagnosis present

## 2020-02-15 DIAGNOSIS — Z791 Long term (current) use of non-steroidal anti-inflammatories (NSAID): Secondary | ICD-10-CM | POA: Insufficient documentation

## 2020-02-15 DIAGNOSIS — B370 Candidal stomatitis: Secondary | ICD-10-CM | POA: Insufficient documentation

## 2020-02-15 MED ORDER — FLUCONAZOLE 100 MG PO TABS: 100.0000 mg | ORAL_TABLET | Freq: Every day | ORAL | 0 refills | Status: DC

## 2020-02-15 NOTE — Assessment & Plan Note (Addendum)
1.  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 Nicolette Bang at [facility]. -Pathology showed 4.2 cm tumor, margins negative, 17 mm depth of invasion, moderately to poorly differentiated gram-positive neural invasion, 2/17 lymph nodes positive, ECE positive, [id number]. -IMRT with weekly cisplatin from 06/27/2019 through 07/25/2019. -PET scan on 11/04/2019 showed hypermetabolic level 1B lymph node in the right submental space measuring 5 mm.  Left upper lobe pulmonary nodule measuring 8 mm. -We reviewed results of PET scan from 02/13/2020.  Ill-defined 9 mm short axis right level 1B node with SUV 8.6, previously 5.6.  2.1 cm short axis superior mediastinal/prevascular node, previously 8 mm SUV 13.2. -I have recommended biopsy of the left lung nodule/prevascular lymph node.  If this confirms squamous cell carcinoma, it will preclude her from having surgical resection of the submental lymph node.  Then we will recommend systemic therapy. -We will make a referral to pulmonology for possible navigational bronchoscopy and biopsy.  If not we will make referral to CT surgery. -I will also reach out to Dr. Isidore Moos to see if there is any role for SBRT to the nodes after biopsy if patient wants to delay systemic therapy. -I will see her back after the biopsy.  2.  Nutrition: -She is drinking about 6 cans of boost/Ensure per day.  She is not eating much of solid foods.  She is continuing to lose weight.  She does not have appetite and also cannot taste foods.  She has very dry mouth.  We will see if treatment of thrush will improve her eating of solid foods.  3.  Oral thrush: -She has recurrent thrush on the tongue.  I will give her 2-week treatment with Diflucan.

## 2020-02-15 NOTE — Patient Instructions (Signed)
[place] at [facility] Discharge Instructions  You were seen today by Dr. Delton Coombes. He went over your recent scan results. Your scan showed that there is a new area in your lung, Dr. Delton Coombes recommends that you have a biopsy of the area to determine what you have going on. He will also discuss your case with Dr. Isidore Moos to see if radiation is needed. He will send in a new prescription for Diflucan to your pharmacy for thrush.  He will see you back after your biopsy for follow up.   Thank you for choosing [place] at [facility] to provide your oncology and hematology care.  To afford each patient quality time with our provider, please arrive at least 15 minutes before your scheduled appointment time.   If you have a lab appointment with the [facility] please come in thru the  Main Entrance and check in at the main information desk  You need to re-schedule your appointment should you arrive 10 or more minutes late.  We strive to give you quality time with our providers, and arriving late affects you and other patients whose appointments are after yours.  Also, if you no show three or more times for appointments you may be dismissed from the clinic at the providers discretion.     Again, thank you for choosing [facility].  Our hope is that these requests will decrease the amount of time that you wait before being seen by our physicians.       _____________________________________________________________  Should you have questions after your visit to [facility] please contact our office at [phone] between the hours of 8:00 a.m. and 4:30 p.m.  Voicemails left after 4:00 p.m. will not be returned until the following business day.  For prescription refill requests, have your pharmacy contact our office and allow 72 hours.    Cancer Center Support Programs:   > Cancer Support Group  2nd Tuesday of the month  1pm-2pm, Journey Room

## 2020-02-15 NOTE — Progress Notes (Signed)
[name], [name] [address]   CLINIC:  Medical Oncology/Hematology  PCP:  Terri Naegeli, DO [address] [name] [address] [phone]   REASON FOR VISIT:  Follow-up for right tongue cancer.    INTERVAL HISTORY:  [name] 69 y.o. female seen for follow-up of right lateral tongue cancer.  Reports drinking Ensure/boost about 6 cans/day.  Denies any new onset pains.  Appetite is 25%.  Energy levels are 75%.  Denies any tingling or numbness in the extremities.  REVIEW OF SYSTEMS:  Review of Systems  All other systems reviewed and are negative.    PAST MEDICAL/SURGICAL HISTORY:  Past Medical History:  Diagnosis Date  . History of radiation therapy 06/27/19- 08/05/19   HN tongue, 30 fractions of 2 Gy each to total 60 Gy.   [name] Hypertension   . Port-A-Cath in place 06/14/2019   Past Surgical History:  Procedure Laterality Date  . 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 Dr. Nicolette Bang at [facility]  . PORTACATH PLACEMENT Left 06/10/2019   Procedure: INSERTION PORT-A-CATH;  Surgeon: Aviva Signs, MD;  Location: [facility];  Service: General;  Laterality: Left;  pt knows to arrive at 6:15  . TUBAL LIGATION       SOCIAL HISTORY:  Social History   Socioeconomic History  . Marital status: Widowed    Spouse name: Not on file  . Number of children: 2  . Years of education: Not on file  . Highest education level: Not on file  Occupational History  . Not on file  Tobacco Use  . Smoking status: Never Smoker  . Smokeless tobacco: Never Used  Substance and Sexual Activity  . Alcohol use: Never  . Drug use: Never  . Sexual activity: Not on file  Other Topics Concern  . Not on file  Social History Narrative      Patient is a widow.   Patient lives with her 2 sons in [place], [place].   Patient is a non-smoker, nondrinker.  Patient has  never used chew.  Patient does not use illicit drugs.   Social Determinants of Health   Financial Resource Strain: Low Risk   . Difficulty of Paying Living Expenses: Not hard at all  Food Insecurity: No Food Insecurity  . Worried About Charity fundraiser in the Last Year: Never true  . Ran Out of Food in the Last Year: Never true  Transportation Needs: No Transportation Needs  . Lack of Transportation (Medical): No  . Lack of Transportation (Non-Medical): No  Physical Activity: Inactive  . Days of Exercise per Week: 0 days  . Minutes of Exercise per Session: 0 min  Stress: No Stress Concern Present  . Feeling of Stress : Only a little  Social Connections: Slightly Isolated  . Frequency of Communication with Friends and Family: More than three times a week  . Frequency of Social Gatherings with Friends and Family: More than three times a week  . Attends Religious Services: More than 4 times per year  . Active Member of Clubs or Organizations: Yes  . Attends Archivist Meetings: More than 4 times per year  . Marital Status: Widowed  Intimate Partner Violence: Not At Risk  . Fear of Current or Ex-Partner: No  . Emotionally Abused: No  . Physically Abused: No  . Sexually Abused: No  FAMILY HISTORY:  Family History  Problem Relation Age of Onset  . Heart failure Mother   . Heart failure Father     CURRENT MEDICATIONS:  Outpatient Encounter Medications as of 02/15/2020  Medication Sig  . amLODipine (NORVASC) 5 MG tablet Take 5 mg by mouth daily.  [name] CISPLATIN IV Inject into the vein once a week.  . DENTA 5000 PLUS 1.1 % CREA dental cream BRUSH TEETH FOR 2 MINUTES THEN SPIT OUT EXCESS, DO NOT SWALLOW OR RINSE AFTERWARDS, NOT COVER  . dronabinol (MARINOL) 5 MG capsule Take 1 capsule (5 mg total) by mouth 2 (two) times daily before a meal.  . fluconazole (DIFLUCAN) 100 MG tablet Take 1 tablet (100 mg total) by mouth daily.  . Multiple Vitamins-Minerals (MULTIVITAMIN  WITH MINERALS) tablet Take 1 tablet by mouth daily.  [name] omeprazole (PRILOSEC) 40 MG capsule Take 40 mg by mouth daily.   [name] acetaminophen (TYLENOL) 500 MG tablet Take 500 mg by mouth every 8 (eight) hours as needed (pain).   . chlorhexidine (PERIDEX) 0.12 % solution Use as directed 15 mLs in the mouth or throat 2 (two) times daily. (Patient not taking: Reported on 02/15/2020)  . diclofenac (VOLTAREN) 75 MG EC tablet Take 75 mg by mouth 2 (two) times daily as needed (pain).   . fluconazole (DIFLUCAN) 100 MG tablet Take 1 tablet (100 mg total) by mouth daily. Take 2 tablets on day 1 then take 1 tablet everyday for 13 days  . HYDROcodone-acetaminophen (NORCO) 5-325 MG tablet Take 1 tablet by mouth every 4 (four) hours as needed for moderate pain. (Patient not taking: Reported on 02/15/2020)  . lidocaine (XYLOCAINE) 2 % solution Patient: Mix 1part 2% viscous lidocaine, 1part H20. Swish & swallow 51mL of diluted mixture, 78min before meals and at bedtime, up to QID. May otherwise swish and spit up to 8 times daily. (Patient not taking: Reported on 02/15/2020)  . lidocaine-prilocaine (EMLA) cream Apply small amount to port a cath site and cover with plastic wrap one hour prior to chemotherapy appointments. (Patient not taking: Reported on 02/15/2020)  . prochlorperazine (COMPAZINE) 10 MG tablet Take 1 tablet (10 mg total) by mouth every 6 (six) hours as needed (Nausea or vomiting). (Patient not taking: Reported on 02/15/2020)   Facility-Administered Encounter Medications as of 02/15/2020  Medication  . heparin lock flush 100 unit/mL  . sodium chloride 0.9 % 1,000 mL with potassium chloride 20 mEq, magnesium sulfate 2 g infusion  . sodium chloride flush (NS) 0.9 % injection 10 mL    ALLERGIES:  No Known Allergies   PHYSICAL EXAM:  ECOG Performance status: 0  Vitals:   02/15/20 1435  BP: 133/81  Pulse: (!) 56  Resp: 17  Temp: (!) 96.4 F (35.8 C)  SpO2: 96%   Filed Weights   02/15/20 1435    Weight: 145 lb 6.4 oz (66 kg)    Physical Exam Vitals reviewed.  Constitutional:      Appearance: Normal appearance.  HENT:     Mouth/Throat:     Comments: Thrush on the tongue presented Cardiovascular:     Rate and Rhythm: Normal rate and regular rhythm.     Heart sounds: Normal heart sounds.  Pulmonary:     Effort: Pulmonary effort is normal.     Breath sounds: Normal breath sounds.  Abdominal:     General: There is no distension.     Palpations: Abdomen is soft. There is no mass.  Musculoskeletal:  General: No swelling.  Lymphadenopathy:     Cervical: No cervical adenopathy.  Skin:    General: Skin is warm.  Neurological:     General: No focal deficit present.     Mental Status: She is alert and oriented to person, place, and time.  Psychiatric:        Mood and Affect: Mood normal.        Behavior: Behavior normal.   No clearly palpable neck lymphadenopathy although skin is thickened.   LABORATORY DATA:  I have reviewed the labs as listed.  CBC    Component Value Date/Time   WBC 2.6 (L) 11/07/2019 1403   RBC 3.70 (L) 11/07/2019 1403   HGB 11.4 (L) 11/07/2019 1403   HCT 35.3 (L) 11/07/2019 1403   PLT 147 (L) 11/07/2019 1403   MCV 95.4 11/07/2019 1403   MCH 30.8 11/07/2019 1403   MCHC 32.3 11/07/2019 1403   RDW 12.9 11/07/2019 1403   LYMPHSABS 0.3 (L) 11/07/2019 1403   MONOABS 0.3 11/07/2019 1403   EOSABS 0.1 11/07/2019 1403   BASOSABS 0.0 11/07/2019 1403   CMP Latest Ref Rng & Units 11/07/2019 09/28/2019 08/24/2019  Glucose 70 - 99 mg/dL 99 105(H) 96  BUN 8 - 23 mg/dL 16 17 21   Creatinine 0.44 - 1.00 mg/dL 0.95 1.14(H) 1.29(H)  Sodium 135 - 145 mmol/L 135 134(L) 135  Potassium 3.5 - 5.1 mmol/L 3.9 3.9 3.6  Chloride 98 - 111 mmol/L 102 97(L) 92(L)  CO2 22 - 32 mmol/L 25 28 30   Calcium 8.9 - 10.3 mg/dL 9.2 9.5 10.0  Total Protein 6.5 - 8.1 g/dL 6.8 7.3 7.4  Total Bilirubin 0.3 - 1.2 mg/dL 0.6 0.6 0.6  Alkaline Phos 38 - 126 U/L 48 48 53  AST 15  - 41 U/L 20 22 23   ALT 0 - 44 U/L 15 19 24        DIAGNOSTIC IMAGING:  I have independently reviewed scans with the patient.   I have reviewed Venita Lick LPN's note and agree with the documentation.  I personally performed a face-to-face visit, made revisions and my assessment and plan is as follows.    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 Nicolette Bang at [facility]. -Pathology showed 4.2 cm tumor, margins negative, 17 mm depth of invasion, moderately to poorly differentiated gram-positive neural invasion, 2/17 lymph nodes positive, ECE positive, PT4APN3B. -IMRT with weekly cisplatin from 06/27/2019 through 07/25/2019. -PET scan on 11/04/2019 showed hypermetabolic level 1B lymph node in the right submental space measuring 5 mm.  Left upper lobe pulmonary nodule measuring 8 mm. -We reviewed results of PET scan from 02/13/2020.  Ill-defined 9 mm short axis right level 1B node with SUV 8.6, previously 5.6.  2.1 cm short axis superior mediastinal/prevascular node, previously 8 mm SUV 13.2. -I have recommended biopsy of the left lung nodule/prevascular lymph node.  If this confirms squamous cell carcinoma, it will preclude her from having surgical resection of the submental lymph node.  Then we will recommend systemic therapy. -We will make a referral to pulmonology for possible navigational bronchoscopy and biopsy.  If not we will make referral to CT surgery. -I will also reach out to Dr. Isidore Moos to see if there is any role for SBRT to the nodes after biopsy if patient wants to delay  systemic therapy. -I will see her back after the biopsy.  2.  Nutrition: -She is drinking about 6 cans of boost/Ensure per day.  She is not eating much of solid foods.  She is continuing to lose weight.  She  does not have appetite and also cannot taste foods.  She has very dry mouth.  We will see if treatment of thrush will improve her eating of solid foods.  3.  Oral thrush: -She has recurrent thrush on the tongue.  I will give her 2-week treatment with Diflucan.      Orders placed this encounter:  No orders of the defined types were placed in this encounter.  Total time spent is 40 minutes with more than 50% of the time spent face-to-face discussing and reviewing scans, treatment options, counseling and coordination of care.   Derek Jack, MD [place] [phone]

## 2020-02-22 ENCOUNTER — Other Ambulatory Visit: Payer: Self-pay

## 2020-02-22 ENCOUNTER — Encounter: Payer: Self-pay | Admitting: Internal Medicine

## 2020-02-22 ENCOUNTER — Ambulatory Visit: Payer: BC Managed Care – PPO | Admitting: Internal Medicine

## 2020-02-22 VITALS — BP 118/62 | HR 93 | Temp 98.2°F | Ht 65.0 in | Wt 143.0 lb

## 2020-02-22 DIAGNOSIS — R918 Other nonspecific abnormal finding of lung field: Secondary | ICD-10-CM

## 2020-02-22 NOTE — H&P (View-Only) (Signed)
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  Also odd that patient is nonsmoker getting squamous cell of tongue.   Regardless needs tissue for diagnosis.  Discussed case with Dr. Valeta Harms who thinks that there is chance this can be reach by bronchoscopy.  Otherwise would probably need Chamberlain procedure. 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 Delton Coombes.  1b PET-avid node to consider resection depending on path from mediastinal node.  - CT Chest Super-D protocol - Bronch/ENB with Dr. Lamonte Sakai 03/06/20 - If bronch nondiagnostic needs referral to [facility]  MDM . I reviewed prior external note(s) from Dr. Delton Coombes on 02/15/20 . I reviewed the result(s) of path from neck dissection 05/13/19 with stage pT4a, pN3b disease . I have ordered CT chest and bronchoscopy  Review of patient's PET/CT images revealed solitary PET-avid prevascular node just anterior to PA trunk.   Otherwise has small additional node in right neck. The patient's images have been independently reviewed by me.    Discussion of management or test interpretation with another colleague Dr. Valeta Harms.    End of visit medications:  Current Outpatient Medications:  .  amLODipine (NORVASC) 5 MG tablet, Take 5 mg by mouth daily., Disp: , Rfl:  .  chlorhexidine (PERIDEX) 0.12 % solution, Use as directed 15 mLs in the mouth or throat 2 (two) times daily., Disp: 120 mL, Rfl: 2 .  CISPLATIN IV, Inject into the vein once a week., Disp: , Rfl:  .  DENTA 5000 PLUS 1.1 % CREA dental cream, BRUSH TEETH FOR 2 MINUTES THEN SPIT OUT EXCESS, DO NOT SWALLOW OR RINSE AFTERWARDS, NOT COVER, Disp: , Rfl:  .  HYDROcodone-acetaminophen (NORCO) 5-325 MG tablet, Take 1 tablet by mouth every 4 (four) hours as needed for moderate pain., Disp: 20 tablet, Rfl: 0 .  Multiple  Vitamins-Minerals (MULTIVITAMIN WITH MINERALS) tablet, Take 1 tablet by mouth daily., Disp: , Rfl:  .  prochlorperazine (COMPAZINE) 10 MG tablet, Take 1 tablet (10 mg total) by mouth every 6 (six) hours as needed (Nausea or vomiting)., Disp: 30 tablet, Rfl: 1 .  acetaminophen (TYLENOL) 500 MG tablet, Take 500 mg by mouth every 8 (eight) hours as needed (pain). , Disp: , Rfl:  .  diclofenac (VOLTAREN) 75 MG EC tablet, Take 75 mg by mouth 2 (two) times daily as needed (pain). , Disp: , Rfl:  .  dronabinol (MARINOL) 5 MG capsule, Take 1 capsule (5 mg total) by mouth 2 (two) times daily before a meal. (Patient not taking: Reported on 02/22/2020), Disp: 30 capsule, Rfl: 0 .  fluconazole (DIFLUCAN) 100 MG tablet, Take 1 tablet (100 mg total) by mouth daily. (Patient not taking: Reported on 02/22/2020), Disp: 10 tablet, Rfl: 0 .  fluconazole (DIFLUCAN) 100 MG tablet, Take 1 tablet (100 mg total) by mouth daily. Take 2 tablets on day 1 then take 1 tablet everyday for 13 days (Patient not taking: Reported on 02/22/2020), Disp: 15 tablet, Rfl: 0 .  lidocaine (XYLOCAINE) 2 % solution, Patient: Mix 1part 2% viscous lidocaine, 1part H20. Swish & swallow 46mL of diluted mixture, 67min before meals and at bedtime, up to QID. May otherwise swish and spit up to 8 times daily. (Patient not taking: Reported on 02/22/2020), Disp: 100 mL, Rfl: 5 .  lidocaine-prilocaine (EMLA) cream, Apply small amount to port a cath site and cover with plastic  wrap one hour prior to chemotherapy appointments. (Patient not taking: Reported on 02/22/2020), Disp: 30 g, Rfl: 3 .  omeprazole (PRILOSEC) 40 MG capsule, Take 40 mg by mouth daily. , Disp: , Rfl:  No current facility-administered medications for this visit.  Facility-Administered Medications Ordered in Other Visits:  .  heparin lock flush 100 unit/mL, 500 Units, Intracatheter, Once PRN, Derek Jack, MD .  sodium chloride 0.9 % 1,000 mL with potassium chloride 20 mEq, magnesium  sulfate 2 g infusion, , Intravenous, Once, Derek Jack, MD .  sodium chloride flush (NS) 0.9 % injection 10 mL, 10 mL, Intracatheter, Once PRN, Derek Jack, MD   Candee Furbish, MD [facility] 02/22/2020 4:14 PM    Subjective:   PATIENT ID: Terri [name] GENDER: female DOB: 12-18-1950, MRN: [id number]  Chief Complaint  Patient presents with  . Consult    review scans     HPI Here for abnormal imaging found during restaging scans for head and neck cancer. Was in usual state of health until early 2020 when developed tongue pain. Eventually diagnosed with stage 4b squamous cell cancer of tongue. Post right partial glossectomy and neck dissection on 05/13/2019 Chemoradiation 9/21-10/19/2020 PET scan showing level 1b node as well as prevascular node. Prevascular node needs sampling prior to considering excision of neck node  Nonsmoker, no unusual exposures.  Ancillary information including prior medications, full medical/surgical/family/social histoies, and PFTs (when available) are listed below and have been reviewed.   ROS + symptoms in bold Fevers, chills, weight loss Nausea, vomiting, diarrhea Shortness of breath, wheezing, cough Chest pain, palpitations, lower ext edema   Objective:   Vitals:   02/22/20 1329  BP: 118/62  Pulse: 93  Temp: 98.2 F (36.8 C)  TempSrc: Temporal  SpO2: 99%  Weight: 143 lb (64.9 kg)  Height: 5\' 5"  (1.651 m)   99% on  RA BMI Readings from Last 3 Encounters:  02/22/20 23.80 kg/m  02/15/20 24.20 kg/m  11/08/19 25.56 kg/m   Wt Readings from Last 3 Encounters:  02/22/20 143 lb (64.9 kg)  02/15/20 145 lb 6.4 oz (66 kg)  11/08/19 153 lb 9.6 oz (69.7 kg)    GEN: thin woman in no acute distress HEENT: mask in place, trachea midline CV: Regular rate and rhythm, extremities are warm PULM: clear, no wheezing GI: Soft, +BS EXT: no edema or clubbing NEURO: Moves all 4 extremities   Ancillary  Information    Past Medical History:  Diagnosis Date  . History of radiation therapy 06/27/19- 08/05/19   HN tongue, 30 fractions of 2 Gy each to total 60 Gy.   [name] Hypertension   . Port-A-Cath in place 06/14/2019     Family History  Problem Relation Age of Onset  . Heart failure Mother   . Heart failure Father      Past Surgical History:  Procedure Laterality Date  . 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 Dr. Nicolette Bang at [facility]  . PORTACATH PLACEMENT Left 06/10/2019   Procedure: INSERTION PORT-A-CATH;  Surgeon: Aviva Signs, MD;  Location: [facility];  Service: General;  Laterality: Left;  pt knows to arrive at 6:15  . TUBAL LIGATION      Social History   Socioeconomic History  . Marital status: Widowed    Spouse name: Not on file  . Number of children: 2  . Years of education: Not on file  . Highest education  level: Not on file  Occupational History  . Not on file  Tobacco Use  . Smoking status: Never Smoker  . Smokeless tobacco: Never Used  Substance and Sexual Activity  . Alcohol use: Never  . Drug use: Never  . Sexual activity: Not on file  Other Topics Concern  . Not on file  Social History Narrative      Patient is a widow.   Patient lives with her 2 sons in [place], [place].   Patient is a non-smoker, nondrinker.  Patient has never used chew.  Patient does not use illicit drugs.   Social Determinants of Health   Financial Resource Strain: Low Risk   . Difficulty of Paying Living Expenses: Not hard at all  Food Insecurity: No Food Insecurity  . Worried About Charity fundraiser in the Last Year: Never true  . Ran Out of Food in the Last Year: Never true  Transportation Needs: No Transportation Needs  . Lack of Transportation (Medical): No  . Lack of Transportation (Non-Medical): No  Physical Activity: Inactive  . Days of Exercise per Week: 0 days  . Minutes of Exercise  per Session: 0 min  Stress: No Stress Concern Present  . Feeling of Stress : Only a little  Social Connections: Slightly Isolated  . Frequency of Communication with Friends and Family: More than three times a week  . Frequency of Social Gatherings with Friends and Family: More than three times a week  . Attends Religious Services: More than 4 times per year  . Active Member of Clubs or Organizations: Yes  . Attends Archivist Meetings: More than 4 times per year  . Marital Status: Widowed  Intimate Partner Violence: Not At Risk  . Fear of Current or Ex-Partner: No  . Emotionally Abused: No  . Physically Abused: No  . Sexually Abused: No     No Known Allergies   CBC    Component Value Date/Time   WBC 2.6 (L) 11/07/2019 1403   RBC 3.70 (L) 11/07/2019 1403   HGB 11.4 (L) 11/07/2019 1403   HCT 35.3 (L) 11/07/2019 1403   PLT 147 (L) 11/07/2019 1403   MCV 95.4 11/07/2019 1403   MCH 30.8 11/07/2019 1403   MCHC 32.3 11/07/2019 1403   RDW 12.9 11/07/2019 1403   LYMPHSABS 0.3 (L) 11/07/2019 1403   MONOABS 0.3 11/07/2019 1403   EOSABS 0.1 11/07/2019 1403   BASOSABS 0.0 11/07/2019 1403    Pulmonary Functions Testing Results: No flowsheet data found.  Outpatient Medications Prior to Visit  Medication Sig Dispense Refill  . amLODipine (NORVASC) 5 MG tablet Take 5 mg by mouth daily.    . chlorhexidine (PERIDEX) 0.12 % solution Use as directed 15 mLs in the mouth or throat 2 (two) times daily. 120 mL 2  . CISPLATIN IV Inject into the vein once a week.    . DENTA 5000 PLUS 1.1 % CREA dental cream BRUSH TEETH FOR 2 MINUTES THEN SPIT OUT EXCESS, DO NOT SWALLOW OR RINSE AFTERWARDS, NOT COVER    . HYDROcodone-acetaminophen (NORCO) 5-325 MG tablet Take 1 tablet by mouth every 4 (four) hours as needed for moderate pain. 20 tablet 0  . Multiple Vitamins-Minerals (MULTIVITAMIN WITH MINERALS) tablet Take 1 tablet by mouth daily.    . prochlorperazine (COMPAZINE) 10 MG tablet Take 1  tablet (10 mg total) by mouth every 6 (six) hours as needed (Nausea or vomiting). 30 tablet 1  . acetaminophen (TYLENOL) 500 MG tablet Take  500 mg by mouth every 8 (eight) hours as needed (pain).     [name] diclofenac (VOLTAREN) 75 MG EC tablet Take 75 mg by mouth 2 (two) times daily as needed (pain).     [name] dronabinol (MARINOL) 5 MG capsule Take 1 capsule (5 mg total) by mouth 2 (two) times daily before a meal. (Patient not taking: Reported on 02/22/2020) 30 capsule 0  . fluconazole (DIFLUCAN) 100 MG tablet Take 1 tablet (100 mg total) by mouth daily. (Patient not taking: Reported on 02/22/2020) 10 tablet 0  . fluconazole (DIFLUCAN) 100 MG tablet Take 1 tablet (100 mg total) by mouth daily. Take 2 tablets on day 1 then take 1 tablet everyday for 13 days (Patient not taking: Reported on 02/22/2020) 15 tablet 0  . lidocaine (XYLOCAINE) 2 % solution Patient: Mix 1part 2% viscous lidocaine, 1part H20. Swish & swallow 26mL of diluted mixture, 75min before meals and at bedtime, up to QID. May otherwise swish and spit up to 8 times daily. (Patient not taking: Reported on 02/22/2020) 100 mL 5  . lidocaine-prilocaine (EMLA) cream Apply small amount to port a cath site and cover with plastic wrap one hour prior to chemotherapy appointments. (Patient not taking: Reported on 02/22/2020) 30 g 3  . omeprazole (PRILOSEC) 40 MG capsule Take 40 mg by mouth daily.      Facility-Administered Medications Prior to Visit  Medication Dose Route Frequency Provider Last Rate Last Admin  . heparin lock flush 100 unit/mL  500 Units Intracatheter Once PRN Derek Jack, MD      . sodium chloride 0.9 % 1,000 mL with potassium chloride 20 mEq, magnesium sulfate 2 g infusion   Intravenous Once Derek Jack, MD      . sodium chloride flush (NS) 0.9 % injection 10 mL  10 mL Intracatheter Once PRN Derek Jack, MD

## 2020-02-22 NOTE — Progress Notes (Signed)
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  Also odd that patient is nonsmoker getting squamous cell of tongue.   Regardless needs tissue for diagnosis.  Discussed case with Dr. Valeta Harms who thinks that there is chance this can be reach by bronchoscopy.  Otherwise would probably need Chamberlain procedure. 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 Delton Coombes.  1b PET-avid node to consider resection depending on path from mediastinal node.  - CT Chest Super-D protocol - Bronch/ENB with Dr. Lamonte Sakai 03/06/20 - If bronch nondiagnostic needs referral to [facility]  MDM . I reviewed prior external note(s) from Dr. Delton Coombes on 02/15/20 . I reviewed the result(s) of path from neck dissection 05/13/19 with stage pT4a, pN3b disease . I have ordered CT chest and bronchoscopy  Review of patient's PET/CT images revealed solitary PET-avid prevascular node just anterior to PA trunk.   Otherwise has small additional node in right neck. The patient's images have been independently reviewed by me.    Discussion of management or test interpretation with another colleague Dr. Valeta Harms.    End of visit medications:  Current Outpatient Medications:  .  amLODipine (NORVASC) 5 MG tablet, Take 5 mg by mouth daily., Disp: , Rfl:  .  chlorhexidine (PERIDEX) 0.12 % solution, Use as directed 15 mLs in the mouth or throat 2 (two) times daily., Disp: 120 mL, Rfl: 2 .  CISPLATIN IV, Inject into the vein once a week., Disp: , Rfl:  .  DENTA 5000 PLUS 1.1 % CREA dental cream, BRUSH TEETH FOR 2 MINUTES THEN SPIT OUT EXCESS, DO NOT SWALLOW OR RINSE AFTERWARDS, NOT COVER, Disp: , Rfl:  .  HYDROcodone-acetaminophen (NORCO) 5-325 MG tablet, Take 1 tablet by mouth every 4 (four) hours as needed for moderate pain., Disp: 20 tablet, Rfl: 0 .  Multiple  Vitamins-Minerals (MULTIVITAMIN WITH MINERALS) tablet, Take 1 tablet by mouth daily., Disp: , Rfl:  .  prochlorperazine (COMPAZINE) 10 MG tablet, Take 1 tablet (10 mg total) by mouth every 6 (six) hours as needed (Nausea or vomiting)., Disp: 30 tablet, Rfl: 1 .  acetaminophen (TYLENOL) 500 MG tablet, Take 500 mg by mouth every 8 (eight) hours as needed (pain). , Disp: , Rfl:  .  diclofenac (VOLTAREN) 75 MG EC tablet, Take 75 mg by mouth 2 (two) times daily as needed (pain). , Disp: , Rfl:  .  dronabinol (MARINOL) 5 MG capsule, Take 1 capsule (5 mg total) by mouth 2 (two) times daily before a meal. (Patient not taking: Reported on 02/22/2020), Disp: 30 capsule, Rfl: 0 .  fluconazole (DIFLUCAN) 100 MG tablet, Take 1 tablet (100 mg total) by mouth daily. (Patient not taking: Reported on 02/22/2020), Disp: 10 tablet, Rfl: 0 .  fluconazole (DIFLUCAN) 100 MG tablet, Take 1 tablet (100 mg total) by mouth daily. Take 2 tablets on day 1 then take 1 tablet everyday for 13 days (Patient not taking: Reported on 02/22/2020), Disp: 15 tablet, Rfl: 0 .  lidocaine (XYLOCAINE) 2 % solution, Patient: Mix 1part 2% viscous lidocaine, 1part H20. Swish & swallow 23mL of diluted mixture, 75min before meals and at bedtime, up to QID. May otherwise swish and spit up to 8 times daily. (Patient not taking: Reported on 02/22/2020), Disp: 100 mL, Rfl: 5 .  lidocaine-prilocaine (EMLA) cream, Apply small amount to port a cath site and cover with plastic  wrap one hour prior to chemotherapy appointments. (Patient not taking: Reported on 02/22/2020), Disp: 30 g, Rfl: 3 .  omeprazole (PRILOSEC) 40 MG capsule, Take 40 mg by mouth daily. , Disp: , Rfl:  No current facility-administered medications for this visit.  Facility-Administered Medications Ordered in Other Visits:  .  heparin lock flush 100 unit/mL, 500 Units, Intracatheter, Once PRN, Derek Jack, MD .  sodium chloride 0.9 % 1,000 mL with potassium chloride 20 mEq, magnesium  sulfate 2 g infusion, , Intravenous, Once, Derek Jack, MD .  sodium chloride flush (NS) 0.9 % injection 10 mL, 10 mL, Intracatheter, Once PRN, Derek Jack, MD   Candee Furbish, MD [facility] 02/22/2020 4:14 PM    Subjective:   PATIENT ID: Terri [name] GENDER: female DOB: Jan 25, 1951, MRN: [id number]  Chief Complaint  Patient presents with  . Consult    review scans     HPI Here for abnormal imaging found during restaging scans for head and neck cancer. Was in usual state of health until early 2020 when developed tongue pain. Eventually diagnosed with stage 4b squamous cell cancer of tongue. Post right partial glossectomy and neck dissection on 05/13/2019 Chemoradiation 9/21-10/19/2020 PET scan showing level 1b node as well as prevascular node. Prevascular node needs sampling prior to considering excision of neck node  Nonsmoker, no unusual exposures.  Ancillary information including prior medications, full medical/surgical/family/social histoies, and PFTs (when available) are listed below and have been reviewed.   ROS + symptoms in bold Fevers, chills, weight loss Nausea, vomiting, diarrhea Shortness of breath, wheezing, cough Chest pain, palpitations, lower ext edema   Objective:   Vitals:   02/22/20 1329  BP: 118/62  Pulse: 93  Temp: 98.2 F (36.8 C)  TempSrc: Temporal  SpO2: 99%  Weight: 143 lb (64.9 kg)  Height: 5\' 5"  (1.651 m)   99% on  RA BMI Readings from Last 3 Encounters:  02/22/20 23.80 kg/m  02/15/20 24.20 kg/m  11/08/19 25.56 kg/m   Wt Readings from Last 3 Encounters:  02/22/20 143 lb (64.9 kg)  02/15/20 145 lb 6.4 oz (66 kg)  11/08/19 153 lb 9.6 oz (69.7 kg)    GEN: thin woman in no acute distress HEENT: mask in place, trachea midline CV: Regular rate and rhythm, extremities are warm PULM: clear, no wheezing GI: Soft, +BS EXT: no edema or clubbing NEURO: Moves all 4 extremities   Ancillary  Information    Past Medical History:  Diagnosis Date  . History of radiation therapy 06/27/19- 08/05/19   HN tongue, 30 fractions of 2 Gy each to total 60 Gy.   [name] Hypertension   . Port-A-Cath in place 06/14/2019     Family History  Problem Relation Age of Onset  . Heart failure Mother   . Heart failure Father      Past Surgical History:  Procedure Laterality Date  . 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 Dr. Nicolette Bang at [facility]  . PORTACATH PLACEMENT Left 06/10/2019   Procedure: INSERTION PORT-A-CATH;  Surgeon: Aviva Signs, MD;  Location: [facility];  Service: General;  Laterality: Left;  pt knows to arrive at 6:15  . TUBAL LIGATION      Social History   Socioeconomic History  . Marital status: Widowed    Spouse name: Not on file  . Number of children: 2  . Years of education: Not on file  . Highest education  level: Not on file  Occupational History  . Not on file  Tobacco Use  . Smoking status: Never Smoker  . Smokeless tobacco: Never Used  Substance and Sexual Activity  . Alcohol use: Never  . Drug use: Never  . Sexual activity: Not on file  Other Topics Concern  . Not on file  Social History Narrative      Patient is a widow.   Patient lives with her 2 sons in [place], [place].   Patient is a non-smoker, nondrinker.  Patient has never used chew.  Patient does not use illicit drugs.   Social Determinants of Health   Financial Resource Strain: Low Risk   . Difficulty of Paying Living Expenses: Not hard at all  Food Insecurity: No Food Insecurity  . Worried About Charity fundraiser in the Last Year: Never true  . Ran Out of Food in the Last Year: Never true  Transportation Needs: No Transportation Needs  . Lack of Transportation (Medical): No  . Lack of Transportation (Non-Medical): No  Physical Activity: Inactive  . Days of Exercise per Week: 0 days  . Minutes of Exercise  per Session: 0 min  Stress: No Stress Concern Present  . Feeling of Stress : Only a little  Social Connections: Slightly Isolated  . Frequency of Communication with Friends and Family: More than three times a week  . Frequency of Social Gatherings with Friends and Family: More than three times a week  . Attends Religious Services: More than 4 times per year  . Active Member of Clubs or Organizations: Yes  . Attends Archivist Meetings: More than 4 times per year  . Marital Status: Widowed  Intimate Partner Violence: Not At Risk  . Fear of Current or Ex-Partner: No  . Emotionally Abused: No  . Physically Abused: No  . Sexually Abused: No     No Known Allergies   CBC    Component Value Date/Time   WBC 2.6 (L) 11/07/2019 1403   RBC 3.70 (L) 11/07/2019 1403   HGB 11.4 (L) 11/07/2019 1403   HCT 35.3 (L) 11/07/2019 1403   PLT 147 (L) 11/07/2019 1403   MCV 95.4 11/07/2019 1403   MCH 30.8 11/07/2019 1403   MCHC 32.3 11/07/2019 1403   RDW 12.9 11/07/2019 1403   LYMPHSABS 0.3 (L) 11/07/2019 1403   MONOABS 0.3 11/07/2019 1403   EOSABS 0.1 11/07/2019 1403   BASOSABS 0.0 11/07/2019 1403    Pulmonary Functions Testing Results: No flowsheet data found.  Outpatient Medications Prior to Visit  Medication Sig Dispense Refill  . amLODipine (NORVASC) 5 MG tablet Take 5 mg by mouth daily.    . chlorhexidine (PERIDEX) 0.12 % solution Use as directed 15 mLs in the mouth or throat 2 (two) times daily. 120 mL 2  . CISPLATIN IV Inject into the vein once a week.    . DENTA 5000 PLUS 1.1 % CREA dental cream BRUSH TEETH FOR 2 MINUTES THEN SPIT OUT EXCESS, DO NOT SWALLOW OR RINSE AFTERWARDS, NOT COVER    . HYDROcodone-acetaminophen (NORCO) 5-325 MG tablet Take 1 tablet by mouth every 4 (four) hours as needed for moderate pain. 20 tablet 0  . Multiple Vitamins-Minerals (MULTIVITAMIN WITH MINERALS) tablet Take 1 tablet by mouth daily.    . prochlorperazine (COMPAZINE) 10 MG tablet Take 1  tablet (10 mg total) by mouth every 6 (six) hours as needed (Nausea or vomiting). 30 tablet 1  . acetaminophen (TYLENOL) 500 MG tablet Take  500 mg by mouth every 8 (eight) hours as needed (pain).     [name] diclofenac (VOLTAREN) 75 MG EC tablet Take 75 mg by mouth 2 (two) times daily as needed (pain).     [name] dronabinol (MARINOL) 5 MG capsule Take 1 capsule (5 mg total) by mouth 2 (two) times daily before a meal. (Patient not taking: Reported on 02/22/2020) 30 capsule 0  . fluconazole (DIFLUCAN) 100 MG tablet Take 1 tablet (100 mg total) by mouth daily. (Patient not taking: Reported on 02/22/2020) 10 tablet 0  . fluconazole (DIFLUCAN) 100 MG tablet Take 1 tablet (100 mg total) by mouth daily. Take 2 tablets on day 1 then take 1 tablet everyday for 13 days (Patient not taking: Reported on 02/22/2020) 15 tablet 0  . lidocaine (XYLOCAINE) 2 % solution Patient: Mix 1part 2% viscous lidocaine, 1part H20. Swish & swallow 38mL of diluted mixture, 51min before meals and at bedtime, up to QID. May otherwise swish and spit up to 8 times daily. (Patient not taking: Reported on 02/22/2020) 100 mL 5  . lidocaine-prilocaine (EMLA) cream Apply small amount to port a cath site and cover with plastic wrap one hour prior to chemotherapy appointments. (Patient not taking: Reported on 02/22/2020) 30 g 3  . omeprazole (PRILOSEC) 40 MG capsule Take 40 mg by mouth daily.      Facility-Administered Medications Prior to Visit  Medication Dose Route Frequency Provider Last Rate Last Admin  . heparin lock flush 100 unit/mL  500 Units Intracatheter Once PRN Derek Jack, MD      . sodium chloride 0.9 % 1,000 mL with potassium chloride 20 mEq, magnesium sulfate 2 g infusion   Intravenous Once Derek Jack, MD      . sodium chloride flush (NS) 0.9 % injection 10 mL  10 mL Intracatheter Once PRN Derek Jack, MD

## 2020-02-22 NOTE — Patient Instructions (Signed)
-   CT Chest Without Contrast - Bronchoscopy with Dr. Lamonte Sakai on 03/06/20 - Further recommendations depend on bronchoscopy results

## 2020-02-23 ENCOUNTER — Telehealth: Payer: Self-pay | Admitting: Internal Medicine

## 2020-02-23 DIAGNOSIS — R918 Other nonspecific abnormal finding of lung field: Secondary | ICD-10-CM

## 2020-02-23 NOTE — Telephone Encounter (Signed)
Labs ordered nothing further needed.

## 2020-02-23 NOTE — Telephone Encounter (Signed)
Orders placed for bronch, [place] doing procedure

## 2020-02-27 ENCOUNTER — Other Ambulatory Visit: Payer: Self-pay

## 2020-02-27 ENCOUNTER — Ambulatory Visit (HOSPITAL_COMMUNITY)
Admission: RE | Admit: 2020-02-27 | Discharge: 2020-02-27 | Disposition: A | Discharge status: Home / Self Care | Payer: BC Managed Care – PPO | Source: Ambulatory Visit | Attending: Internal Medicine | Admitting: Internal Medicine

## 2020-02-27 DIAGNOSIS — R918 Other nonspecific abnormal finding of lung field: Secondary | ICD-10-CM | POA: Diagnosis present

## 2020-02-27 NOTE — Progress Notes (Signed)
Thoracic Location of Tumor / Histology:  LEFT upper lobe pulmonary nodule/prevascular lymph node  Patient had restaging PET scan on 02/13/2020 which showed "Ill-defined 9 mm short axis right level 1B node with SUV 8.6, previously 5.6. 2.1 cm short axis superior mediastinal/prevascular node, previously 8 mm, max SUV 13.2."  Biopsies revealed:  TBD: scheduled for navigational bronchoscopy and biopsy on 03/06/2020  Tobacco/Marijuana/Snuff/ETOH use: None  Past/Anticipated interventions by cardiothoracic surgery, if any:  Plan for navigational bronchoscopy and biopsy by Dr. Baltazar Apo on 03/06/2020  Past/Anticipated interventions by medical oncology, if any: Under care of Dr. Derek Jack -IMRT with weekly cisplatin from 06/27/2019 through 07/25/2019. -I have recommended biopsy of the left lung nodule/prevascular lymph node.  If this confirms squamous cell carcinoma, it will preclude her from having surgical resection of the submental lymph node.  Then we will recommend systemic therapy. -We will make a referral to pulmonology for possible navigational bronchoscopy and biopsy.  If not we will make referral to CT surgery. -I will also reach out to Dr. Isidore Moos to see if there is any role for SBRT to the nodes after biopsy if patient wants to delay systemic therapy. -I will see her back after the biopsy.  Signs/Symptoms  Weight changes, if any: Patient continues to lose weight without trying  Respiratory complaints, if any: None  Hemoptysis, if any: None  Pain issues, if any:  None to chest, but she does have pain in mouth (around gum line)  SAFETY ISSUES:  Prior radiation? Yes--H&N/tongue: 06/27/2019-08/05/2019; 30 fx for total dose of 60 Gy   Pacemaker/ICD? No   Possible current pregnancy? No  Is the patient on methotrexate? No  Current Complaints / other details:   She continues to have dry mouth and issues with thrush (which Dr. Delton Coombes is treating)

## 2020-02-28 ENCOUNTER — Ambulatory Visit
Admission: RE | Admit: 2020-02-28 | Discharge: 2020-02-28 | Disposition: A | Discharge status: Home / Self Care | Payer: BC Managed Care – PPO | Source: Ambulatory Visit | Attending: Radiation Oncology | Admitting: Radiation Oncology

## 2020-02-28 ENCOUNTER — Encounter: Payer: Self-pay | Admitting: Radiation Oncology

## 2020-02-28 DIAGNOSIS — R918 Other nonspecific abnormal finding of lung field: Secondary | ICD-10-CM

## 2020-02-28 DIAGNOSIS — C3412 Malignant neoplasm of upper lobe, left bronchus or lung: Secondary | ICD-10-CM

## 2020-02-28 DIAGNOSIS — C3411 Malignant neoplasm of upper lobe, right bronchus or lung: Secondary | ICD-10-CM

## 2020-02-28 DIAGNOSIS — C023 Malignant neoplasm of anterior two-thirds of tongue, part unspecified: Secondary | ICD-10-CM

## 2020-02-28 NOTE — Progress Notes (Signed)
Radiation Oncology         [phone] ________________________________  Name: Terri [name] MRN: [id number]  Date: 02/28/2020  DOB: November 24, 1950  Re-Consultation Note by telephone as patient opted for telemedicine was unable to access MyChart video during pandemic precautions   CC: [name], DO  [name], DO  Diagnosis and Prior Radiotherapy:       ICD-10-CM   1. Mass of upper lobe of left lung  R91.8   2. 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 Malignant neoplasm of upper lobe, left bronchus or lung (HCC)  C34.12   4. Malignant neoplasm of upper lobe, right bronchus or lung (HCC)  C34.11     06/27/2019 through 08/05/2019  Site Technique Total Dose (Gy) Dose per Fx (Gy) Completed Fx Beam Energies  Head & neck: HN_tongue IMRT 60/60 2 30/30 6X    CHIEF COMPLAINT:   Lung masses  Narrative:   Terri [name] returns today for re-consultation.  She has been pursuing observation by medical oncology.  She had a persistent right level 1B lymph node that was hypermetabolic and concerning for persistent disease, but the decision was made to rule out metastases over the long-term before considering salvage surgery.  She also had a left upper lobe pulmonary nodule that warranted observation  Restaging PET scan on 02/13/2020 showing: mildly progressive hypermetabolic 9 mm right level 1B node (max SUV 8.6, previously 5.8); markedly progressive mediastinal/prevascular node (now 2.1 cm, previously 8 mm).  She met with Dr. Delton Coombes on 02/15/2020 to discuss the scan. According to his note, the plan is for biopsy of the left lung nodule/prevascular lymph node.  If this confirms squamous cell carcinoma, it will preclude her from having surgical resection of the submental lymph node.  Then he may ecommend systemic therapy.  She was referred to Dr. Ina Homes in pulmonology regarding the prevascular node. Chest CT was performed yesterday, 02/27/2020,  revealing: LUL mass and RUL nodule may represent metastatic squamous cell carcinoma or synchronous primary bronchogenic carcinomas; LUL mass invades mediastinum and likely involved left phrenic nerve given left hemidiaphragm elevation, new from 10/2019. She is scheduled for bronchoscopy under Dr. Lamonte Sakai on 03/06/2020.  She is losing weight  - eating is a struggle due to changes in her tongue, and she lacks appetite. (She is going to reach out to [employer] RD, again).   Wt Readings from Last 3 Encounters:  02/22/20 143 lb (64.9 kg)  02/15/20 145 lb 6.4 oz (66 kg)  11/08/19 153 lb 9.6 oz (69.7 kg)     ALLERGIES:  has No Known Allergies.  Meds: Current Outpatient Medications  Medication Sig Dispense Refill  . acetaminophen (TYLENOL) 650 MG CR tablet Take 1,300 mg by mouth every 8 (eight) hours as needed for pain.    . DENTA 5000 PLUS 1.1 % CREA dental cream Place 1 application onto teeth at bedtime.     . fluconazole (DIFLUCAN) 100 MG tablet Take 1 tablet (100 mg total) by mouth daily. Take 2 tablets on day 1 then take 1 tablet everyday for 13 days (Patient taking differently: Take 100-200 mg by mouth See admin instructions. Take 200 mg on day 1 then take 100 mg everyday for 13 days) 15 tablet 0  . lidocaine-prilocaine (EMLA) cream Apply small amount to port a cath site and cover with plastic wrap one hour prior to chemotherapy appointments. (Patient taking differently: Apply 1 application topically daily as needed (port  access). ) 30 g 3  . OVER THE COUNTER MEDICATION Take 1 tablet by mouth daily. VS-C supplement    . Probiotic CAPS Take 1 capsule by mouth daily.     No current facility-administered medications for this encounter.   Facility-Administered Medications Ordered in Other Encounters  Medication Dose Route Frequency Provider Last Rate Last Admin  . heparin lock flush 100 unit/mL  500 Units Intracatheter Once PRN Derek Jack, MD      . sodium chloride 0.9 % 1,000 mL with  potassium chloride 20 mEq, magnesium sulfate 2 g infusion   Intravenous Once Derek Jack, MD      . sodium chloride flush (NS) 0.9 % injection 10 mL  10 mL Intracatheter Once PRN Derek Jack, MD        Physical Findings: The patient is in no acute distress. Patient is alert and oriented. Wt Readings from Last 3 Encounters:  02/22/20 143 lb (64.9 kg)  02/15/20 145 lb 6.4 oz (66 kg)  11/08/19 153 lb 9.6 oz (69.7 kg)    vitals were not taken for this visit. .  General: Alert and oriented, in no acute distress  Psychiatric: Judgment and insight are intact. Affect is appropriate.   Lab Findings: Lab Results  Component Value Date   WBC 2.6 (L) 03/02/2020   HGB 14.2 03/02/2020   HCT 41.5 03/02/2020   MCV 89.0 03/02/2020   PLT 182.0 03/02/2020    Lab Results  Component Value Date   TSH 3.242 11/07/2019    Radiographic Findings: NM PET Image Restag (PS) Skull Base To Thigh  Result Date: 02/14/2020 CLINICAL DATA:  Subsequent treatment strategy for tongue cancer. EXAM: NUCLEAR MEDICINE PET SKULL BASE TO THIGH TECHNIQUE: 9.02 mCi F-18 FDG was injected intravenously. Full-ring PET imaging was performed from the skull base to thigh after the radiotracer. CT data was obtained and used for attenuation correction and anatomic localization. Fasting blood glucose: 71 mg/dl COMPARISON:  CT neck dated 11/12/2019.  PET-CT dated 11/04/2019. FINDINGS: Mediastinal blood pool activity: SUV max 2.2 Liver activity: SUV max NA NECK: Bilateral but asymmetric hypermetabolism involving the right > left base of tongue, regional/geographic and non masslike, max SUV 7.3 (previously 4.3). This appearance is favored to be physiologic/reactive. Associated linear hypermetabolism involving the right digastric muscle, extending from the styloid to the right base of tongue, max SUV 16.2. Although unusual, this favors physiologic muscular activity rather than nodal metastasis. Ill-defined 9 mm short axis  right level 1B node (series 3/image 95), max SUV 8.6, previously 5.8. This remains concerning for nodal metastasis. Incidental CT findings: none CHEST: 2.1 cm short axis superior mediastinal/prevascular node (series 3/image 127), previously 8 mm, max SUV 13.2. No suspicious pulmonary nodules. Incidental CT findings: Atherosclerotic calcifications of the aortic arch. Aberrant right subclavian artery. Left chest port terminates in the lower SVC. ABDOMEN/PELVIS: No abnormal hypermetabolism in the liver, spleen, pancreas, or adrenal glands. No hypermetabolic abdominopelvic lymphadenopathy. Incidental CT findings: Mild atherosclerotic calcifications the abdominal aorta. Left colonic diverticulosis, without evidence of diverticulitis. SKELETON: No focal hypermetabolic activity to suggest skeletal metastasis. Incidental CT findings: Mild degenerative changes of the thoracolumbar spine. IMPRESSION: Hypermetabolic 9 mm short axis right level 1B node, mildly progressive, concerning for nodal metastasis. 2.1 cm short axis superior mediastinal/prevascular node, markedly progressive, concerning for thoracic nodal metastasis. Asymmetric hypermetabolism involving the right base of tongue, likely reactive. Associated linear metabolism involving the right digastric muscle, unusual but favored to be physiologic/reactive. Electronically Signed   By: Julian Hy  M.D.   On: 02/14/2020 11:19   CT Super D Chest Wo Contrast  Result Date: 02/27/2020 CLINICAL DATA:  Lung nodule. Pre navigational bronchoscopy. 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 EXAM: CT CHEST WITHOUT CONTRAST TECHNIQUE: Multidetector CT imaging of the chest was performed using thin slice collimation for electromagnetic bronchoscopy planning purposes, without intravenous contrast. COMPARISON:  PET 02/13/2020, 11/04/2019 and CT neck 11/11/2019. FINDINGS: Cardiovascular: Left IJ Port-A-Cath terminates in the  low SVC. Aberrant right subclavian artery. Atherosclerotic calcification of the aorta. Heart is at the upper limits of normal in size. No pericardial effusion. Mediastinum/Nodes: No pathologically enlarged mediastinal or axillary lymph nodes. Hilar regions are difficult to evaluate without IV contrast. Esophagus is grossly unremarkable. Lungs/Pleura: Medial left upper lobe mass invades the mediastinum, measures 2.2 x 3.1 cm (4/47) and is stable in size from 02/13/2020. Irregular right upper lobe nodule is seen peripherally, measuring 8 x 10 mm (4/46), also stable from 02/13/2020. Lungs are otherwise clear. No pleural fluid. Airway is unremarkable. Upper Abdomen: Visualized portions of the liver and gallbladder are unremarkable. There may be mild thickening of both adrenal glands. Visualized portions of the kidneys, spleen, pancreas, stomach and bowel are otherwise unremarkable. Left hemidiaphragm is elevated, stable from 02/13/2020 but new from 11/04/2019. Musculoskeletal: Degenerative changes in the spine. No worrisome lytic or sclerotic lesions. IMPRESSION: 1. Left upper lobe mass and right upper lobe nodule may represent metastatic squamous cell carcinoma or synchronous primary bronchogenic carcinomas. 2. Left upper lobe mass invades the mediastinum and likely involves the left phrenic nerve given left hemidiaphragm elevation, new from 11/04/2019. 3.  Aortic atherosclerosis (ICD10-I70.0). Electronically Signed   By: Lorin Picket M.D.   On: 02/27/2020 12:31      Impression/Plan:    This is a very nice 69 year old woman with a history of oral cancer who unfortunately has progressive findings in her lungs on surveillance imaging.  She has a right upper lobe nodule and a more suspicious left upper lobe mass that appears to be invading the mediastinum and involving the phrenic nerve.  It is unclear whether this is metastatic disease or second primaries.  She is at significant risk for metastatic disease;  biopsy is pending.  Also, she has mild progression of a right level 1B lymph node that was previously in the radiation fields for her head and neck cancer treatments.  She understands that the findings in her lungs are suspicious and that some type of treatment is likely.  This may be systemic therapy alone, radiation therapy alone, or combination of both.  She and I share deep disappointment that she has not been able reach remission despite sustaining significant trimodality therapy in the past.  I have tentatively scheduled her for a CT simulation in early June, hoping that the biopsy results will be available by then.  A multidisciplinary decision will be made once her pathology is available.  She appreciates this plan.  All of her questions were answered to her satisfaction.   This encounter was provided by telemedicine platform by telephone as patient was unable to access MyChart video during pandemic precautions The patient has given verbal consent for this type of encounter and has been advised to only accept a meeting of this type in a secure network environment. The time spent during this encounter on date of service, in total, was 30 minutes. The attendants for this meeting include Eppie Gibson  and Gearldine Shown.  During the encounter, Eppie Gibson  was located at [facility] Radiation Oncology Department.  Brookley Spitler was located at home.   _____________________________________   Eppie Gibson, MD  This document serves as a record of services personally performed by Eppie Gibson, MD. It was created on her behalf by Wilburn Mylar, a trained medical scribe. The creation of this record is based on the scribe's personal observations and the provider's statements to them. This document has been checked and approved by the attending provider.

## 2020-03-01 ENCOUNTER — Encounter (HOSPITAL_COMMUNITY): Payer: Self-pay | Admitting: Emergency Medicine

## 2020-03-01 ENCOUNTER — Other Ambulatory Visit: Payer: Self-pay

## 2020-03-01 NOTE — Progress Notes (Signed)
Pt denies SOB, chest pain, and being under the care of a cardiologist. Pt stated that PCP is Dr. [name]. Pt denies having a cardiac cath but stated that a stress test and possibly an echo were performed by Dr. Darral Dash, Cardiology in [place], [place]. Nurse requested LOV note and all cardiac studies from cardiologist. Pt stated that she does not take Aspirin. Pt made aware to stop taking vitamins, fish oil, VS-C Supplement and herbal medications. Do not take any NSAIDs ie: Ibuprofen, Advil, Naproxen (Aleve), Motrin, BC and Goody Powder. Pt reminded to quarantine. Pt verbalized understanding of all pre-op instructions. PA, Anesthesiology, asked to review pt history.

## 2020-03-02 ENCOUNTER — Other Ambulatory Visit (HOSPITAL_COMMUNITY)
Admission: RE | Admit: 2020-03-02 | Discharge: 2020-03-02 | Disposition: A | Discharge status: Home / Self Care | Payer: BC Managed Care – PPO | Source: Ambulatory Visit | Attending: Emergency Medicine | Admitting: Emergency Medicine

## 2020-03-02 ENCOUNTER — Other Ambulatory Visit: Payer: BC Managed Care – PPO

## 2020-03-02 ENCOUNTER — Encounter: Payer: Self-pay | Admitting: Radiation Oncology

## 2020-03-02 ENCOUNTER — Encounter (HOSPITAL_COMMUNITY): Payer: Self-pay | Admitting: Emergency Medicine

## 2020-03-02 DIAGNOSIS — Z01812 Encounter for preprocedural laboratory examination: Secondary | ICD-10-CM | POA: Diagnosis not present

## 2020-03-02 DIAGNOSIS — Z20822 Contact with and (suspected) exposure to covid-19: Secondary | ICD-10-CM | POA: Diagnosis not present

## 2020-03-02 DIAGNOSIS — C3411 Malignant neoplasm of upper lobe, right bronchus or lung: Secondary | ICD-10-CM | POA: Insufficient documentation

## 2020-03-02 DIAGNOSIS — R918 Other nonspecific abnormal finding of lung field: Secondary | ICD-10-CM

## 2020-03-02 DIAGNOSIS — C3412 Malignant neoplasm of upper lobe, left bronchus or lung: Secondary | ICD-10-CM | POA: Insufficient documentation

## 2020-03-02 LAB — COMPREHENSIVE METABOLIC PANEL
ALT: 19 U/L (ref 0–35)
AST: 23 U/L (ref 0–37)
Albumin: 4.4 g/dL (ref 3.5–5.2)
Alkaline Phosphatase: 69 U/L (ref 39–117)
BUN: 36 mg/dL — ABNORMAL HIGH (ref 6–23)
CO2: 28 mEq/L (ref 19–32)
Calcium: 10.3 mg/dL (ref 8.4–10.5)
Chloride: 98 mEq/L (ref 96–112)
Creatinine, Ser: 1 mg/dL (ref 0.40–1.20)
GFR: 66.53 mL/min (ref >60.00)
Glucose, Bld: 87 mg/dL (ref 70–99)
Potassium: 4.6 mEq/L (ref 3.5–5.1)
Sodium: 133 mEq/L — ABNORMAL LOW (ref 135–145)
Total Bilirubin: 0.3 mg/dL (ref 0.2–1.2)
Total Protein: 8.1 g/dL (ref 6.0–8.3)

## 2020-03-02 LAB — CBC WITH DIFFERENTIAL/PLATELET
Basophils Absolute: 0 10*3/uL (ref 0.0–0.1)
Basophils Relative: 0.8 % (ref 0.0–3.0)
Eosinophils Absolute: 0.1 10*3/uL (ref 0.0–0.7)
Eosinophils Relative: 5 % (ref 0.0–5.0)
HCT: 41.5 % (ref 36.0–46.0)
Hemoglobin: 14.2 g/dL (ref 12.0–15.0)
Lymphocytes Relative: 13.6 % (ref 12.0–46.0)
Lymphs Abs: 0.4 10*3/uL — ABNORMAL LOW (ref 0.7–4.0)
MCHC: 34.2 g/dL (ref 30.0–36.0)
MCV: 89 fl (ref 78.0–100.0)
Monocytes Absolute: 0.4 10*3/uL (ref 0.1–1.0)
Monocytes Relative: 17 % — ABNORMAL HIGH (ref 3.0–12.0)
Neutro Abs: 1.7 10*3/uL (ref 1.4–7.7)
Neutrophils Relative %: 63.6 % (ref 43.0–77.0)
Platelets: 182 10*3/uL (ref 150.0–400.0)
RBC: 4.67 Mil/uL (ref 3.87–5.11)
RDW: 13.3 % (ref 11.5–15.5)
WBC: 2.6 10*3/uL — ABNORMAL LOW (ref 4.0–10.5)

## 2020-03-02 LAB — PROTIME-INR
INR: 1.1 ratio — ABNORMAL HIGH (ref 0.8–1.0)
Prothrombin Time: 12.8 s (ref 9.6–13.1)

## 2020-03-02 LAB — APTT: aPTT: 35.6 s — ABNORMAL HIGH (ref 23.4–32.7)

## 2020-03-02 NOTE — Anesthesia Preprocedure Evaluation (Addendum)
Anesthesia Evaluation  Patient identified by MRN, date of birth, ID band Patient awake    Reviewed: Allergy & Precautions, NPO status , Patient's Chart, lab work & pertinent test results  Airway Mallampati: IV  TM Distance: <3 FB Neck ROM: Limited  Mouth opening: Limited Mouth Opening  Dental  (+) Teeth Intact, Dental Advisory Given   Pulmonary  Lung mass- CT super D chest 02/27/20:  1. Left upper lobe mass and right upper lobe nodule may represent metastatic squamous cell carcinoma or synchronous primary bronchogenic carcinomas. 2. Left upper lobe mass invades the mediastinum and likely involves the left phrenic nerve given left hemidiaphragm elevation, new from 11/04/2019. 3. Aortic atherosclerosis   Sleeps sitting up d/t mouth dryness per pt, ever since radiation   Pulmonary exam normal        Cardiovascular (-) hypertensionNormal cardiovascular exam Rhythm:Regular Rate:Normal  Saw cardiologist Raechel Chute, MD in 2018 for chest pain and palpitations. Nuclear stress test and echo ordered, results demonstrated normal coronary flow and no significant structural heart disease.   Nuclear stress test 09/07/17:  1. Perfusion scan findings consistent with LBBB. No evidence for inducible ischemia 2. Normal resting LV systolic function with paradoxical septal motion   TTE 08/19/17 (from Dr. Lona Millard note 09/18/17):  1. EF 50%, borderline LVH, pronounced PSM due to LBBB, grade 1 LV filling, normal resting LVFP, LAVi 23 2. normal RVSP, normal RV function and RAP 3. Structurally grossly normal valves for age with minimal aortic sclerosis, mild central MR, trivial TR, DHV    Neuro/Psych negative neurological ROS  negative psych ROS   GI/Hepatic Neg liver ROS, GERD  Controlled,  Endo/Other  negative endocrine ROS  Renal/GU negative Renal ROS  negative genitourinary   Musculoskeletal  (+) Arthritis , Osteoarthritis,    Abdominal Normal abdominal exam  (+)   Peds  Hematology negative hematology ROS (+)   Anesthesia Other Findings Tongue ca s/p radiation, neck dissection and portacath in 2020  Has had oral thrush since surgery   Reproductive/Obstetrics negative OB ROS                           Anesthesia Physical Anesthesia Plan  ASA: IV  Anesthesia Plan: General   Post-op Pain Management:    Induction: Intravenous and Inhalational  PONV Risk Score and Plan: 3 and Ondansetron and Treatment may vary due to age or medical condition  Airway Management Planned: Oral ETT  Additional Equipment: None  Intra-op Plan:   Post-operative Plan: Extubation in OR  Informed Consent: I have reviewed the patients History and Physical, chart, labs and discussed the procedure including the risks, benefits and alternatives for the proposed anesthesia with the patient or authorized representative who has indicated his/her understanding and acceptance.     Dental advisory given  Plan Discussed with: CRNA  Anesthesia Plan Comments: (Lung mass invading mediastinum, will attempt to keep patient spontaneously ventilated throughout intubation and procedure. Will be prepared for difficult airway given history fo neck dissection, tongue ca and radiation to head/neck and non-reassuring airway on physical exam. )      Anesthesia Quick Evaluation

## 2020-03-02 NOTE — Progress Notes (Signed)
Anesthesia Chart Review:  Pt is a same day work up    Case: [id number] Date/Time: 03/06/20 1130   Procedures:      VIDEO BRONCHOSCOPY WITH ENDOBRONCHIAL NAVIGATION (N/A )     VIDEO BRONCHOSCOPY WITH FLUORO (N/A )   Anesthesia type: General   Pre-op diagnosis: LUNG MASS   Location: [facility] ROOM 2 / [facility]   Surgeons: Collene Gobble, MD      DISCUSSION:  - Pt is a 69 year old with hx LBBB, HTN, SCC of tongue   PROVIDERS: - PCP is Minette Brine, Morley Kos, DO  - Saw cardiologist Raechel Chute, MD in 2018 for chest pain and palpitations. Nuclear stress test and echo ordered, results demonstrated normal coronary flow and no significant structural heart disease.    LABS: Will be obtained day of surgery    IMAGES:  CT super D chest 02/27/20:  1. Left upper lobe mass and right upper lobe nodule may represent metastatic squamous cell carcinoma or synchronous primary bronchogenic carcinomas. 2. Left upper lobe mass invades the mediastinum and likely involves the left phrenic nerve given left hemidiaphragm elevation, new from 11/04/2019. 3.  Aortic atherosclerosis    EKG 06/10/19: NSR, LBBB, LAD. Appears stable compared with prior EKG 09/18/17 (done at Dr. Lona Millard office)   CV:   Nuclear stress test 09/07/17:  1. Perfusion scan findings consistent with LBBB. No evidence for inducible ischemia 2. Normal resting LV systolic function with paradoxical septal motion   TTE 08/19/17 (from Dr. Lona Millard note 09/18/17):  1. EF 50%, borderline LVH, pronounced PSM due to LBBB, grade 1 LV filling, normal resting LVFP, LAVi 23 2. normal RVSP, normal RV function and RAP 3. Structurally grossly normal valves for age with minimal aortic sclerosis, mild central MR, trivial TR, DHV   Past Medical History:  Diagnosis Date  . Arthritis   . Cancer (HCC)    tongue cancer  . GERD (gastroesophageal reflux disease)   . History of radiation therapy 06/27/19- 08/05/19   HN tongue, 30 fractions of 2  Gy each to total 60 Gy.   [name] Hypertension   . LBBB (left bundle branch block)   . Port-A-Cath in place 06/14/2019  . Wears glasses    or contact lenses    Past Surgical History:  Procedure Laterality Date  . COLONOSCOPY W/ BIOPSIES AND POLYPECTOMY    . 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 Dr. Nicolette Bang at [facility]  . PORTACATH PLACEMENT Left 06/10/2019   Procedure: INSERTION PORT-A-CATH;  Surgeon: Aviva Signs, MD;  Location: [facility] ORS;  Service: General;  Laterality: Left;  pt knows to arrive at 6:15  . TUBAL LIGATION      MEDICATIONS: No current facility-administered medications for this encounter.   [name] acetaminophen (TYLENOL) 650 MG CR tablet  . DENTA 5000 PLUS 1.1 % CREA dental cream  . fluconazole (DIFLUCAN) 100 MG tablet  . lidocaine-prilocaine (EMLA) cream  . OVER THE COUNTER MEDICATION  . Probiotic CAPS   . heparin lock flush 100 unit/mL  . sodium chloride 0.9 % 1,000 mL with potassium chloride 20 mEq, magnesium sulfate 2 g infusion  . sodium chloride flush (NS) 0.9 % injection 10 mL    If labs acceptable day of surgery, I anticipate pt can proceed with surgery as scheduled.  Willeen Cass, FNP-BC [facility]/Anesthesiology Phone: [phone] 03/02/2020 11:52 AM

## 2020-03-03 LAB — SARS CORONAVIRUS 2 (TAT 6-24 HRS): SARS Coronavirus 2: NEGATIVE

## 2020-03-06 ENCOUNTER — Encounter (HOSPITAL_COMMUNITY)
Admission: RE | Disposition: A | Discharge status: Home / Self Care | Payer: Self-pay | Source: Home / Self Care | Attending: Emergency Medicine

## 2020-03-06 ENCOUNTER — Ambulatory Visit (HOSPITAL_COMMUNITY): Payer: BC Managed Care – PPO | Admitting: Emergency Medicine

## 2020-03-06 ENCOUNTER — Ambulatory Visit (HOSPITAL_COMMUNITY): Payer: BC Managed Care – PPO

## 2020-03-06 ENCOUNTER — Other Ambulatory Visit: Payer: Self-pay

## 2020-03-06 ENCOUNTER — Encounter (HOSPITAL_COMMUNITY): Payer: Self-pay | Admitting: Emergency Medicine

## 2020-03-06 ENCOUNTER — Ambulatory Visit (HOSPITAL_COMMUNITY)
Admission: RE | Admit: 2020-03-06 | Discharge: 2020-03-06 | Disposition: A | Discharge status: Home / Self Care | Payer: BC Managed Care – PPO | Attending: Emergency Medicine | Admitting: Emergency Medicine

## 2020-03-06 DIAGNOSIS — Z79899 Other long term (current) drug therapy: Secondary | ICD-10-CM | POA: Insufficient documentation

## 2020-03-06 DIAGNOSIS — C3411 Malignant neoplasm of upper lobe, right bronchus or lung: Secondary | ICD-10-CM | POA: Diagnosis not present

## 2020-03-06 DIAGNOSIS — Z85810 Personal history of malignant neoplasm of tongue: Secondary | ICD-10-CM | POA: Diagnosis not present

## 2020-03-06 DIAGNOSIS — K219 Gastro-esophageal reflux disease without esophagitis: Secondary | ICD-10-CM | POA: Diagnosis not present

## 2020-03-06 DIAGNOSIS — C3412 Malignant neoplasm of upper lobe, left bronchus or lung: Secondary | ICD-10-CM

## 2020-03-06 DIAGNOSIS — M1990 Unspecified osteoarthritis, unspecified site: Secondary | ICD-10-CM | POA: Insufficient documentation

## 2020-03-06 DIAGNOSIS — Z923 Personal history of irradiation: Secondary | ICD-10-CM | POA: Diagnosis not present

## 2020-03-06 DIAGNOSIS — Z79891 Long term (current) use of opiate analgesic: Secondary | ICD-10-CM | POA: Insufficient documentation

## 2020-03-06 DIAGNOSIS — I10 Essential (primary) hypertension: Secondary | ICD-10-CM | POA: Diagnosis not present

## 2020-03-06 DIAGNOSIS — Z95828 Presence of other vascular implants and grafts: Secondary | ICD-10-CM

## 2020-03-06 DIAGNOSIS — R911 Solitary pulmonary nodule: Secondary | ICD-10-CM | POA: Diagnosis present

## 2020-03-06 DIAGNOSIS — Z9889 Other specified postprocedural states: Secondary | ICD-10-CM

## 2020-03-06 DIAGNOSIS — R918 Other nonspecific abnormal finding of lung field: Secondary | ICD-10-CM

## 2020-03-06 DIAGNOSIS — C023 Malignant neoplasm of anterior two-thirds of tongue, part unspecified: Secondary | ICD-10-CM

## 2020-03-06 HISTORY — PX: BRONCHIAL WASHINGS: SHX5105

## 2020-03-06 HISTORY — DX: Unspecified osteoarthritis, unspecified site: M19.90

## 2020-03-06 HISTORY — PX: BRONCHIAL NEEDLE ASPIRATION BIOPSY: SHX5106

## 2020-03-06 HISTORY — DX: Left bundle-branch block, unspecified: I44.7

## 2020-03-06 HISTORY — DX: Presence of spectacles and contact lenses: Z97.3

## 2020-03-06 HISTORY — PX: BRONCHIAL BRUSHINGS: SHX5108

## 2020-03-06 HISTORY — PX: BRONCHIAL BIOPSY: SHX5109

## 2020-03-06 HISTORY — PX: VIDEO BRONCHOSCOPY WITH ENDOBRONCHIAL NAVIGATION: SHX6175

## 2020-03-06 HISTORY — DX: Malignant (primary) neoplasm, unspecified: C80.1

## 2020-03-06 HISTORY — DX: Gastro-esophageal reflux disease without esophagitis: K21.9

## 2020-03-06 LAB — TYPE AND SCREEN
ABO/RH(D): O POS
Antibody Screen: NEGATIVE

## 2020-03-06 LAB — ABO/RH: ABO/RH(D): O POS

## 2020-03-06 SURGERY — VIDEO BRONCHOSCOPY WITH ENDOBRONCHIAL NAVIGATION
Anesthesia: General

## 2020-03-06 MED ORDER — ROCURONIUM BROMIDE 10 MG/ML (PF) SYRINGE
PREFILLED_SYRINGE | INTRAVENOUS | Status: DC | PRN
  Administered 2020-03-06: 25 mg via INTRAVENOUS

## 2020-03-06 MED ORDER — CEFAZOLIN SODIUM-DEXTROSE 2-4 GM/100ML-% IV SOLN
INTRAVENOUS | Status: AC
  Filled 2020-03-06: qty 100

## 2020-03-06 MED ORDER — FLUCONAZOLE 100 MG PO TABS: 100.0000 mg | ORAL_TABLET | ORAL | Status: DC

## 2020-03-06 MED ORDER — PHENYLEPHRINE HCL-NACL 10-0.9 MG/250ML-% IV SOLN
INTRAVENOUS | Status: DC | PRN
  Administered 2020-03-06: 25 ug/min via INTRAVENOUS

## 2020-03-06 MED ORDER — EPHEDRINE SULFATE-NACL 50-0.9 MG/10ML-% IV SOSY
PREFILLED_SYRINGE | INTRAVENOUS | Status: DC | PRN
  Administered 2020-03-06: 10 mg via INTRAVENOUS

## 2020-03-06 MED ORDER — LIDOCAINE HCL (PF) 1 % IJ SOLN
INTRAMUSCULAR | Status: AC
  Filled 2020-03-06: qty 30

## 2020-03-06 MED ORDER — KETAMINE HCL 10 MG/ML IJ SOLN
INTRAMUSCULAR | Status: DC | PRN
Start: 2020-03-06 — End: 2020-03-06
  Administered 2020-03-06: 25 mg via INTRAVENOUS

## 2020-03-06 MED ORDER — LACTATED RINGERS IV SOLN: INTRAVENOUS | Status: DC

## 2020-03-06 MED ORDER — MIDAZOLAM HCL 5 MG/5ML IJ SOLN
INTRAMUSCULAR | Status: DC | PRN
  Administered 2020-03-06 (×2): 1 mg via INTRAVENOUS

## 2020-03-06 MED ORDER — PROPOFOL 10 MG/ML IV BOLUS
INTRAVENOUS | Status: DC | PRN
  Administered 2020-03-06: 50 mg via INTRAVENOUS

## 2020-03-06 MED ORDER — DEXMEDETOMIDINE HCL 200 MCG/2ML IV SOLN
INTRAVENOUS | Status: DC | PRN
  Administered 2020-03-06: 12 ug via INTRAVENOUS

## 2020-03-06 MED ORDER — FENTANYL CITRATE (PF) 250 MCG/5ML IJ SOLN
INTRAMUSCULAR | Status: DC | PRN
  Administered 2020-03-06 (×2): 50 ug via INTRAVENOUS

## 2020-03-06 MED ORDER — LIDOCAINE 2% (20 MG/ML) 5 ML SYRINGE
INTRAMUSCULAR | Status: DC | PRN
  Administered 2020-03-06: 60 mg via INTRAVENOUS
  Administered 2020-03-06: 20 mg via INTRAVENOUS

## 2020-03-06 MED ORDER — DEXAMETHASONE SODIUM PHOSPHATE 10 MG/ML IJ SOLN
INTRAMUSCULAR | Status: DC | PRN
  Administered 2020-03-06: 5 mg via INTRAVENOUS

## 2020-03-06 MED ORDER — LIDOCAINE-PRILOCAINE 2.5-2.5 % EX CREA: 1.0000 "application " | TOPICAL_CREAM | Freq: Every day | CUTANEOUS | Status: DC | PRN

## 2020-03-06 MED ORDER — ONDANSETRON HCL 4 MG/2ML IJ SOLN
INTRAMUSCULAR | Status: DC | PRN
  Administered 2020-03-06: 4 mg via INTRAVENOUS

## 2020-03-06 MED ORDER — CHLORHEXIDINE GLUCONATE 0.12 % MT SOLN
OROMUCOSAL | Status: AC
  Filled 2020-03-06: qty 15

## 2020-03-06 MED ORDER — SUGAMMADEX SODIUM 200 MG/2ML IV SOLN
INTRAVENOUS | Status: DC | PRN
  Administered 2020-03-06: 150 mg via INTRAVENOUS

## 2020-03-06 MED ORDER — PHENYLEPHRINE 40 MCG/ML (10ML) SYRINGE FOR IV PUSH (FOR BLOOD PRESSURE SUPPORT)
PREFILLED_SYRINGE | INTRAVENOUS | Status: DC | PRN
  Administered 2020-03-06: 80 ug via INTRAVENOUS

## 2020-03-06 NOTE — Anesthesia Postprocedure Evaluation (Signed)
Anesthesia Post Note  Patient: Gearldine Shown  Procedure(s) Performed: VIDEO BRONCHOSCOPY WITH ENDOBRONCHIAL NAVIGATION (N/A ) BRONCHIAL BRUSHINGS BRONCHIAL NEEDLE ASPIRATION BIOPSIES BRONCHIAL BIOPSIES BRONCHIAL WASHINGS     Patient location during evaluation: PACU Anesthesia Type: General Level of consciousness: awake and alert, oriented and patient cooperative Pain management: pain level controlled Vital Signs Assessment: post-procedure vital signs reviewed and stable Respiratory status: spontaneous breathing, nonlabored ventilation and respiratory function stable Cardiovascular status: blood pressure returned to baseline and stable Postop Assessment: no apparent nausea or vomiting Anesthetic complications: no    Last Vitals:  Vitals:   03/06/20 1315 03/06/20 1322  BP: (!) 125/113 137/76  Pulse: 98 82  Resp: (!) 24 (!) 22  Temp: 36.6 C   SpO2: 100%     Last Pain:  Vitals:   03/06/20 1315  TempSrc:   PainSc: 0-No pain                 Jarome Matin Erlinda Solinger

## 2020-03-06 NOTE — Discharge Instructions (Signed)
Flexible Bronchoscopy, Care After This sheet gives you information about how to care for yourself after your test. Your doctor may also give you more specific instructions. If you have problems or questions, contact your doctor. Follow these instructions at home: Eating and drinking  Do not eat or drink anything (not even water) for 2 hours after your test, or until your numbing medicine (local anesthetic) wears off.  When your numbness is gone and your cough and gag reflexes have come back, you may: ? Eat only soft foods. ? Slowly drink liquids.  The day after the test, go back to your normal diet. Driving  Do not drive for 24 hours if you were given a medicine to help you relax (sedative).  Do not drive or use heavy machinery while taking prescription pain medicine. General instructions   Take over-the-counter and prescription medicines only as told by your doctor.  Return to your normal activities as told. Ask what activities are safe for you.  Do not use any products that have nicotine or tobacco in them. This includes cigarettes and e-cigarettes. If you need help quitting, ask your doctor.  Keep all follow-up visits as told by your doctor. This is important. It is very important if you had a tissue sample (biopsy) taken. Get help right away if:  You have shortness of breath that gets worse.  You get light-headed.  You feel like you are going to pass out (faint).  You have chest pain.  You cough up: ? More than a little blood. ? More blood than before. Summary  Do not eat or drink anything (not even water) for 2 hours after your test, or until your numbing medicine wears off.  Do not use cigarettes. Do not use e-cigarettes.  Get help right away if you have chest pain.  Please call our office for any questions or concerns.  [phone].  This information is not intended to replace advice given to you by your health care provider. Make sure you discuss any  questions you have with your health care provider. Document Revised: 09/04/2017 Document Reviewed: 10/10/2016 Elsevier Patient Education  2020 [employer].

## 2020-03-06 NOTE — Transfer of Care (Signed)
Immediate Anesthesia Transfer of Care Note  Patient: Gearldine Shown  Procedure(s) Performed: VIDEO BRONCHOSCOPY WITH ENDOBRONCHIAL NAVIGATION (N/A ) BRONCHIAL BRUSHINGS BRONCHIAL NEEDLE ASPIRATION BIOPSIES BRONCHIAL BIOPSIES BRONCHIAL WASHINGS  Patient Location: PACU  Anesthesia Type:General  Level of Consciousness: awake, alert  and oriented  Airway & Oxygen Therapy: Patient Spontanous Breathing and Patient connected to face mask oxygen  Post-op Assessment: Report given to RN, Post -op Vital signs reviewed and stable and Patient moving all extremities X 4  Post vital signs: Reviewed and stable  Last Vitals:  Vitals Value Taken Time  BP    Temp    Pulse    Resp    SpO2      Last Pain:  Vitals:   03/06/20 0949  TempSrc:   PainSc: 0-No pain         Complications: No apparent anesthesia complications

## 2020-03-06 NOTE — Anesthesia Procedure Notes (Signed)
Procedure Name: Intubation Date/Time: 03/06/2020 11:36 AM Performed by: Gaylene Brooks, CRNA Pre-anesthesia Checklist: Patient identified, Emergency Drugs available, Suction available and Patient being monitored Patient Re-evaluated:Patient Re-evaluated prior to induction Oxygen Delivery Method: Circle System Utilized Preoxygenation: Pre-oxygenation with 100% oxygen Induction Type: IV induction Ventilation: Mask ventilation without difficulty Laryngoscope Size: Glidescope and 3 Grade View: Grade I Tube type: Oral Tube size: 8.5 mm Number of attempts: 1 Airway Equipment and Method: Stylet and Oral airway Placement Confirmation: ETT inserted through vocal cords under direct vision,  positive ETCO2 and breath sounds checked- equal and bilateral Secured at: 22 cm Tube secured with: Tape Dental Injury: Teeth and Oropharynx as per pre-operative assessment  Difficulty Due To: Difficulty was anticipated, Difficult Airway- due to reduced neck mobility and Difficult Airway- due to limited oral opening Future Recommendations: Recommend- induction with short-acting agent, and alternative techniques readily available Comments: Pt induced, spontaneous resp maintained. Glide scope inserted into mouth without trouble. Grade 1/2 view. ETT passed through cords. +ETCO2,BBS=

## 2020-03-06 NOTE — Op Note (Signed)
Video Bronchoscopy with Electromagnetic Navigation Procedure Note  Date of Operation: 03/06/2020  Pre-op Diagnosis: Left upper lobe mass, right upper lobe pulmonary nodule  Post-op Diagnosis: Same  Surgeon: Baltazar Apo  Assistants: None  Anesthesia: General endotracheal anesthesia  Operation: Flexible video fiberoptic bronchoscopy with electromagnetic navigation and biopsies.  Estimated Blood Loss: Minimal  Complications: None apparent  Indications and History: Terri [name] is a 69 y.o. female with history of squamous cell cancer of the tongue.  She was found to have a left upper lobe medial mass on surveillance imaging as well as a small peripheral right upper lobe pulmonary nodule.  The left upper lobe mass was hypermetabolic on PET.  Recommendation was made to achieve tissue diagnosis via navigational bronchoscopy.  The risks, benefits, complications, treatment options and expected outcomes were discussed with the patient.  The possibilities of pneumothorax, pneumonia, reaction to medication, pulmonary aspiration, perforation of a viscus, bleeding, failure to diagnose a condition and creating a complication requiring transfusion or operation were discussed with the patient who freely signed the consent.    Description of Procedure: The patient was seen in the Preoperative Area, was examined and was deemed appropriate to proceed.  The patient was taken to [facility] endoscopy room 2, identified as Terri [name] and the procedure verified as Flexible Video Fiberoptic Bronchoscopy.  A Time Out was held and the above information confirmed.   Prior to the date of the procedure a high-resolution CT scan of the chest was performed. Utilizing [place] a virtual tracheobronchial tree was generated to allow the creation of distinct navigation pathways to the patient's parenchymal abnormalities. After being taken to the operating room general anesthesia was initiated and the patient  was  orally intubated. The video fiberoptic bronchoscope was introduced via the endotracheal tube and a general inspection was performed which showed normal airways throughout.  There were no endobronchial lesions or abnormal secretions seen. The extendable working channel and locator guide were introduced into the bronchoscope. The distinct navigation pathways prepared prior to this procedure were then utilized to navigate to within 0.8 cm of patient's lesions identified on CT scan. The extendable working channel was secured into place and the locator guide was withdrawn. Under fluoroscopic guidance transbronchial needle brushings, transbronchial Wang needle biopsies, and transbronchial forceps biopsies were performed at the left upper lobe mass (target 1) to be sent for cytology and pathology. A bronchioalveolar lavage was performed in the left upper lobe adjacent to the mass and sent for cytology.  Attention was then turned to the right upper lobe pulmonary nodule.  Transbronchial brushings and BAL were performed at the right upper lobe nodule.  At the end of the procedure a general airway inspection was performed and there was no evidence of active bleeding. The bronchoscope was removed.  The patient tolerated the procedure well. There was no significant blood loss and there were no obvious complications. A post-procedural chest x-ray is pending.  Samples: 1. Transbronchial needle brushings from left upper lobe mass 2. Transbronchial Wang needle biopsies from left upper lobe mass 3. Transbronchial forceps biopsies from left upper lobe mass 4. Bronchoalveolar lavage from left upper lobe 5.  Transbronchial brushings from right upper lobe pulmonary nodule 6.  Bronchoalveolar lavage from the right upper lobe  Plans:  The patient will be discharged from the PACU to home when recovered from anesthesia and after chest x-ray is reviewed. We will review the cytology, pathology and microbiology results with the  patient when they become available. Outpatient followup  will be with Dr. Lamonte Sakai or Dr. Ranee Gosselin, MD, PhD 03/06/2020, 12:55 PM [facility] [phone] or if no answer [phone]

## 2020-03-06 NOTE — Interval H&P Note (Signed)
PCCM Interval Note  69 year old woman who has been seen by Dr. Tamala Julian and is referred now for navigational bronchoscopy.  She is receiving therapy for squamous cell cancer of the tongue.  On surveillance imaging she was found to have a left medial mass that impacts the pericardium.  Also noted to have a small right upper lobe pulmonary nodule, no clear hypermetabolism.  She presents now for further evaluation.  Bronchoscopy reviewed with the patient, all questions answered.  Risks, benefits discussed and she agrees to proceed.  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  If the case needs to be done under LMA then we will make that adjustment.  Discussed with anesthesia.  PLan: Navigational bronchoscopy to hopefully approach the medial left lung mass, possibly also navigate to the right upper lobe nodule.  Baltazar Apo, MD, PhD 03/06/2020, 11:22 AM [facility] [phone] or if no answer [phone]

## 2020-03-07 LAB — CYTOLOGY - NON PAP

## 2020-03-07 LAB — SURGICAL PATHOLOGY

## 2020-03-08 ENCOUNTER — Telehealth: Payer: Self-pay | Admitting: Emergency Medicine

## 2020-03-08 NOTE — Telephone Encounter (Signed)
I notified pt of the results - LUL mass consistent with squamous cell CA. I presume from the tongue cancer. She understands the information. She will follow up with Drs Delton Coombes and Isidore Moos to coordinate next steps.   Will forward to them [facility]

## 2020-03-12 ENCOUNTER — Other Ambulatory Visit: Payer: Self-pay

## 2020-03-12 ENCOUNTER — Ambulatory Visit
Admission: RE | Admit: 2020-03-12 | Discharge: 2020-03-12 | Disposition: A | Discharge status: Home / Self Care | Payer: BC Managed Care – PPO | Source: Ambulatory Visit | Attending: Radiation Oncology | Admitting: Radiation Oncology

## 2020-03-12 DIAGNOSIS — Z923 Personal history of irradiation: Secondary | ICD-10-CM | POA: Insufficient documentation

## 2020-03-12 DIAGNOSIS — C023 Malignant neoplasm of anterior two-thirds of tongue, part unspecified: Secondary | ICD-10-CM | POA: Insufficient documentation

## 2020-03-12 DIAGNOSIS — R918 Other nonspecific abnormal finding of lung field: Secondary | ICD-10-CM | POA: Insufficient documentation

## 2020-03-12 DIAGNOSIS — Z510 Encounter for antineoplastic radiation therapy: Secondary | ICD-10-CM | POA: Diagnosis present

## 2020-03-14 ENCOUNTER — Inpatient Hospital Stay (HOSPITAL_COMMUNITY): Payer: BC Managed Care – PPO | Attending: Hematology | Admitting: Hematology

## 2020-03-14 ENCOUNTER — Other Ambulatory Visit: Payer: Self-pay

## 2020-03-14 VITALS — BP 127/62 | HR 76 | Temp 96.9°F | Resp 18 | Wt 142.0 lb

## 2020-03-14 DIAGNOSIS — C023 Malignant neoplasm of anterior two-thirds of tongue, part unspecified: Secondary | ICD-10-CM | POA: Diagnosis present

## 2020-03-14 DIAGNOSIS — M1990 Unspecified osteoarthritis, unspecified site: Secondary | ICD-10-CM | POA: Diagnosis not present

## 2020-03-14 DIAGNOSIS — I10 Essential (primary) hypertension: Secondary | ICD-10-CM | POA: Insufficient documentation

## 2020-03-14 DIAGNOSIS — K219 Gastro-esophageal reflux disease without esophagitis: Secondary | ICD-10-CM | POA: Insufficient documentation

## 2020-03-14 DIAGNOSIS — Z9221 Personal history of antineoplastic chemotherapy: Secondary | ICD-10-CM | POA: Diagnosis not present

## 2020-03-14 DIAGNOSIS — Z79899 Other long term (current) drug therapy: Secondary | ICD-10-CM | POA: Diagnosis not present

## 2020-03-14 DIAGNOSIS — B370 Candidal stomatitis: Secondary | ICD-10-CM | POA: Diagnosis not present

## 2020-03-14 NOTE — Patient Instructions (Addendum)
[place] at [facility] Discharge Instructions  You were seen today by Dr. Delton Coombes. He went over your recent results and scans. The possible treatment options were discussed today, including radiation and chemotherapy. Dr. Delton Coombes will see you back in 10 weeks for labs and follow up.   Thank you for choosing [place] at [facility] to provide your oncology and hematology care.  To afford each patient quality time with our provider, please arrive at least 15 minutes before your scheduled appointment time.   If you have a lab appointment with the [facility] please come in thru the Main Entrance and check in at the main information desk  You need to re-schedule your appointment should you arrive 10 or more minutes late.  We strive to give you quality time with our providers, and arriving late affects you and other patients whose appointments are after yours.  Also, if you no show three or more times for appointments you may be dismissed from the clinic at the providers discretion.     Again, thank you for choosing [facility].  Our hope is that these requests will decrease the amount of time that you wait before being seen by our physicians.       _____________________________________________________________  Should you have questions after your visit to [facility], please contact our office at [phone] between the hours of 8:00 a.m. and 4:30 p.m.  Voicemails left after 4:00 p.m. will not be returned until the following business day.  For prescription refill requests, have your pharmacy contact our office and allow 72 hours.    Cancer Center Support Programs:   > Cancer Support Group  2nd Tuesday of the month 1pm-2pm, Journey Room

## 2020-03-14 NOTE — Progress Notes (Signed)
Terri [name], Terri [name] [id number]   CLINIC:  Medical Oncology/Hematology  PCP:  Terri Naegeli, DO Terri [name] / [place]ri [name] [address] [phone]   REASON FOR VISIT:  Follow-up for tongue cancer  PRIOR THERAPY:  1. Right partial glossectomy & neck dissection on 05/13/2019 2. Aloxi and cisplatin from 06/27/2019 through 07/25/2019  CURRENT THERAPY: Radiation therapy to the left lung lesion.  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 (Terri [name])   05/30/2019 Cancer Staging   Staging form: Oral Cavity, AJCC 8th Edition - Clinical: Stage IVB (cT4a, cN3b, cM0) - Signed by Derek Jack, MD on 05/30/2019   06/07/2019 Cancer Staging   Staging form: Oral Cavity, AJCC 8th Edition - Pathologic stage from 06/07/2019: Stage IVB (pT4a, pN3b, cM0) - Signed by Eppie Gibson, MD on 06/07/2019   06/27/2019 -  Chemotherapy   The patient had palonosetron (ALOXI) injection 0.25 mg, 0.25 mg, Intravenous,  Once, 6 of 7 cycles Administration: 0.25 mg (06/27/2019), 0.25 mg (07/04/2019), 0.25 mg (07/11/2019), 0.25 mg (07/18/2019), 0.25 mg (07/25/2019) CISplatin (PLATINOL) 79 mg in sodium chloride 0.9 % 250 mL chemo infusion, 40 mg/m2 = 79 mg, Intravenous,  Once, 6 of 7 cycles Dose modification: 32 mg/m2 (80 % of original dose 40 mg/m2, Cycle 4, Reason: Other (see comments), Comment: weight loss, low wbc), 32 mg/m2 (original dose 40 mg/m2, Cycle 5, Reason: Treatment Parameters Not Met, Comment: ANC 1.4) Administration: 79 mg (06/27/2019), 79 mg (07/04/2019), 79 mg (07/11/2019), 63 mg (07/18/2019), 63 mg (07/25/2019) fosaprepitant (EMEND) 150 mg, dexamethasone (DECADRON) 12 mg in sodium chloride 0.9 % 145 mL IVPB, , Intravenous,  Once, 6 of 7 cycles Administration:  (06/27/2019),  (07/04/2019),  (07/11/2019),   (07/18/2019),  (07/25/2019)  for chemotherapy treatment.      CANCER STAGING: Cancer Staging Cancer of lateral margin of anterior two-thirds of tongue (Terri [name]) Staging form: Oral Cavity, AJCC 8th Edition - Clinical: Stage IVB (cT4a, cN3b, cM0) - Signed by Derek Jack, MD on 05/30/2019 - Pathologic stage from 06/07/2019: Stage IVB (pT4a, pN3b, cM0) - Signed by Eppie Gibson, MD on 06/07/2019   INTERVAL HISTORY:  [name], a 69 y.o. female, returns for routine follow-up of her tongue cancer. Terri [name] was last seen on 02/15/2020.  Today she reports that she saw Dr. Isidore Moos on 03/12/2020 and discussed the radiation regimen of treating both lungs, consisting of 5 sessions per side plus 5 additional sessions on the left side. She starts her radiation treatments on 03/21/2020. She reports that her tongue has improved since her surgery. She is drinking 7 cans of Boost, but she is starting to incorporate more solids into her diet. Her strength has returned to normal and denies dyspnea.   REVIEW OF SYSTEMS:  Review of Systems  Constitutional: Positive for appetite change (severely decreased) and fatigue (mild).  Respiratory: Negative for shortness of breath.   All other systems reviewed and are negative.   PAST MEDICAL/SURGICAL HISTORY:  Past Medical History:  Diagnosis Date  . Arthritis   . Cancer (HCC)    tongue cancer  . GERD (gastroesophageal reflux disease)   . History of radiation therapy 06/27/19- 08/05/19   HN tongue, 30 fractions of 2 Gy each to total 60 Gy.   [name] Hypertension   . LBBB (left bundle branch block)   .  Port-A-Cath in place 06/14/2019  . Wears glasses    or contact lenses   Past Surgical History:  Procedure Laterality Date  . BRONCHIAL BIOPSY  03/06/2020   Procedure: BRONCHIAL BIOPSIES;  Surgeon: Collene Gobble, MD;  Location: [facility] ENDOSCOPY;  Service: Pulmonary;;  . BRONCHIAL BRUSHINGS  03/06/2020   Procedure: BRONCHIAL BRUSHINGS;  Surgeon: Collene Gobble, MD;   Location[facility] ENDOSCOPY;  Service: Pulmonary;;  . BRONCHIAL NEEDLE ASPIRATION BIOPSY  03/06/2020   Procedure: BRONCHIAL NEEDLE ASPIRATION BIOPSIES;  Surgeon: Collene Gobble, MD;  Location: [place];  Service: Pulmonary;;  . BRONCHIAL WASHINGS  03/06/2020   Procedure: BRONCHIAL WASHINGS;  Surgeon: Collene Gobble, MD;  Location: [facility] ENDOSCOPY;  Service: Pulmonary;;  . COLONOSCOPY W/ BIOPSIES AND POLYPECTOMY    . 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 Dr. Nicolette Bang at [facility]  . PORTACATH PLACEMENT Left 06/10/2019   Procedure: INSERTION PORT-A-CATH;  Surgeon: Aviva Signs, MD;  Location: [facility] ORS;  Service: General;  Laterality: Left;  pt knows to arrive at 6:15  . TUBAL LIGATION    . VIDEO BRONCHOSCOPY WITH ENDOBRONCHIAL NAVIGATION N/A 03/06/2020   Procedure: VIDEO BRONCHOSCOPY WITH ENDOBRONCHIAL NAVIGATION;  Surgeon: Collene Gobble, MD;  Location: [facility] ENDOSCOPY;  Service: Pulmonary;  Laterality: N/A;    SOCIAL HISTORY:  Social History   Socioeconomic History  . Marital status: Widowed    Spouse name: Not on file  . Number of children: 2  . Years of education: Not on file  . Highest education level: Not on file  Occupational History  . Not on file  Tobacco Use  . Smoking status: Never Smoker  . Smokeless tobacco: Never Used  Substance and Sexual Activity  . Alcohol use: Never  . Drug use: Never  . Sexual activity: Not Currently  Other Topics Concern  . Not on file  Social History Narrative      Patient is a widow.   Patient lives with her 2 sons in [place], [place].   Patient is a non-smoker, nondrinker.  Patient has never used chew.  Patient does not use illicit drugs.   Social Determinants of Health   Financial Resource Strain: Low Risk   . Difficulty of Paying Living Expenses: Not hard at all  Food Insecurity: No Food Insecurity  . Worried About Charity fundraiser in the Last Year: Never  true  . Ran Out of Food in the Last Year: Never true  Transportation Needs: No Transportation Needs  . Lack of Transportation (Medical): No  . Lack of Transportation (Non-Medical): No  Physical Activity: Inactive  . Days of Exercise per Week: 0 days  . Minutes of Exercise per Session: 0 min  Stress: No Stress Concern Present  . Feeling of Stress : Only a little  Social Connections: Slightly Isolated  . Frequency of Communication with Friends and Family: More than three times a week  . Frequency of Social Gatherings with Friends and Family: More than three times a week  . Attends Religious Services: More than 4 times per year  . Active Member of Clubs or Organizations: Yes  . Attends Archivist Meetings: More than 4 times per year  . Marital Status: Widowed  Intimate Partner Violence: Not At Risk  . Fear of Current or Ex-Partner: No  . Emotionally Abused: No  . Physically Abused: No  . Sexually Abused: No    FAMILY HISTORY:  Family History  Problem Relation Age of Onset  . Heart failure Mother   . Heart failure Father     CURRENT MEDICATIONS:  Current Outpatient Medications  Medication Sig Dispense Refill  . DENTA 5000 PLUS 1.1 % CREA dental cream Place 1 application onto teeth at bedtime.     . fluconazole (DIFLUCAN) 100 MG tablet Take 1-2 tablets (100-200 mg total) by mouth See admin instructions. Take 200 mg on day 1 then take 100 mg everyday for 13 days    . OVER THE COUNTER MEDICATION Take 1 tablet by mouth daily. VS-C supplement    . Probiotic CAPS Take 1 capsule by mouth daily.    [name] acetaminophen (TYLENOL) 650 MG CR tablet Take 1,300 mg by mouth every 8 (eight) hours as needed for pain.    [name] lidocaine-prilocaine (EMLA) cream Apply 1 application topically daily as needed (port access). (Patient not taking: Reported on 03/14/2020)     No current facility-administered medications for this visit.   Facility-Administered Medications Ordered in Other Visits    Medication Dose Route Frequency Provider Last Rate Last Admin  . heparin lock flush 100 unit/mL  500 Units Intracatheter Once PRN Derek Jack, MD      . sodium chloride 0.9 % 1,000 mL with potassium chloride 20 mEq, magnesium sulfate 2 g infusion   Intravenous Once Derek Jack, MD      . sodium chloride flush (NS) 0.9 % injection 10 mL  10 mL Intracatheter Once PRN Derek Jack, MD        ALLERGIES:  No Known Allergies  PHYSICAL EXAM:  Performance status (ECOG): 0 - Asymptomatic  Vitals:   03/14/20 1558  BP: 127/62  Pulse: 76  Resp: 18  Temp: (!) 96.9 F (36.1 C)  SpO2: 100%   Wt Readings from Last 3 Encounters:  03/14/20 142 lb (64.4 kg)  03/06/20 143 lb (64.9 kg)  02/22/20 143 lb (64.9 kg)   Physical Exam Vitals reviewed.  Constitutional:      Appearance: Normal appearance.  HENT:     Mouth/Throat:     Comments: Thrush Cardiovascular:     Rate and Rhythm: Normal rate and regular rhythm.     Pulses: Normal pulses.     Heart sounds: Normal heart sounds.  Pulmonary:     Effort: Pulmonary effort is normal.     Breath sounds: Normal breath sounds.  Abdominal:     Palpations: Abdomen is soft. There is no mass.     Tenderness: There is no abdominal tenderness.  Musculoskeletal:     Right lower leg: No edema.     Left lower leg: No edema.  Lymphadenopathy:     Head:     Right side of head: No submental or submandibular adenopathy.     Left side of head: No submental or submandibular adenopathy.     Cervical: No cervical adenopathy.     Upper Body:     Right upper body: No supraclavicular adenopathy.     Left upper body: No supraclavicular adenopathy.  Neurological:     General: No focal deficit present.     Mental Status: She is alert and oriented to person, place, and time.  Psychiatric:        Mood and Affect: Mood normal.        Behavior: Behavior normal.      LABORATORY DATA:  I have reviewed the labs as listed.  CBC Latest  Ref Rng & Units 03/02/2020 11/07/2019 09/28/2019  WBC  4.0 - 10.5 K/uL 2.6(L) 2.6(L) 2.6(L)  Hemoglobin 12.0 - 15.0 g/dL 14.2 11.4(L) 12.5  Hematocrit 36.0 - 46.0 % 41.5 35.3(L) 38.8  Platelets 150.0 - 400.0 K/uL 182.0 147(L) 154   CMP Latest Ref Rng & Units 03/02/2020 11/07/2019 09/28/2019  Glucose 70 - 99 mg/dL 87 99 105(H)  BUN 6 - 23 mg/dL 36(H) 16 17  Creatinine 0.40 - 1.20 mg/dL 1.00 0.95 1.14(H)  Sodium 135 - 145 mEq/L 133(L) 135 134(L)  Potassium 3.5 - 5.1 mEq/L 4.6 3.9 3.9  Chloride 96 - 112 mEq/L 98 102 97(L)  CO2 19 - 32 mEq/L _0 Calcium 8.4 - 10.5 mg/dL 10.3 9.2 9.5  Total Protein 6.0 - 8.3 g/dL 8.1 6.8 7.3  Total Bilirubin 0.2 - 1.2 mg/dL 0.3 0.6 0.6  Alkaline Phos 39 - 117 U/L 69 48 48  AST 0 - 37 U/L _1 ALT 0 - 35 U/L _2 Cytology Non-PAP of LUL lung lavage on 03/06/2020 ([id number])  FINAL MICROSCOPIC DIAGNOSIS:  - No malignant cells identified  Cytology Non-PAP of RUL lung lavage on 03/06/2020 ([id number])  FINAL MICROSCOPIC DIAGNOSIS:  - Atypical cells present    DIAGNOSTIC IMAGING:  I have independently reviewed the scans and discussed with the patient. NM PET Image Restag (PS) Skull Base To Thigh  Result Date: 02/14/2020 CLINICAL DATA:  Subsequent treatment strategy for tongue cancer. EXAM: NUCLEAR MEDICINE PET SKULL BASE TO THIGH TECHNIQUE: 9.02 mCi F-18 FDG was injected intravenously. Full-ring PET imaging was performed from the skull base to thigh after the radiotracer. CT data was obtained and used for attenuation correction and anatomic localization. Fasting blood glucose: 71 mg/dl COMPARISON:  CT neck dated 11/12/2019.  PET-CT dated 11/04/2019. FINDINGS: Mediastinal blood pool activity: SUV max 2.2 Liver activity: SUV max NA NECK: Bilateral but asymmetric hypermetabolism involving the right > left base of tongue, regional/geographic and non masslike, max SUV 7.3 (previously 4.3). This appearance is favored to be physiologic/reactive.  Associated linear hypermetabolism involving the right digastric muscle, extending from the styloid to the right base of tongue, max SUV 16.2. Although unusual, this favors physiologic muscular activity rather than nodal metastasis. Ill-defined 9 mm short axis right level 1B node (series 3/image 95), max SUV 8.6, previously 5.8. This remains concerning for nodal metastasis. Incidental CT findings: none CHEST: 2.1 cm short axis superior mediastinal/prevascular node (series 3/image 127), previously 8 mm, max SUV 13.2. No suspicious pulmonary nodules. Incidental CT findings: Atherosclerotic calcifications of the aortic arch. Aberrant right subclavian artery. Left chest port terminates in the lower SVC. ABDOMEN/PELVIS: No abnormal hypermetabolism in the liver, spleen, pancreas, or adrenal glands. No hypermetabolic abdominopelvic lymphadenopathy. Incidental CT findings: Mild atherosclerotic calcifications the abdominal aorta. Left colonic diverticulosis, without evidence of diverticulitis. SKELETON: No focal hypermetabolic activity to suggest skeletal metastasis. Incidental CT findings: Mild degenerative changes of the thoracolumbar spine. IMPRESSION: Hypermetabolic 9 mm short axis right level 1B node, mildly progressive, concerning for nodal metastasis. 2.1 cm short axis superior mediastinal/prevascular node, markedly progressive, concerning for thoracic nodal metastasis. Asymmetric hypermetabolism involving the right base of tongue, likely reactive. Associated linear metabolism involving the right digastric muscle, unusual but favored to be physiologic/reactive. Electronically Signed   By: Julian Hy M.D.   On: 02/14/2020 11:19   DG CHEST PORT 1 VIEW  Result Date: 03/06/2020 CLINICAL DATA:  Post bronch. EXAM: PORTABLE CHEST 1 VIEW COMPARISON:  CT chest 02/27/2020.  Chest x-ray 06/10/2019. FINDINGS: PowerPort catheter with tip over  superior vena cava. Left upper lobe mass/infiltrate again noted. No pleural  effusion or pneumothorax. Elevation left hemithorax again noted. Degenerative change thoracic spine. IMPRESSION: 1.  PowerPort catheter with tip over superior vena cava. 2. Left upper lobe mass/infiltrate again noted. No pleural effusion or pneumothorax. 3.  Elevation left hemidiaphragm again noted. Electronically Signed   By: Marcello Moores  Register   On: 03/06/2020 13:52   CT Super D Chest Wo Contrast  Result Date: 02/27/2020 CLINICAL DATA:  Lung nodule. Pre navigational bronchoscopy. 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 EXAM: CT CHEST WITHOUT CONTRAST TECHNIQUE: Multidetector CT imaging of the chest was performed using thin slice collimation for electromagnetic bronchoscopy planning purposes, without intravenous contrast. COMPARISON:  PET 02/13/2020, 11/04/2019 and CT neck 11/11/2019. FINDINGS: Cardiovascular: Left IJ Port-A-Cath terminates in the low SVC. Aberrant right subclavian artery. Atherosclerotic calcification of the aorta. Heart is at the upper limits of normal in size. No pericardial effusion. Mediastinum/Nodes: No pathologically enlarged mediastinal or axillary lymph nodes. Hilar regions are difficult to evaluate without IV contrast. Esophagus is grossly unremarkable. Lungs/Pleura: Medial left upper lobe mass invades the mediastinum, measures 2.2 x 3.1 cm (4/47) and is stable in size from 02/13/2020. Irregular right upper lobe nodule is seen peripherally, measuring 8 x 10 mm (4/46), also stable from 02/13/2020. Lungs are otherwise clear. No pleural fluid. Airway is unremarkable. Upper Abdomen: Visualized portions of the liver and gallbladder are unremarkable. There may be mild thickening of both adrenal glands. Visualized portions of the kidneys, spleen, pancreas, stomach and bowel are otherwise unremarkable. Left hemidiaphragm is elevated, stable from 02/13/2020 but new from 11/04/2019. Musculoskeletal: Degenerative changes in the spine. No  worrisome lytic or sclerotic lesions. IMPRESSION: 1. Left upper lobe mass and right upper lobe nodule may represent metastatic squamous cell carcinoma or synchronous primary bronchogenic carcinomas. 2. Left upper lobe mass invades the mediastinum and likely involves the left phrenic nerve given left hemidiaphragm elevation, new from 11/04/2019. 3.  Aortic atherosclerosis (ICD10-I70.0). Electronically Signed   By: Lorin Picket M.D.   On: 02/27/2020 12:31   DG C-ARM BRONCHOSCOPY  Result Date: 03/06/2020 C-ARM BRONCHOSCOPY: Fluoroscopy was utilized by the requesting physician.  No radiographic interpretation.     ASSESSMENT:  1.  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 Nicolette Bang -Pathology showed 4.2 cm tumor with margins negative, 17 mm depth of invasion, moderately to poorly differentiated, positive neural invasion, 2/17 lymph nodes positive, ECE positive, PT 4 APN 3B. -IMRT with weekly cisplatin from 06/27/2019 through 07/25/2019. -PET scan on 11/04/2019 showed hypermetabolic level 1B lymph node in the right submental region measuring 5 mm.  Left upper lobe pulmonary nodule measuring 8 mm. -PET scan on 02/13/2020 with ill-defined 9 mm short axis level 1B node with SUV 8.6, previously 5.6 SUV.  2.1 cm short axis superior mediastinal/prevascular node previously 8 mm SUV 13.2. -Bronchoscopy on 03/06/2020 with biopsy of the left lung lesion consistent with squamous cell carcinoma.  Right lung lesion shows atypical cells.   PLAN:  1.  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  All her questions were answered.  I had discussed this case with Dr. Isidore Moos and Dr. Nicolette Bang.  Options included systemic therapy versus radiation to the lung lesion. -She already met with  Dr.  Squire and had simulation scans done.  I will also reach out to Dr. Isidore Moos to confirm the sites of radiation. -She is planning to have treatment to the left lung lesion as well as the right lung area close to the pleura. -I think it is reasonable to treat with radiation and use systemic therapy upon relapse. -I will see her back in 10 weeks for follow-up.  I plan to schedule her scans at that time.  2.  Nutrition: -She is drinking 6 to 7 cans of boost/Ensure per day.  Not able to eat any solid foods.  3.  Oral thrush: -She has recurrent thrush on the tongue which is refractory to Diflucan. -She is using local measures which is helping.   Orders placed this encounter:  No orders of the defined types were placed in this encounter.  Total time spent is 40 minutes with more than 50% of the time spent face-to-face discussing and reviewing scans, pathology reports, treatment plan, counseling and coordination of care  Derek Jack, MD [place] [phone]   I, Milinda Antis, am acting as a scribe for Dr. Sanda Linger.  I, Derek Jack MD, have reviewed the above documentation for accuracy and completeness, and I agree with the above.

## 2020-03-16 ENCOUNTER — Encounter (HOSPITAL_COMMUNITY): Payer: Self-pay | Admitting: *Deleted

## 2020-03-16 NOTE — Progress Notes (Signed)
Talked with patient via telephone.  Explained rationale for radiation to both right and left lung per Dr. Isidore Moos and Dr. Delton Coombes.  Patient verbalizes that she has a better understanding at this time.

## 2020-03-19 ENCOUNTER — Ambulatory Visit: Payer: BC Managed Care – PPO | Admitting: Radiation Oncology

## 2020-03-19 ENCOUNTER — Ambulatory Visit: Payer: BC Managed Care – PPO | Admitting: Internal Medicine

## 2020-03-19 NOTE — Progress Notes (Deleted)
   Reviewed notes, patient has plan in place, she can f/u with [place] on a PRN basis, told her she does not need to drive all the way down here today.  Erskine Emery MD PCCM

## 2020-03-20 ENCOUNTER — Ambulatory Visit: Payer: BC Managed Care – PPO | Admitting: Radiation Oncology

## 2020-03-20 DIAGNOSIS — R918 Other nonspecific abnormal finding of lung field: Secondary | ICD-10-CM | POA: Diagnosis not present

## 2020-03-21 ENCOUNTER — Ambulatory Visit: Payer: BC Managed Care – PPO | Admitting: Radiation Oncology

## 2020-03-21 ENCOUNTER — Ambulatory Visit
Admission: RE | Admit: 2020-03-21 | Discharge: 2020-03-21 | Disposition: A | Discharge status: Home / Self Care | Payer: BC Managed Care – PPO | Source: Ambulatory Visit | Attending: Radiation Oncology | Admitting: Radiation Oncology

## 2020-03-21 ENCOUNTER — Other Ambulatory Visit: Payer: Self-pay

## 2020-03-22 ENCOUNTER — Ambulatory Visit: Payer: BC Managed Care – PPO | Admitting: Radiation Oncology

## 2020-03-22 ENCOUNTER — Other Ambulatory Visit: Payer: Self-pay

## 2020-03-22 ENCOUNTER — Ambulatory Visit
Admission: RE | Admit: 2020-03-22 | Discharge: 2020-03-22 | Disposition: A | Discharge status: Home / Self Care | Payer: BC Managed Care – PPO | Source: Ambulatory Visit | Attending: Radiation Oncology | Admitting: Radiation Oncology

## 2020-03-22 DIAGNOSIS — R918 Other nonspecific abnormal finding of lung field: Secondary | ICD-10-CM | POA: Diagnosis not present

## 2020-03-23 ENCOUNTER — Ambulatory Visit
Admission: RE | Admit: 2020-03-23 | Discharge: 2020-03-23 | Disposition: A | Discharge status: Home / Self Care | Payer: BC Managed Care – PPO | Source: Ambulatory Visit | Attending: Radiation Oncology | Admitting: Radiation Oncology

## 2020-03-23 ENCOUNTER — Other Ambulatory Visit: Payer: Self-pay

## 2020-03-26 ENCOUNTER — Other Ambulatory Visit: Payer: Self-pay

## 2020-03-26 ENCOUNTER — Ambulatory Visit
Admission: RE | Admit: 2020-03-26 | Discharge: 2020-03-26 | Disposition: A | Discharge status: Home / Self Care | Payer: BC Managed Care – PPO | Source: Ambulatory Visit | Attending: Radiation Oncology | Admitting: Radiation Oncology

## 2020-03-26 DIAGNOSIS — R918 Other nonspecific abnormal finding of lung field: Secondary | ICD-10-CM | POA: Diagnosis not present

## 2020-03-27 ENCOUNTER — Ambulatory Visit
Admission: RE | Admit: 2020-03-27 | Discharge: 2020-03-27 | Disposition: A | Discharge status: Home / Self Care | Payer: BC Managed Care – PPO | Source: Ambulatory Visit | Attending: Radiation Oncology | Admitting: Radiation Oncology

## 2020-03-27 ENCOUNTER — Other Ambulatory Visit: Payer: Self-pay

## 2020-03-27 DIAGNOSIS — R918 Other nonspecific abnormal finding of lung field: Secondary | ICD-10-CM | POA: Diagnosis not present

## 2020-03-28 ENCOUNTER — Other Ambulatory Visit: Payer: Self-pay

## 2020-03-28 ENCOUNTER — Ambulatory Visit
Admission: RE | Admit: 2020-03-28 | Discharge: 2020-03-28 | Disposition: A | Discharge status: Home / Self Care | Payer: BC Managed Care – PPO | Source: Ambulatory Visit | Attending: Radiation Oncology | Admitting: Radiation Oncology

## 2020-03-28 DIAGNOSIS — R918 Other nonspecific abnormal finding of lung field: Secondary | ICD-10-CM | POA: Diagnosis not present

## 2020-03-29 ENCOUNTER — Other Ambulatory Visit: Payer: Self-pay

## 2020-03-29 ENCOUNTER — Ambulatory Visit
Admission: RE | Admit: 2020-03-29 | Discharge: 2020-03-29 | Disposition: A | Discharge status: Home / Self Care | Payer: BC Managed Care – PPO | Source: Ambulatory Visit | Attending: Radiation Oncology | Admitting: Radiation Oncology

## 2020-03-29 DIAGNOSIS — R918 Other nonspecific abnormal finding of lung field: Secondary | ICD-10-CM | POA: Diagnosis not present

## 2020-03-30 ENCOUNTER — Ambulatory Visit
Admission: RE | Admit: 2020-03-30 | Discharge: 2020-03-30 | Disposition: A | Discharge status: Home / Self Care | Payer: BC Managed Care – PPO | Source: Ambulatory Visit | Attending: Radiation Oncology | Admitting: Radiation Oncology

## 2020-03-30 ENCOUNTER — Other Ambulatory Visit: Payer: Self-pay

## 2020-03-30 DIAGNOSIS — R918 Other nonspecific abnormal finding of lung field: Secondary | ICD-10-CM | POA: Diagnosis not present

## 2020-04-02 ENCOUNTER — Other Ambulatory Visit: Payer: Self-pay

## 2020-04-02 ENCOUNTER — Ambulatory Visit
Admission: RE | Admit: 2020-04-02 | Discharge: 2020-04-02 | Disposition: A | Discharge status: Home / Self Care | Payer: BC Managed Care – PPO | Source: Ambulatory Visit | Attending: Radiation Oncology | Admitting: Radiation Oncology

## 2020-04-02 DIAGNOSIS — R918 Other nonspecific abnormal finding of lung field: Secondary | ICD-10-CM | POA: Diagnosis not present

## 2020-04-03 ENCOUNTER — Encounter: Payer: Self-pay | Admitting: Radiation Oncology

## 2020-04-03 ENCOUNTER — Other Ambulatory Visit: Payer: Self-pay

## 2020-04-03 ENCOUNTER — Ambulatory Visit
Admission: RE | Admit: 2020-04-03 | Discharge: 2020-04-03 | Disposition: A | Discharge status: Home / Self Care | Payer: BC Managed Care – PPO | Source: Ambulatory Visit | Attending: Radiation Oncology | Admitting: Radiation Oncology

## 2020-04-03 DIAGNOSIS — R918 Other nonspecific abnormal finding of lung field: Secondary | ICD-10-CM | POA: Diagnosis not present

## 2020-05-03 ENCOUNTER — Telehealth: Payer: Self-pay

## 2020-05-03 ENCOUNTER — Telehealth: Payer: Self-pay | Admitting: *Deleted

## 2020-05-03 NOTE — Telephone Encounter (Signed)
I called the patient today about their upcoming follow-up appointment in radiation oncology.   Given the state of the  COVID-19 pandemic, concerning case numbers in our community, and guidance from [facility], I offered a phone assessment with the patient to determine if coming to the clinic was necessary. The patient accepted.  I let the patient know that I had spoken with Dr. Isidore Moos, and she wanted them to know the importance of washing their hands for at least 20 seconds at a time, especially after going out in public, and before they eat. Limit going out in public whenever possible. Do not touch your face, unless your hands are clean, such as when bathing. Get plenty of rest, eat well, and stay hydrated. Patient verbalized understanding and agreement.  Symptomatically, the patient is doing relatively well. She reports the swelling under chin has improved, and that she is diligent about massaging area daily. She denies any coughing or shortness of breath. She is still dealing with dry mouth, but she knows that will take time to resolve. Overall she states she feels good, and thinks she's doing well since completing radiation.   All questions were answered to the patient's satisfaction.  I encouraged the patient to call with any further questions. Otherwise, the plan is F/U with Dr. Isidore Moos in August after her PET scan.    Patient is pleased with this plan, and we will cancel their upcoming follow-up to reduce the risk of COVID-19 transmission.

## 2020-05-03 NOTE — Telephone Encounter (Signed)
XXXX 

## 2020-05-03 NOTE — Telephone Encounter (Signed)
CALLED PATIENT TO INFORM OF NEW FU APPT. FOR 05-25-20 @ 3:40 PM WITH DR. Isidore Moos, SPOKE WITH PATIENT AND SHE AGREED TO THIS DAY AND TIME

## 2020-05-04 ENCOUNTER — Ambulatory Visit: Payer: BC Managed Care – PPO | Admitting: Radiation Oncology

## 2020-05-21 ENCOUNTER — Other Ambulatory Visit: Payer: Self-pay

## 2020-05-21 ENCOUNTER — Encounter (HOSPITAL_COMMUNITY)
Admission: RE | Admit: 2020-05-21 | Discharge: 2020-05-21 | Disposition: A | Discharge status: Home / Self Care | Payer: BC Managed Care – PPO | Source: Ambulatory Visit | Attending: Hematology | Admitting: Hematology

## 2020-05-21 ENCOUNTER — Inpatient Hospital Stay (HOSPITAL_COMMUNITY): Payer: BC Managed Care – PPO | Attending: Hematology

## 2020-05-21 DIAGNOSIS — B370 Candidal stomatitis: Secondary | ICD-10-CM | POA: Diagnosis not present

## 2020-05-21 DIAGNOSIS — Z9221 Personal history of antineoplastic chemotherapy: Secondary | ICD-10-CM | POA: Insufficient documentation

## 2020-05-21 DIAGNOSIS — I10 Essential (primary) hypertension: Secondary | ICD-10-CM | POA: Diagnosis not present

## 2020-05-21 DIAGNOSIS — C021 Malignant neoplasm of border of tongue: Secondary | ICD-10-CM

## 2020-05-21 DIAGNOSIS — C023 Malignant neoplasm of anterior two-thirds of tongue, part unspecified: Secondary | ICD-10-CM | POA: Diagnosis present

## 2020-05-21 DIAGNOSIS — C782 Secondary malignant neoplasm of pleura: Secondary | ICD-10-CM | POA: Insufficient documentation

## 2020-05-21 DIAGNOSIS — K219 Gastro-esophageal reflux disease without esophagitis: Secondary | ICD-10-CM | POA: Insufficient documentation

## 2020-05-21 DIAGNOSIS — Z8249 Family history of ischemic heart disease and other diseases of the circulatory system: Secondary | ICD-10-CM | POA: Insufficient documentation

## 2020-05-21 DIAGNOSIS — Z923 Personal history of irradiation: Secondary | ICD-10-CM | POA: Diagnosis not present

## 2020-05-21 DIAGNOSIS — Z79899 Other long term (current) drug therapy: Secondary | ICD-10-CM | POA: Diagnosis not present

## 2020-05-21 LAB — CBC WITH DIFFERENTIAL/PLATELET
Abs Immature Granulocytes: 0.01 10*3/uL (ref 0.00–0.07)
Basophils Absolute: 0 10*3/uL (ref 0.0–0.1)
Basophils Relative: 0 %
Eosinophils Absolute: 0.1 10*3/uL (ref 0.0–0.5)
Eosinophils Relative: 3 %
HCT: 42.2 % (ref 36.0–46.0)
Hemoglobin: 13.1 g/dL (ref 12.0–15.0)
Immature Granulocytes: 0 %
Lymphocytes Relative: 11 %
Lymphs Abs: 0.3 10*3/uL — ABNORMAL LOW (ref 0.7–4.0)
MCH: 28.7 pg (ref 26.0–34.0)
MCHC: 31 g/dL (ref 30.0–36.0)
MCV: 92.5 fL (ref 80.0–100.0)
Monocytes Absolute: 0.3 10*3/uL (ref 0.1–1.0)
Monocytes Relative: 11 %
Neutro Abs: 2.1 10*3/uL (ref 1.7–7.7)
Neutrophils Relative %: 75 %
Platelets: 161 10*3/uL (ref 150–400)
RBC: 4.56 MIL/uL (ref 3.87–5.11)
RDW: 13.2 % (ref 11.5–15.5)
WBC: 2.9 10*3/uL — ABNORMAL LOW (ref 4.0–10.5)
nRBC: 0 % (ref 0.0–0.2)

## 2020-05-21 LAB — COMPREHENSIVE METABOLIC PANEL
ALT: 21 U/L (ref 0–44)
AST: 23 U/L (ref 15–41)
Albumin: 4.1 g/dL (ref 3.5–5.0)
Alkaline Phosphatase: 59 U/L (ref 38–126)
Anion gap: 8 (ref 5–15)
BUN: 34 mg/dL — ABNORMAL HIGH (ref 8–23)
CO2: 28 mmol/L (ref 22–32)
Calcium: 9.6 mg/dL (ref 8.9–10.3)
Chloride: 102 mmol/L (ref 98–111)
Creatinine, Ser: 0.83 mg/dL (ref 0.44–1.00)
GFR calc Af Amer: >60 mL/min (ref >60)
GFR calc non Af Amer: >60 mL/min (ref >60)
Glucose, Bld: 92 mg/dL (ref 70–99)
Potassium: 4.6 mmol/L (ref 3.5–5.1)
Sodium: 138 mmol/L (ref 135–145)
Total Bilirubin: 0.4 mg/dL (ref 0.3–1.2)
Total Protein: 7.6 g/dL (ref 6.5–8.1)

## 2020-05-21 LAB — MAGNESIUM: Magnesium: 2.1 mg/dL (ref 1.7–2.4)

## 2020-05-21 MED ORDER — FLUDEOXYGLUCOSE F - 18 (FDG) INJECTION
8.4000 | Freq: Once | INTRAVENOUS | Status: AC | PRN
  Administered 2020-05-21: 8.54 via INTRAVENOUS

## 2020-05-23 ENCOUNTER — Encounter (HOSPITAL_COMMUNITY): Payer: Self-pay | Admitting: Lab

## 2020-05-23 ENCOUNTER — Other Ambulatory Visit: Payer: Self-pay

## 2020-05-23 ENCOUNTER — Inpatient Hospital Stay (HOSPITAL_BASED_OUTPATIENT_CLINIC_OR_DEPARTMENT_OTHER): Payer: BC Managed Care – PPO | Admitting: Hematology

## 2020-05-23 ENCOUNTER — Ambulatory Visit: Payer: Self-pay | Admitting: Radiation Oncology

## 2020-05-23 VITALS — BP 135/75 | HR 93 | Temp 96.9°F | Resp 18 | Wt 138.5 lb

## 2020-05-23 DIAGNOSIS — R6884 Jaw pain: Secondary | ICD-10-CM

## 2020-05-23 DIAGNOSIS — C023 Malignant neoplasm of anterior two-thirds of tongue, part unspecified: Secondary | ICD-10-CM | POA: Diagnosis not present

## 2020-05-23 DIAGNOSIS — C021 Malignant neoplasm of border of tongue: Secondary | ICD-10-CM | POA: Diagnosis not present

## 2020-05-23 MED ORDER — FLUCONAZOLE 100 MG PO TABS
100.0000 mg | ORAL_TABLET | Freq: Every day | ORAL | 1 refills | Status: DC
Start: 2020-05-23 — End: 2020-07-10

## 2020-05-23 NOTE — Patient Instructions (Signed)
[place] at [facility] Discharge Instructions  You were seen today by Dr. Delton Coombes. He went over your recent results and scans. You will be scheduled for a CT scan of your neck. You will be referred back to Dr. Nicolette Bang for follow-up for your jaw pain. You will be prescribed diflucan to take daily for 1 month for your thrush. Dr. Delton Coombes will contact you via telephone after your CT scan for follow up.   Thank you for choosing [place] [facility] to provide your oncology and hematology care.  To afford each patient quality time with our provider, please arrive at least 15 minutes before your scheduled appointment time.   If you have a lab appointment with the [facility] please come in thru the Main Entrance and check in at the main information desk  You need to re-schedule your appointment should you arrive 10 or more minutes late.  We strive to give you quality time with our providers, and arriving late affects you and other patients whose appointments are after yours.  Also, if you no show three or more times for appointments you may be dismissed from the clinic at the providers discretion.     Again, thank you for choosing [facility].  Our hope is that these requests will decrease the amount of time that you wait before being seen by our physicians.       _____________________________________________________________  Should you have questions after your visit to [facility], please contact our office at [phone] between the hours of 8:00 a.m. and 4:30 p.m.  Voicemails left after 4:00 p.m. will not be returned until the following business day.  For prescription refill requests, have your pharmacy contact our office and allow 72 hours.    Cancer Center Support Programs:   > Cancer Support Group  2nd Tuesday of the month 1pm-2pm, Journey Room

## 2020-05-23 NOTE — Progress Notes (Addendum)
  Patient Name: Terri [name] MRN: [id number] DOB: [birth year] Referring Physician: [name] Date of Service: 04/03/2020 [facility], [place]                                                        End Of Treatment Note  Diagnoses: C34.11-Malignant neoplasm of upper lobe, right bronchus or lung C34.12-Malignant neoplasm of upper lobe, left bronchus or lung R91.8-Other nonspecific abnormal finding of lung field  (previous history of oral cavity cancer)  Cancer Staging:  STAGE IV  Intent: Curative  Radiation Treatment Dates: 03/21/2020 through 04/03/2020 Site Technique Total Dose (Gy) Dose per Fx (Gy) Completed Fx Beam Energies  Lung, Right: Lung_Rt IMRT 50/50 10 5/5 6XFFF  Lung, Left: Lung_Lt IMRT 50/50 5 10/10 6XFFF   Narrative: The patient tolerated radiation therapy relatively well.   Plan: The patient will follow-up with radiation oncology in one and half months after PET. -----------------------------------  Eppie Gibson, MD

## 2020-05-23 NOTE — Progress Notes (Unsigned)
Appt Dr Nicolette Bang / ENT/  appt 9/8 @2 .  Pt aware

## 2020-05-23 NOTE — Progress Notes (Signed)
Terri [name], Terri [name] [id number]   CLINIC:  Medical Oncology/Hematology  PCP:  [name], DO [place] / [name] [address] [phone]   REASON FOR VISIT:  Follow-up for tongue cancer  PRIOR THERAPY:  1. Right partial glossectomy & neck dissection on 05/13/2019. 2. Aloxi and cisplatin from 06/27/2019 through 07/25/2019. 3. Radiation therapy to left lung lesion from 03/21/2020 to 04/03/2020.  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 ([place])   05/30/2019 Cancer Staging   Staging form: Oral Cavity, AJCC 8th Edition - Clinical: Stage IVB (cT4a, cN3b, cM0) - Signed by Derek Jack, MD on 05/30/2019   06/07/2019 Cancer Staging   Staging form: Oral Cavity, AJCC 8th Edition - Pathologic stage from 06/07/2019: Stage IVB (pT4a, pN3b, cM0) - Signed by Eppie Gibson, MD on 06/07/2019   06/27/2019 -  Chemotherapy   The patient had palonosetron (ALOXI) injection 0.25 mg, 0.25 mg, Intravenous,  Once, 6 of 7 cycles Administration: 0.25 mg (06/27/2019), 0.25 mg (07/04/2019), 0.25 mg (07/11/2019), 0.25 mg (07/18/2019), 0.25 mg (07/25/2019) CISplatin (PLATINOL) 79 mg in sodium chloride 0.9 % 250 mL chemo infusion, 40 mg/m2 = 79 mg, Intravenous,  Once, 6 of 7 cycles Dose modification: 32 mg/m2 (80 % of original dose 40 mg/m2, Cycle 4, Reason: Other (see comments), Comment: weight loss, low wbc), 32 mg/m2 (original dose 40 mg/m2, Cycle 5, Reason: Treatment Parameters Not Met, Comment: ANC 1.4) Administration: 79 mg (06/27/2019), 79 mg (07/04/2019), 79 mg (07/11/2019), 63 mg (07/18/2019), 63 mg (07/25/2019) fosaprepitant (EMEND) 150 mg, dexamethasone (DECADRON) 12 mg in sodium chloride 0.9 % 145 mL IVPB, , Intravenous,  Once, 6 of 7  cycles Administration:  (06/27/2019),  (07/04/2019),  (07/11/2019),  (07/18/2019),  (07/25/2019)  for chemotherapy treatment.      CANCER STAGING: Cancer Staging Cancer of lateral margin of anterior two-thirds of tongue ([place]) Staging form: Oral Cavity, AJCC 8th Edition - Clinical: Stage IVB (cT4a, cN3b, cM0) - Signed by Derek Jack, MD on 05/30/2019 - Pathologic stage from 06/07/2019: Stage IVB (pT4a, pN3b, cM0) - Signed by Eppie Gibson, MD on 06/07/2019   INTERVAL HISTORY:  Terri [name], a 69 y.o. female, returns for routine follow-up of her tongue cancer. Ayame was last seen on 03/14/2020.  Today she is accompanied by her son. She complains of pain on the bottom part of her right jaw which started in July after finishing her radiation. It is intermittent and hurts like a toothache when it starts, lasting about 10 minutes at a time; it does not hurt when she touches it. She is drinking 5 cans of Boost daily and eats soft foods. She continues having thrush on her tongue even after finishing her Diflucan.  She does not have a follow-up appointment with Dr. Nicolette Bang.   REVIEW OF SYSTEMS:  Review of Systems  Constitutional: Positive for appetite change (depleted) and fatigue (mild).  HENT:   Positive for trouble swallowing (d/t pain).   Musculoskeletal: Positive for arthralgias (10/10 R jaw pain).  Psychiatric/Behavioral: Positive for sleep disturbance.  All other systems reviewed and are negative.   PAST MEDICAL/SURGICAL HISTORY:  Past Medical History:  Diagnosis Date  . Arthritis   . Cancer (HCC)    tongue cancer  . GERD (gastroesophageal reflux disease)   .  History of radiation therapy 06/27/19- 08/05/19   HN tongue, 30 fractions of 2 Gy each to total 60 Gy.   [name] Hypertension   . LBBB (left bundle branch block)   . Port-A-Cath in place 06/14/2019  . Wears glasses    or contact lenses   Past Surgical History:  Procedure Laterality Date  . BRONCHIAL BIOPSY  03/06/2020    Procedure: BRONCHIAL BIOPSIES;  Surgeon: Collene Gobble, MD;  Location: [facility] ENDOSCOPY;  Service: Pulmonary;;  . BRONCHIAL BRUSHINGS  03/06/2020   Procedure: BRONCHIAL BRUSHINGS;  Surgeon: Collene Gobble, MD;  Location: [facility] ENDOSCOPY;  Service: Pulmonary;;  . BRONCHIAL NEEDLE ASPIRATION BIOPSY  03/06/2020   Procedure: BRONCHIAL NEEDLE ASPIRATION BIOPSIES;  Surgeon: Collene Gobble, MD;  Location: [facility];  Service: Pulmonary;;  . BRONCHIAL WASHINGS  03/06/2020   Procedure: BRONCHIAL WASHINGS;  Surgeon: Collene Gobble, MD;  Location: [facility] ENDOSCOPY;  Service: Pulmonary;;  . COLONOSCOPY W/ BIOPSIES AND POLYPECTOMY    . 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 Dr. Nicolette Bang at [facility]  . PORTACATH PLACEMENT Left 06/10/2019   Procedure: INSERTION PORT-A-CATH;  Surgeon: Aviva Signs, MD;  Location: [facility] ORS;  Service: General;  Laterality: Left;  pt knows to arrive at 6:15  . TUBAL LIGATION    . VIDEO BRONCHOSCOPY WITH ENDOBRONCHIAL NAVIGATION N/A 03/06/2020   Procedure: VIDEO BRONCHOSCOPY WITH ENDOBRONCHIAL NAVIGATION;  Surgeon: Collene Gobble, MD;  Location: [facility] ENDOSCOPY;  Service: Pulmonary;  Laterality: N/A;    SOCIAL HISTORY:  Social History   Socioeconomic History  . Marital status: Widowed    Spouse name: Not on file  . Number of children: 2  . Years of education: Not on file  . Highest education level: Not on file  Occupational History  . Not on file  Tobacco Use  . Smoking status: Never Smoker  . Smokeless tobacco: Never Used  Vaping Use  . Vaping Use: Never used  Substance and Sexual Activity  . Alcohol use: Never  . Drug use: Never  . Sexual activity: Not Currently  Other Topics Concern  . Not on file  Social History Narrative      Patient is a widow.   Patient lives with her 2 sons in [place], [place].   Patient is a non-smoker, nondrinker.  Patient has never used chew.  Patient does not use  illicit drugs.   Social Determinants of Health   Financial Resource Strain:   . Difficulty of Paying Living Expenses:   Food Insecurity:   . Worried About Charity fundraiser in the Last Year:   . Arboriculturist in the Last Year:   Transportation Needs:   . Film/video editor (Medical):   [name] Lack of Transportation (Non-Medical):   Physical Activity:   . Days of Exercise per Week:   . Minutes of Exercise per Session:   Stress:   . Feeling of Stress :   Social Connections:   . Frequency of Communication with Friends and Family:   . Frequency of Social Gatherings with Friends and Family:   . Attends Religious Services:   . Active Member of Clubs or Organizations:   . Attends Archivist Meetings:   [name] Marital Status:   Intimate Partner Violence:   . Fear of Current or Ex-Partner:   . Emotionally Abused:   [name] Physically Abused:   . Sexually Abused:  FAMILY HISTORY:  Family History  Problem Relation Age of Onset  . Heart failure Mother   . Heart failure Father     CURRENT MEDICATIONS:  Current Outpatient Medications  Medication Sig Dispense Refill  . acetaminophen (TYLENOL) 650 MG CR tablet Take 1,300 mg by mouth every 8 (eight) hours as needed for pain.    . DENTA 5000 PLUS 1.1 % CREA dental cream Place 1 application onto teeth at bedtime.     . fluconazole (DIFLUCAN) 100 MG tablet Take 1-2 tablets (100-200 mg total) by mouth See admin instructions. Take 200 mg on day 1 then take 100 mg everyday for 13 days    . OVER THE COUNTER MEDICATION Take 1 tablet by mouth daily. VS-C supplement    . Probiotic CAPS Take 1 capsule by mouth daily.    [name] lidocaine-prilocaine (EMLA) cream Apply 1 application topically daily as needed (port access). (Patient not taking: Reported on 05/23/2020)     No current facility-administered medications for this visit.   Facility-Administered Medications Ordered in Other Visits  Medication Dose Route Frequency Provider Last Rate Last  Admin  . heparin lock flush 100 unit/mL  500 Units Intracatheter Once PRN Derek Jack, MD      . sodium chloride 0.9 % 1,000 mL with potassium chloride 20 mEq, magnesium sulfate 2 g infusion   Intravenous Once Derek Jack, MD      . sodium chloride flush (NS) 0.9 % injection 10 mL  10 mL Intracatheter Once PRN Derek Jack, MD        ALLERGIES:  No Known Allergies  PHYSICAL EXAM:  Performance status (ECOG): 0 - Asymptomatic  Vitals:   05/23/20 1434  BP: 135/75  Pulse: 93  Resp: 18  Temp: (!) 96.9 F (36.1 C)  SpO2: 100%   Wt Readings from Last 3 Encounters:  05/23/20 138 lb 8 oz (62.8 kg)  03/14/20 142 lb (64.4 kg)  03/06/20 143 lb (64.9 kg)   Physical Exam Vitals reviewed.  Constitutional:      Appearance: Normal appearance.  HENT:     Head:     Jaw: No tenderness or swelling.     Mouth/Throat:     Tongue: Lesions (thrush on anterior portion of tongue) present.  Chest:     Comments: Port-a-Cath in L chest Neurological:     General: No focal deficit present.     Mental Status: She is alert and oriented to person, place, and time.  Psychiatric:        Mood and Affect: Mood normal.        Behavior: Behavior normal.      LABORATORY DATA:  I have reviewed the labs as listed.  CBC Latest Ref Rng & Units 05/21/2020 03/02/2020 11/07/2019  WBC 4.0 - 10.5 K/uL 2.9(L) 2.6(L) 2.6(L)  Hemoglobin 12.0 - 15.0 g/dL 13.1 14.2 11.4(L)  Hematocrit 36 - 46 % 42.2 41.5 35.3(L)  Platelets 150 - 400 K/uL 161 182.0 147(L)   CMP Latest Ref Rng & Units 05/21/2020 03/02/2020 11/07/2019  Glucose 70 - 99 mg/dL 92 87 99  BUN 8 - 23 mg/dL 34(H) 36(H) 16  Creatinine 0.44 - 1.00 mg/dL 0.83 1.00 0.95  Sodium 135 - 145 mmol/L 138 133(L) 135  Potassium 3.5 - 5.1 mmol/L 4.6 4.6 3.9  Chloride 98 - 111 mmol/L 102 98 102  CO2 22 - 32 mmol/L '28 28 25  ' Calcium 8.9 - 10.3 mg/dL 9.6 10.3 9.2  Total Protein 6.5 - 8.1 g/dL 7.6 8.1  6.8  Total Bilirubin 0.3 - 1.2 mg/dL 0.4 0.3 0.6    Alkaline Phos 38 - 126 U/L 59 69 48  AST 15 - 41 U/L '23 23 20  ' ALT 0 - 44 U/L '21 19 15    ' DIAGNOSTIC IMAGING:  I have independently reviewed the scans and discussed with the patient. NM PET Image Restag (PS) Skull Base To Thigh  Result Date: 05/22/2020 CLINICAL DATA:  Subsequent treatment strategy for head neck cancer. Lingual carcinoma. EXAM: NUCLEAR MEDICINE PET SKULL BASE TO THIGH TECHNIQUE: 8.5 mCi F-18 FDG was injected intravenously. Full-ring PET imaging was performed from the skull base to thigh after the radiotracer. CT data was obtained and used for attenuation correction and anatomic localization. Fasting blood glucose: 89 mg/dl COMPARISON:  PET-CT 02/13/2020 FINDINGS: Mediastinal blood pool activity: SUV max 2.42 Liver activity: SUV max NA NECK: Increased metabolic activity in the RIGHT submental region at the prior site of the RIGHT level 1 lymph node described on comparison PET-CT scan. Activity is increased from SUV max 7.32 to SUV max 11.5. No clear lesion is identified on the CT portion exam. There is increased activity in the LEFT maxillary bone compared to prior. This is a broad region of intense activity extending midline posteriorly or 5 cm. Favor odontogenic in origin No additional lymph nodes in the neck identified. Incidental CT findings: none CHEST: Marked interval decrease in activity of the anterior mediastinal node/pleural lesion in the prevascular location with SUV max equal 5.7 decreased from 13.8. Soft tissue component measuring 2.1 cm (image 119/3) compares to 2.7 cm No additional hypermetabolic mediastinal lymph nodes. Peripheral nodule in the RIGHT upper lobe measuring 7 mm (image 118/3) similar to 8 mm on prior; however, nodule decreased in metabolic activity with SUV max equal 1.8 decreased from SUV max equal 3.2. No new pulmonary nodules Incidental CT findings: none ABDOMEN/PELVIS: No abnormal hypermetabolic activity within the liver, pancreas, adrenal glands, or  spleen. No hypermetabolic lymph nodes in the abdomen or pelvis. Incidental CT findings: Uterus and adnexa unremarkable. SKELETON: No focal hypermetabolic activity to suggest skeletal metastasis. Incidental CT findings: none IMPRESSION: 1. Interval decrease in size and metabolic activity of prevascular lymph nodes/pleural metastasis. 2. Interval decrease in metabolic activity of RIGHT upper lobe pulmonary nodule. 3. Increased metabolic activity at the site of previous described RIGHT level I lymph node. No lesion identified noncontrast CT correlation. Recommend contrast CT of the neck for further evaluation. Electronically Signed   By: Suzy Bouchard M.D.   On: 05/22/2020 09:48     ASSESSMENT:  1. 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 Nicolette Bang -Pathology showed 4.2 cm tumor with margins negative, 17 mm depth of invasion, moderately to poorly differentiated, positive neural invasion, 2/17 lymph nodes positive, ECE positive, PT 4 APN 3B. -IMRT with weekly cisplatin from 06/27/2019 through 07/25/2019. -PET scan on 11/04/2019 showed hypermetabolic level 1B lymph node in the right submental region measuring 5 mm.  Left upper lobe pulmonary nodule measuring 8 mm. -PET scan on 02/13/2020 with ill-defined 9 mm short axis level 1B node with SUV 8.6, previously 5.6 SUV.  2.1 cm short axis superior mediastinal/prevascular node previously 8 mm SUV 13.2. -Bronchoscopy on 03/06/2020 with biopsy of the left lung lesion consistent with squamous cell carcinoma.  Right lung lesion shows atypical cells. -XRT to the right and left lung lesions completed on 04/02/2020.   PLAN:  1. 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  It showed interval decrease in size and metabolic activity of the prevascular lymph  node/pleural meta stasis.  Also interval decrease in metabolic activity of the right upper lobe pulmonary nodule. -There is increased metabolic activity at the site of without CT correlate. -Patient reports that she has developed pain in the right submental region since July.  Reports has dull aching pain lasting few minutes daily. -I have recommended ordering a CT scan of the soft tissues of the neck for further evaluation. -We will also make a referral to Dr. Nicolette Bang.  I will talk to her on the phone after CT scan.  2. Nutrition: -She is drinking about 5 to 6 cans of boost/Ensure per day.  She has lost 4 pounds since last visit.  3. Oral thrush: -She has recurrent thrush on the tongue and has tried Diflucan in the past multiple times. -We will start her back on Diflucan daily for a month.   Orders placed this encounter:  No orders of the defined types were placed in this encounter.    Derek Jack, MD [place] [phone]   I, Milinda Antis, am acting as a scribe for Dr. Sanda Linger.  I, Derek Jack MD, have reviewed the above documentation for accuracy and completeness, and I agree with the above.

## 2020-05-25 ENCOUNTER — Other Ambulatory Visit: Payer: Self-pay

## 2020-05-25 ENCOUNTER — Ambulatory Visit
Admission: RE | Admit: 2020-05-25 | Discharge: 2020-05-25 | Disposition: A | Discharge status: Home / Self Care | Payer: BC Managed Care – PPO | Source: Ambulatory Visit | Attending: Radiation Oncology | Admitting: Radiation Oncology

## 2020-05-30 ENCOUNTER — Telehealth: Payer: Self-pay | Admitting: Radiation Oncology

## 2020-05-30 NOTE — Telephone Encounter (Signed)
Rec'd on my desk the forms from [facility]. I have faxed them to [phone] and on the coversheet explained that there is no medical record note from 7/30 from Dr. Isidore Moos.

## 2020-06-01 ENCOUNTER — Ambulatory Visit
Admission: RE | Admit: 2020-06-01 | Discharge: 2020-06-01 | Disposition: A | Discharge status: Home / Self Care | Payer: BC Managed Care – PPO | Source: Ambulatory Visit | Attending: Radiation Oncology | Admitting: Radiation Oncology

## 2020-06-01 DIAGNOSIS — C021 Malignant neoplasm of border of tongue: Secondary | ICD-10-CM

## 2020-06-01 DIAGNOSIS — C3412 Malignant neoplasm of upper lobe, left bronchus or lung: Secondary | ICD-10-CM

## 2020-06-01 DIAGNOSIS — C3411 Malignant neoplasm of upper lobe, right bronchus or lung: Secondary | ICD-10-CM

## 2020-06-05 ENCOUNTER — Encounter: Payer: Self-pay | Admitting: Radiation Oncology

## 2020-06-05 ENCOUNTER — Other Ambulatory Visit: Payer: Self-pay

## 2020-06-05 DIAGNOSIS — Z1329 Encounter for screening for other suspected endocrine disorder: Secondary | ICD-10-CM

## 2020-06-05 NOTE — Progress Notes (Signed)
Radiation Oncology         [phone] ________________________________  Name: Terri [name] MRN: [id number]  Date: 06/01/2020  DOB: 1951-04-13  Follow-Up Visit Note by telephone as patient opted for telemedicine was unable to access MyChart video during pandemic precautions   CC: [name], DO  Minette Brine, Amber Lea, DO  Diagnosis and Prior Radiotherapy:       ICD-10-CM   1. Malignant neoplasm of upper lobe, right bronchus or lung ([place])  C34.11   2. Malignant neoplasm of upper lobe, left bronchus or lung ([facility])  C34.12   3. Malignant neoplasm of tip and lateral border of tongue ([facility])  C02.1     06/27/2019 through 08/05/2019  Site Technique Total Dose (Gy) Dose per Fx (Gy) Completed Fx Beam Energies  Head & neck: HN_tongue IMRT 60/60 2 30/30 6X    Radiation Treatment Dates: 03/21/2020 through 04/03/2020 Site Technique Total Dose (Gy) Dose per Fx (Gy) Completed Fx Beam Energies  Lung, Right: Lung_Rt IMRT 50/50 10 5/5 6XFFF  Lung, Left: Lung_Lt IMRT 50/50 5 10/10 6XFFF    CHIEF COMPLAINT:   tongue cancer  Narrative:    I spoke with the patient today by phone as she opted for telemedicine visit.  She is doing well overall.  Interestingly she states that she had a lot of pain in the right submandibular area during her PET scan but now the pain is almost gone.  The most recent PET scan, and I have reviewed the images personally, shows excellent response to targeted radiotherapy in the chest; there is nonspecific hypermetabolic activity in the right submandibular region where we have been following a lymph node, however no clear mass on the CT images.  She is going to have a CT of her neck with contrast to inspect this area further.  She has received her 2 Covid vaccines to date.  ALLERGIES:  has No Known Allergies.  Meds: Current Outpatient Medications  Medication Sig Dispense Refill  . acetaminophen (TYLENOL) 650 MG CR tablet Take 1,300 mg by mouth every 8 (eight) hours as  needed for pain.    . DENTA 5000 PLUS 1.1 % CREA dental cream Place 1 application onto teeth at bedtime.     . fluconazole (DIFLUCAN) 100 MG tablet Take 1-2 tablets (100-200 mg total) by mouth See admin instructions. Take 200 mg on day 1 then take 100 mg everyday for 13 days    . fluconazole (DIFLUCAN) 100 MG tablet Take 1 tablet (100 mg total) by mouth daily. 30 tablet 1  . lidocaine-prilocaine (EMLA) cream Apply 1 application topically daily as needed (port access). (Patient not taking: Reported on 05/23/2020)    . OVER THE COUNTER MEDICATION Take 1 tablet by mouth daily. VS-C supplement    . Probiotic CAPS Take 1 capsule by mouth daily.     No current facility-administered medications for this encounter.   Facility-Administered Medications Ordered in Other Encounters  Medication Dose Route Frequency Provider Last Rate Last Admin  . heparin lock flush 100 unit/mL  500 Units Intracatheter Once PRN Derek Jack, MD      . sodium chloride 0.9 % 1,000 mL with potassium chloride 20 mEq, magnesium sulfate 2 g infusion   Intravenous Once Derek Jack, MD      . sodium chloride flush (NS) 0.9 % injection 10 mL  10 mL Intracatheter Once PRN Derek Jack, MD        Physical Findings: The patient is in no acute distress. Patient is  alert and oriented. Wt Readings from Last 3 Encounters:  05/23/20 138 lb 8 oz (62.8 kg)  03/14/20 142 lb (64.4 kg)  03/06/20 143 lb (64.9 kg)    vitals were not taken for this visit. .  General: Alert and oriented, in no acute distress  Psychiatric: Judgment and insight are intact. Affect is appropriate.   Lab Findings: Lab Results  Component Value Date   WBC 2.9 (L) 05/21/2020   HGB 13.1 05/21/2020   HCT 42.2 05/21/2020   MCV 92.5 05/21/2020   PLT 161 05/21/2020    Lab Results  Component Value Date   TSH 3.242 11/07/2019    Radiographic Findings: NM PET Image Restag (PS) Skull Base To Thigh  Result Date: 05/22/2020 CLINICAL  DATA:  Subsequent treatment strategy for head neck cancer. Lingual carcinoma. EXAM: NUCLEAR MEDICINE PET SKULL BASE TO THIGH TECHNIQUE: 8.5 mCi F-18 FDG was injected intravenously. Full-ring PET imaging was performed from the skull base to thigh after the radiotracer. CT data was obtained and used for attenuation correction and anatomic localization. Fasting blood glucose: 89 mg/dl COMPARISON:  PET-CT 02/13/2020 FINDINGS: Mediastinal blood pool activity: SUV max 2.42 Liver activity: SUV max NA NECK: Increased metabolic activity in the RIGHT submental region at the prior site of the RIGHT level 1 lymph node described on comparison PET-CT scan. Activity is increased from SUV max 7.32 to SUV max 11.5. No clear lesion is identified on the CT portion exam. There is increased activity in the LEFT maxillary bone compared to prior. This is a broad region of intense activity extending midline posteriorly or 5 cm. Favor odontogenic in origin No additional lymph nodes in the neck identified. Incidental CT findings: none CHEST: Marked interval decrease in activity of the anterior mediastinal node/pleural lesion in the prevascular location with SUV max equal 5.7 decreased from 13.8. Soft tissue component measuring 2.1 cm (image 119/3) compares to 2.7 cm No additional hypermetabolic mediastinal lymph nodes. Peripheral nodule in the RIGHT upper lobe measuring 7 mm (image 118/3) similar to 8 mm on prior; however, nodule decreased in metabolic activity with SUV max equal 1.8 decreased from SUV max equal 3.2. No new pulmonary nodules Incidental CT findings: none ABDOMEN/PELVIS: No abnormal hypermetabolic activity within the liver, pancreas, adrenal glands, or spleen. No hypermetabolic lymph nodes in the abdomen or pelvis. Incidental CT findings: Uterus and adnexa unremarkable. SKELETON: No focal hypermetabolic activity to suggest skeletal metastasis. Incidental CT findings: none IMPRESSION: 1. Interval decrease in size and metabolic  activity of prevascular lymph nodes/pleural metastasis. 2. Interval decrease in metabolic activity of RIGHT upper lobe pulmonary nodule. 3. Increased metabolic activity at the site of previous described RIGHT level I lymph node. No lesion identified noncontrast CT correlation. Recommend contrast CT of the neck for further evaluation. Electronically Signed   By: Suzy Bouchard M.D.   On: 05/22/2020 09:48   Prior RT plan images depicting right IB node in yellow 100% isodose line (60Gy):   Impression/Plan:    Head and Neck Cancer Status: Excellent response to targeted radiation therapy in the chest.  We will continue to follow the right submandibular region.  CT with contrast pending.  She has follow-up with medical oncology and otolaryngology in early September after that scan  I will see her back in January 2022 with annual TSH labwork at that time.  We discussed measures to reduce the risk of infection during the COVID-19 pandemic.  She has received 2 mRNA vaccines.  We talked about the potential benefits  of the booster shot.  I encouraged her to get this.  This encounter was provided by telemedicine platform by telephone as patient was unable to access MyChart video during pandemic precautions The patient has given verbal consent for this type of encounter and has been advised to only accept a meeting of this type in a secure network environment. The time spent during this encounter on date of service, in total, was 20 minutes. The attendants for this meeting include Eppie Gibson  and Gearldine Shown.  During the encounter, Eppie Gibson was located at [facility] Radiation Oncology Department.  Terri [name] was located at home.   _____________________________________   Eppie Gibson, MD

## 2020-06-08 ENCOUNTER — Other Ambulatory Visit: Payer: Self-pay

## 2020-06-08 ENCOUNTER — Ambulatory Visit (HOSPITAL_COMMUNITY)
Admission: RE | Admit: 2020-06-08 | Discharge: 2020-06-08 | Disposition: A | Discharge status: Home / Self Care | Payer: BC Managed Care – PPO | Source: Ambulatory Visit | Attending: Hematology | Admitting: Hematology

## 2020-06-08 DIAGNOSIS — C023 Malignant neoplasm of anterior two-thirds of tongue, part unspecified: Secondary | ICD-10-CM | POA: Diagnosis present

## 2020-06-08 MED ORDER — IOHEXOL 300 MG/ML  SOLN
75.0000 mL | Freq: Once | INTRAMUSCULAR | Status: AC | PRN
  Administered 2020-06-08: 75 mL via INTRAVENOUS

## 2020-06-12 ENCOUNTER — Inpatient Hospital Stay (HOSPITAL_COMMUNITY): Payer: BC Managed Care – PPO | Attending: Hematology | Admitting: Hematology

## 2020-06-12 ENCOUNTER — Other Ambulatory Visit: Payer: Self-pay

## 2020-06-12 DIAGNOSIS — C023 Malignant neoplasm of anterior two-thirds of tongue, part unspecified: Secondary | ICD-10-CM

## 2020-06-12 MED ORDER — OXYCODONE HCL 5 MG PO CAPS: 5.0000 mg | ORAL_CAPSULE | Freq: Every evening | ORAL | 0 refills | Status: DC | PRN

## 2020-06-12 NOTE — Progress Notes (Signed)
Virtual Visit via Telephone Note  I connected with Terri [name] on 06/12/20 at  4:15 PM EDT by telephone and verified that I am speaking with the correct person using two identifiers.   I discussed the limitations, risks, security and privacy concerns of performing an evaluation and management service by telephone and the availability of in person appointments. I also discussed with the patient that there may be a patient responsible charge related to this service. The patient expressed understanding and agreed to proceed.   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 Nicolette Bang at [facility].  She underwent IMRT with weekly cisplatin from 06/27/2019 through 07/25/2019.  She recently finished XRT to the right and left lung lesions completed on 04/02/2020 with Dr. Isidore Moos.   Observations/Objective: She reported having right shin pain, slightly worse than last visit.  Reports pain mostly at nighttime lasting about 15 minutes sometimes wakes up in the middle of the night.  She is taking Tylenol for it which is not helping.  Nutritional status is stable with boost to 5-7 bottles per day.  Chronic constipation is also stable.  Assessment and Plan:  1.  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  Also interval decrease in metabolic activity of the right upper lobe pulmonary nodule.  However increased metabolic activity at the site of the right submental region. -We reviewed results of the CT soft tissue neck on 06/08/2020 which showed increase in size of the right submandibular lymph node with central necrosis.  No new abnormal lymph nodes seen. -She was told to follow-up with Dr.  Nicolette Bang at [facility] tomorrow. -She was told to take a copy of the CT scan with her. -I will see her back in 3 weeks for follow-up.  2.  Right submental pain: -She is taking Tylenol which is not helping. -I will start her on oxycodone 5 mg at bedtime.   Follow Up Instructions: RTC 3 weeks for follow-up.   I discussed the assessment and treatment plan with the patient. The patient was provided an opportunity to ask questions and all were answered. The patient agreed with the plan and demonstrated an understanding of the instructions.   The patient was advised to call back or seek an in-person evaluation if the symptoms worsen or if the condition fails to improve as anticipated.  I provided 12 minutes of non-face-to-face time during this encounter.   Derek Jack, MD

## 2020-07-10 ENCOUNTER — Other Ambulatory Visit: Payer: Self-pay

## 2020-07-10 ENCOUNTER — Inpatient Hospital Stay (HOSPITAL_COMMUNITY): Payer: BC Managed Care – PPO | Attending: Hematology | Admitting: Hematology

## 2020-07-10 DIAGNOSIS — C021 Malignant neoplasm of border of tongue: Secondary | ICD-10-CM

## 2020-07-10 MED ORDER — FLUCONAZOLE 100 MG PO TABS: 100.0000 mg | ORAL_TABLET | Freq: Every day | ORAL | 1 refills | Status: DC

## 2020-07-10 NOTE — Progress Notes (Signed)
Virtual Visit via Telephone Note  I connected with Terri [name] on 07/10/20 at  2:45 PM EDT by telephone and verified that I am speaking with the correct person using two identifiers.   I discussed the limitations, risks, security and privacy concerns of performing an evaluation and management service by telephone and the availability of in person appointments. I also discussed with the patient that there may be a patient responsible charge related to this service. The patient expressed understanding and agreed to proceed.   History of Present Illness: She had a right partial glossectomy and neck dissection on 05/13/2019. -IMRT with weekly cisplatin from 06/27/2019 through 07/25/2019. -XRT to the right and left lung lesions completed on 04/02/2020.   Observations/Objective: She reports that she does not have any pain in the submandibular region.  She is not requiring any pain medication on a regular basis.  Appetite is 50%.  Energy levels are 70%.  She has minor sleep problems but also have improved.  She is taking Diflucan and reports that when she stops Diflucan thrush comes back.  She is drinking boost/Ensure daily.  Assessment and Plan:  1.  Stage IVb SCC of the right lateral tongue: -She met with Dr. Nicolette Bang at [facility] who offered surgical resection. -Patient was reluctant to consider surgical resection at this time. -She reports that her pain completely resolved in the last few weeks. -She rarely has to take Tylenol for pain.  She is not requiring oxycodone. -I discussed further treatment options including immunotherapy in this setting. -We have also discussed following her with another PET scan in 6 to 8 weeks.  If there is any progression, will consider systemic therapy with immunotherapy agents.  2.  Recurrent thrush: -She has recurrent thrush which relapses back after she stops taking Diflucan for a week. -For now she will continue Diflucan as it clears up as long as  she is taking it.  3.  Nutrition: -She denies any weight loss.  Drinking about 5 to 6 cans of boost/Ensure per day.   Follow Up Instructions: Follow-up in 6 to 8 weeks after PET scan.   I discussed the assessment and treatment plan with the patient. The patient was provided an opportunity to ask questions and all were answered. The patient agreed with the plan and demonstrated an understanding of the instructions.   The patient was advised to call back or seek an in-person evaluation if the symptoms worsen or if the condition fails to improve as anticipated.  I provided 15 minutes of non-face-to-face time during this encounter.   Derek Jack, MD

## 2020-07-10 NOTE — Addendum Note (Signed)
Addended by: Donetta Potts on: 07/10/2020 03:57 PM   Modules accepted: Orders

## 2020-08-03 ENCOUNTER — Telehealth (HOSPITAL_COMMUNITY): Payer: Self-pay

## 2020-08-03 NOTE — Telephone Encounter (Signed)
Patient called wanting to let us know that she has a boil on her neck and that it has been draining clear fluid. She has been doing warm compresses. She is wanting to know what she should do.   Spoke with Rulon Abide, NP and she said to instruct patient to keep the area clean and dry and to continue doing warm compresses. If it becomes painful or has redness to it then she needs to be seen on Monday. Patient informed and is agreeable with instructions.

## 2020-08-13 ENCOUNTER — Other Ambulatory Visit (HOSPITAL_COMMUNITY): Payer: Self-pay

## 2020-08-13 MED ORDER — OXYCODONE HCL 5 MG PO CAPS: 5.0000 mg | ORAL_CAPSULE | Freq: Every evening | ORAL | 0 refills | Status: DC | PRN

## 2020-08-27 ENCOUNTER — Encounter (HOSPITAL_COMMUNITY)
Admission: RE | Admit: 2020-08-27 | Discharge: 2020-08-27 | Disposition: A | Discharge status: Home / Self Care | Payer: BC Managed Care – PPO | Source: Ambulatory Visit | Attending: Hematology | Admitting: Hematology

## 2020-08-27 ENCOUNTER — Other Ambulatory Visit: Payer: Self-pay

## 2020-08-27 DIAGNOSIS — C021 Malignant neoplasm of border of tongue: Secondary | ICD-10-CM | POA: Diagnosis not present

## 2020-08-27 MED ORDER — FLUDEOXYGLUCOSE F - 18 (FDG) INJECTION
9.6100 | Freq: Once | INTRAVENOUS | Status: AC | PRN
  Administered 2020-08-27: 9.61 via INTRAVENOUS

## 2020-08-29 ENCOUNTER — Other Ambulatory Visit (HOSPITAL_COMMUNITY): Payer: Self-pay | Admitting: *Deleted

## 2020-08-29 ENCOUNTER — Other Ambulatory Visit: Payer: Self-pay

## 2020-08-29 ENCOUNTER — Inpatient Hospital Stay (HOSPITAL_COMMUNITY): Payer: BC Managed Care – PPO | Attending: Hematology | Admitting: Hematology

## 2020-08-29 VITALS — BP 135/67 | HR 76 | Temp 97.0°F | Resp 17 | Wt 135.8 lb

## 2020-08-29 DIAGNOSIS — Z8249 Family history of ischemic heart disease and other diseases of the circulatory system: Secondary | ICD-10-CM | POA: Insufficient documentation

## 2020-08-29 DIAGNOSIS — B370 Candidal stomatitis: Secondary | ICD-10-CM | POA: Diagnosis not present

## 2020-08-29 DIAGNOSIS — I10 Essential (primary) hypertension: Secondary | ICD-10-CM | POA: Insufficient documentation

## 2020-08-29 DIAGNOSIS — Z9221 Personal history of antineoplastic chemotherapy: Secondary | ICD-10-CM | POA: Diagnosis not present

## 2020-08-29 DIAGNOSIS — C021 Malignant neoplasm of border of tongue: Secondary | ICD-10-CM | POA: Diagnosis present

## 2020-08-29 DIAGNOSIS — Z79899 Other long term (current) drug therapy: Secondary | ICD-10-CM | POA: Diagnosis not present

## 2020-08-29 DIAGNOSIS — Z7189 Other specified counseling: Secondary | ICD-10-CM | POA: Diagnosis not present

## 2020-08-29 DIAGNOSIS — Z923 Personal history of irradiation: Secondary | ICD-10-CM | POA: Diagnosis not present

## 2020-08-29 MED ORDER — OXYCODONE HCL 5 MG PO CAPS: 5.0000 mg | ORAL_CAPSULE | Freq: Four times a day (QID) | ORAL | 0 refills | Status: DC | PRN

## 2020-08-29 NOTE — Progress Notes (Signed)
DISCONTINUE ON PATHWAY REGIMEN - Head and Neck     A cycle is every 7 days:     Cisplatin   **Always confirm dose/schedule in your pharmacy ordering system**  REASON: Disease Progression PRIOR TREATMENT: [id number]: Cisplatin 40 mg/m2 q7 Days with Concurrent Radiation TREATMENT RESPONSE: Progressive Disease (PD)    Patient Characteristics: Oral Cavity, Metastatic, First Line, Prior Platinum-Based Chemoradiation within 6 Months, Candidate for Checkpoint Inhibitor Disease Classification: Oral Cavity AJCC T Category: T4a AJCC M Category: M1 AJCC N Category: cN3b AJCC 8 Stage Grouping: IVC Therapeutic Status: Metastatic Disease Line of Therapy: First Line Prior Platinum Status: Prior Platinum-Based Chemoradiation within 6 Months Checkpoint Inhibitor Candidacy: Candidate for Checkpoint Inhibitor

## 2020-08-29 NOTE — Progress Notes (Signed)
[address] [place], [place] [address]   CLINIC:  Medical Oncology/Hematology  PCP:  [name], DO [place] / [name] [address] [phone]   REASON FOR VISIT:  Follow-up for tongue cancer  PRIOR THERAPY:  1. Right partial glossectomy & neck dissection on 05/13/2019. 2. Aloxi and cisplatin from 06/27/2019 through 07/25/2019. 3. Radiation therapy to left lung lesion from 03/21/2020 to 04/03/2020.  NGS Results: Not done  CURRENT THERAPY: Observation  BRIEF ONCOLOGIC HISTORY:  Oncology History  Cancer of lateral margin of anterior two-thirds of tongue ([place])  04/11/2019 Initial Diagnosis   Cancer of lateral margin of anterior two-thirds of tongue ([place])   05/30/2019 Cancer Staging   Staging form: Oral Cavity, AJCC 8th Edition - Clinical: Stage IVB (cT4a, cN3b, cM0) - Signed by Derek Jack, MD on 05/30/2019   06/07/2019 Cancer Staging   Staging form: Oral Cavity, AJCC 8th Edition - Pathologic stage from 06/07/2019: Stage IVB (pT4a, pN3b, cM0) - Signed by Eppie Gibson, MD on 06/07/2019   06/27/2019 -  Chemotherapy   The patient had palonosetron (ALOXI) injection 0.25 mg, 0.25 mg, Intravenous,  Once, 6 of 7 cycles Administration: 0.25 mg (06/27/2019), 0.25 mg (07/04/2019), 0.25 mg (07/11/2019), 0.25 mg (07/18/2019), 0.25 mg (07/25/2019) CISplatin (PLATINOL) 79 mg in sodium chloride 0.9 % 250 mL chemo infusion, 40 mg/m2 = 79 mg, Intravenous,  Once, 6 of 7 cycles Dose modification: 32 mg/m2 (80 % of original dose 40 mg/m2, Cycle 4, Reason: Other (see comments), Comment: weight loss, low wbc), 32 mg/m2 (original dose 40 mg/m2, Cycle 5, Reason: Treatment Parameters Not Met, Comment: ANC 1.4) Administration: 79 mg (06/27/2019), 79 mg (07/04/2019), 79 mg (07/11/2019), 63 mg (07/18/2019), 63 mg (07/25/2019) fosaprepitant (EMEND) 150 mg, dexamethasone (DECADRON) 12 mg in sodium chloride 0.9 % 145 mL IVPB, , Intravenous,  Once, 6 of 7  cycles Administration:  (06/27/2019),  (07/04/2019),  (07/11/2019),  (07/18/2019),  (07/25/2019)  for chemotherapy treatment.      CANCER STAGING: Cancer Staging Cancer of lateral margin of anterior two-thirds of tongue ([place]) Staging form: Oral Cavity, AJCC 8th Edition - Clinical: Stage IVB (cT4a, cN3b, cM0) - Signed by Derek Jack, MD on 05/30/2019 - Pathologic stage from 06/07/2019: Stage IVB (pT4a, pN3b, cM0) - Signed by Eppie Gibson, MD on 06/07/2019   INTERVAL HISTORY:  [name], a 69 y.o. female, returns for routine follow-up of her tongue cancer. [place] was last contacted via telephone on 07/10/2020.   Today she is accompanied by her son and she report feeling poorly. She reports serous draining from the right submandibular region for the past month necessitating 2 bandage changes per day while the soreness and pain in the superior right ramus of the jaw and in the preauricular region have worsened; the pain mainly comes in the evening and is usually not tender to touch. She takes 1 tablet of oxycodone with an occasional Tylenol at bedtime lasting about 4 hours and takes Tylenol during the day if needed; she reports feeling drowsy when she takes the oxycodone. She is drinking about 7 cans of Boost daily and eating small portions of soft foods once a day. She complains of having 1 fainting episode at home at night while talking on the phone, but quickly regained consciousness; the fainting episodes have become more frequent recently and even passes out during yawning, but denies that the fainting is caused by the pain.  She saw Dr. Nicolette Bang last on 06/13/2020.  REVIEW OF SYSTEMS:  Review of Systems  Constitutional: Positive for appetite change (25%) and fatigue (25%).  Musculoskeletal: Positive for neck pain (8-10/10 jaw pain).  Neurological: Positive for dizziness (recurring fainting episodes).  Psychiatric/Behavioral: Positive for sleep disturbance (d/t jaw pain).  All  other systems reviewed and are negative.   PAST MEDICAL/SURGICAL HISTORY:  Past Medical History:  Diagnosis Date  . Arthritis   . Cancer (HCC)    tongue cancer  . GERD (gastroesophageal reflux disease)   . History of radiation therapy 06/27/19- 08/05/19   HN tongue, 30 fractions of 2 Gy each to total 60 Gy.   [name] Hypertension   . LBBB (left bundle branch block)   . Port-A-Cath in place 06/14/2019  . Wears glasses    or contact lenses   Past Surgical History:  Procedure Laterality Date  . BRONCHIAL BIOPSY  03/06/2020   Procedure: BRONCHIAL BIOPSIES;  Surgeon: Collene Gobble, MD;  Location: [facility] ENDOSCOPY;  Service: Pulmonary;;  . BRONCHIAL BRUSHINGS  03/06/2020   Procedure: BRONCHIAL BRUSHINGS;  Surgeon: Collene Gobble, MD;  Location: [facility] ENDOSCOPY;  Service: Pulmonary;;  . BRONCHIAL NEEDLE ASPIRATION BIOPSY  03/06/2020   Procedure: BRONCHIAL NEEDLE ASPIRATION BIOPSIES;  Surgeon: Collene Gobble, MD;  Location: [facility];  Service: Pulmonary;;  . BRONCHIAL WASHINGS  03/06/2020   Procedure: BRONCHIAL WASHINGS;  Surgeon: Collene Gobble, MD;  Location: [facility] ENDOSCOPY;  Service: Pulmonary;;  . COLONOSCOPY W/ BIOPSIES AND POLYPECTOMY    . 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 Dr. Nicolette Bang at [facility]  . PORTACATH PLACEMENT Left 06/10/2019   Procedure: INSERTION PORT-A-CATH;  Surgeon: Aviva Signs, MD;  Location: [facility] ORS;  Service: General;  Laterality: Left;  pt knows to arrive at 6:15  . TUBAL LIGATION    . VIDEO BRONCHOSCOPY WITH ENDOBRONCHIAL NAVIGATION N/A 03/06/2020   Procedure: VIDEO BRONCHOSCOPY WITH ENDOBRONCHIAL NAVIGATION;  Surgeon: Collene Gobble, MD;  Location: [facility] ENDOSCOPY;  Service: Pulmonary;  Laterality: N/A;    SOCIAL HISTORY:  Social History   Socioeconomic History  . Marital status: Widowed    Spouse name: Not on file  . Number of children: 2  . Years of education: Not on file  . Highest  education level: Not on file  Occupational History  . Not on file  Tobacco Use  . Smoking status: Never Smoker  . Smokeless tobacco: Never Used  Vaping Use  . Vaping Use: Never used  Substance and Sexual Activity  . Alcohol use: Never  . Drug use: Never  . Sexual activity: Not Currently  Other Topics Concern  . Not on file  Social History Narrative      Patient is a widow.   Patient lives with her 2 sons in [place], [place].   Patient is a non-smoker, nondrinker.  Patient has never used chew.  Patient does not use illicit drugs.   Social Determinants of Health   Financial Resource Strain:   . Difficulty of Paying Living Expenses: Not on file  Food Insecurity:   . Worried About Charity fundraiser in the Last Year: Not on file  . Ran Out of Food in the Last Year: Not on file  Transportation Needs:   . Lack of Transportation (Medical): Not on file  . Lack of Transportation (Non-Medical): Not on file  Physical Activity:   . Days of Exercise per Week: Not on file  . Minutes of Exercise  per Session: Not on file  Stress:   . Feeling of Stress : Not on file  Social Connections:   . Frequency of Communication with Friends and Family: Not on file  . Frequency of Social Gatherings with Friends and Family: Not on file  . Attends Religious Services: Not on file  . Active Member of Clubs or Organizations: Not on file  . Attends Archivist Meetings: Not on file  . Marital Status: Not on file  Intimate Partner Violence:   . Fear of Current or Ex-Partner: Not on file  . Emotionally Abused: Not on file  . Physically Abused: Not on file  . Sexually Abused: Not on file    FAMILY HISTORY:  Family History  Problem Relation Age of Onset  . Heart failure Mother   . Heart failure Father     CURRENT MEDICATIONS:  Current Outpatient Medications  Medication Sig Dispense Refill  . acetaminophen (TYLENOL) 650 MG CR tablet Take 1,300 mg by mouth every 8 (eight) hours as  needed for pain.    . DENTA 5000 PLUS 1.1 % CREA dental cream Place 1 application onto teeth at bedtime.     . fluconazole (DIFLUCAN) 100 MG tablet Take 1 tablet (100 mg total) by mouth daily. 30 tablet 1  . lidocaine-prilocaine (EMLA) cream Apply 1 application topically daily as needed (port access).    [name] OVER THE COUNTER MEDICATION Take 1 tablet by mouth daily. VS-C supplement    . oxycodone (OXY-IR) 5 MG capsule Take 1 capsule (5 mg total) by mouth at bedtime as needed. 30 capsule 0  . Probiotic CAPS Take 1 capsule by mouth daily.     No current facility-administered medications for this visit.   Facility-Administered Medications Ordered in Other Visits  Medication Dose Route Frequency Provider Last Rate Last Admin  . heparin lock flush 100 unit/mL  500 Units Intracatheter Once PRN Derek Jack, MD      . sodium chloride 0.9 % 1,000 mL with potassium chloride 20 mEq, magnesium sulfate 2 g infusion   Intravenous Once Derek Jack, MD      . sodium chloride flush (NS) 0.9 % injection 10 mL  10 mL Intracatheter Once PRN Derek Jack, MD        ALLERGIES:  No Known Allergies  PHYSICAL EXAM:  Performance status (ECOG): 0 - Asymptomatic  Vitals:   08/29/20 1415  BP: 135/67  Pulse: 76  Resp: 17  Temp: (!) 97 F (36.1 C)  SpO2: 100%   Wt Readings from Last 3 Encounters:  08/29/20 135 lb 12.8 oz (61.6 kg)  05/23/20 138 lb 8 oz (62.8 kg)  03/14/20 142 lb (64.4 kg)   Physical Exam Vitals reviewed.  Constitutional:      Appearance: Normal appearance.  HENT:     Head:     Jaw: No tenderness.  Cardiovascular:     Rate and Rhythm: Normal rate and regular rhythm.     Pulses: Normal pulses.     Heart sounds: Normal heart sounds.  Pulmonary:     Effort: Pulmonary effort is normal.     Breath sounds: Normal breath sounds.  Abdominal:     Palpations: Abdomen is soft.  Neurological:     General: No focal deficit present.     Mental Status: She is alert and  oriented to person, place, and time.  Psychiatric:        Mood and Affect: Mood normal.        Behavior:  Behavior normal.        LABORATORY DATA:  I have reviewed the labs as listed.  CBC Latest Ref Rng & Units 05/21/2020 03/02/2020 11/07/2019  WBC 4.0 - 10.5 K/uL 2.9(L) 2.6(L) 2.6(L)  Hemoglobin 12.0 - 15.0 g/dL 13.1 14.2 11.4(L)  Hematocrit 36 - 46 % 42.2 41.5 35.3(L)  Platelets 150 - 400 K/uL 161 182.0 147(L)   CMP Latest Ref Rng & Units 05/21/2020 03/02/2020 11/07/2019  Glucose 70 - 99 mg/dL 92 87 99  BUN 8 - 23 mg/dL 34(H) 36(H) 16  Creatinine 0.44 - 1.00 mg/dL 0.83 1.00 0.95  Sodium 135 - 145 mmol/L 138 133(L) 135  Potassium 3.5 - 5.1 mmol/L 4.6 4.6 3.9  Chloride 98 - 111 mmol/L 102 98 102  CO2 22 - 32 mmol/L '28 28 25  ' Calcium 8.9 - 10.3 mg/dL 9.6 10.3 9.2  Total Protein 6.5 - 8.1 g/dL 7.6 8.1 6.8  Total Bilirubin 0.3 - 1.2 mg/dL 0.4 0.3 0.6  Alkaline Phos 38 - 126 U/L 59 69 48  AST 15 - 41 U/L '23 23 20  ' ALT 0 - 44 U/L '21 19 15    ' DIAGNOSTIC IMAGING:  I have independently reviewed the scans and discussed with the patient. NM PET Image Restag (PS) Skull Base To Thigh  Result Date: 08/28/2020 CLINICAL DATA:  Subsequent treatment strategy for head neck cancer, status post tongue resection, chemotherapy, and radiation. EXAM: NUCLEAR MEDICINE PET SKULL BASE TO THIGH TECHNIQUE: 9.6 mCi F-18 FDG was injected intravenously. Full-ring PET imaging was performed from the skull base to thigh after the radiotracer. CT data was obtained and used for attenuation correction and anatomic localization. Fasting blood glucose: 70 mg/dl COMPARISON:  CT neck dated 06/08/2020.  PET-CT dated 05/21/2020. FINDINGS: Mediastinal blood pool activity: SUV max 3.1 Liver activity: SUV max NA NECK: 19 mm short axis right submandibular node (series 3/image 93), max SUV 12.0, previously 11.5. Asymmetric hypermetabolism involving the left anterior tongue in this patient status post right partial glossectomy, max SUV  14.1. This is favored to be physiologic/reactive rather than indicative of underlying tumor. Incidental CT findings: none CHEST: 7 mm short axis anterior mediastinal/prevascular node (series 3/image 118), max SUV 4.1. Otherwise, no suspicious thoracic lymphadenopathy. No hypermetabolic pulmonary nodules. Mild hypermetabolism along the right lateral chest wall, max SUV 4.2, likely physiologic/reactive. Incidental CT findings: Left chest port terminates at the cavoatrial junction. Mild atherosclerotic calcifications of the aortic arch. Mild eventration of left hemidiaphragm. ABDOMEN/PELVIS: No abnormal hypermetabolism involving the liver, spleen, pancreas, or adrenal glands. No hypermetabolic abdominopelvic lymphadenopathy. Incidental CT findings: Mild atherosclerotic calcifications of the aortic arch. SKELETON: No focal hypermetabolic activity to suggest skeletal metastasis. Incidental CT findings: Mild degenerative changes of the visualized thoracolumbar spine. IMPRESSION: Hypermetabolic right submandibular nodal metastasis, as above. Suspected new left anterior mediastinal/prevascular node. Attention on follow-up is suggested. Asymmetric hypermetabolism involving the left anterior tongue is favored to be physiologic/reactive in this patient status post right partial glossectomy. Electronically Signed   By: Julian Hy M.D.   On: 08/28/2020 11:34     ASSESSMENT:  1. 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 Nicolette Bang -Pathology showed 4.2 cm tumor with margins negative, 17 mm depth of invasion, moderately to poorly differentiated, positive neural invasion, 2/17 lymph nodes positive, ECE positive, PT 4 APN 3B. -IMRT with weekly cisplatin from 06/27/2019 through 07/25/2019. -PET scan on 11/04/2019 showed hypermetabolic level 1B lymph  node in the right submental region measuring 5 mm. Left  upper lobe pulmonary nodule measuring 8 mm. -PET scan on 02/13/2020 with ill-defined 9 mm short axis level 1B node with SUV 8.6, previously 5.6 SUV. 2.1 cm short axis superior mediastinal/prevascular node previously 8 mm SUV 13.2. -Bronchoscopy on 03/06/2020 with biopsy of the left lung lesion consistent with squamous cell carcinoma. Right lung lesion shows atypical cells. -XRT to the right and left lung lesions completed on 04/02/2020 -She met with Dr. Lorrene Reid at [facility] who offered surgical resection.  Patient was reluctant to consider it given morbidity associated with it. -PET scan on 08/27/2020 showed 19 mm short axis right submandibular node, maximum SUV 12.0, previously 11.5.  PET scan on 02/13/2020 showed SUV of 7.32, with lymph node measuring 9 mm in short axis.  Mild hypermetabolism along the right lateral chest wall, SUV 4.2.  7 mm short axis anterior mediastinal/prevascular lymph node, maximum SUV 4.1.    PLAN:  1. 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 -She also developed a drainage area in the right submandibular region.  She is having to change bandages twice daily. -Based on the PET/CT findings, I have recommended CT scan of the neck with contrast. -Clinically the worsening pain and increasing in size of the lymph node and SUV is consistent with recurrence.  She previously had radiation to the metastatic site in the anterior mediastinum and right lung. -The lymph node in the neck started appearing within 6 months after the stopping platinum based chemoradiation therapy. -She is not interested in surgical option.  I have recommended immunotherapy with pembrolizumab.  We discussed side effects in detail.  She is agreeable to this option. -We will likely start her on pembrolizumab every 3 weeks in 2 weeks. -Plan to repeat scans in 3 months.  2.  Nutrition: -She is drinking about 7 cans of boost per day. -She is eating 1 small meal of solid foods. -I have recommended adding some salt to her diet as she complained of dizziness episodes.  She reportedly fainted 1 time.  3. Oral thrush: -She had recurrent thrush since chemoradiation therapy. -She will use Diflucan as needed.  4.  Right submandibular pain: -She has some right submandibular pain which is radiating to the temporomandibular joint. -She is currently taking oxycodone 5 mg at bedtime. -We will increase oxycodone to 5 mg every 6 hours as needed.  We have sent a new refill.   Orders placed this encounter:  Orders Placed This Encounter  Procedures  . CT SOFT TISSUE NECK W CONTRAST     Derek Jack, MD [place] [phone]   I, Milinda Antis, am acting as a scribe for Dr. Sanda Linger.  I, Derek Jack MD, have reviewed the above documentation for accuracy and completeness, and I agree with the above.

## 2020-08-29 NOTE — Progress Notes (Signed)
START ON PATHWAY REGIMEN - Head and Neck     A cycle is every 21 days:     Pembrolizumab   **Always confirm dose/schedule in your pharmacy ordering system**  Patient Characteristics: Oral Cavity, Metastatic, First Line, Prior Platinum-Based Chemoradiation within 6 Months, Candidate for Checkpoint Inhibitor Disease Classification: Oral Cavity AJCC T Category: T4a AJCC M Category: M1 AJCC N Category: cN3b AJCC 8 Stage Grouping: IVC Therapeutic Status: Metastatic Disease Line of Therapy: First Line Prior Platinum Status: Prior Platinum-Based Chemoradiation within 6 Months Checkpoint Inhibitor Candidacy: Candidate for Checkpoint Inhibitor Intent of Therapy: Non-Curative / Palliative Intent, Discussed with Patient

## 2020-08-29 NOTE — Patient Instructions (Signed)
[place] at [facility] Discharge Instructions  You were seen today by Dr. Delton Coombes. He went over your recent results and scans. The next step in treatment involves immunotherapy, called Keytruda, administered every 3 weeks. You will be scheduled for a CT scan of your neck. You will be prescribed oxycodone 5 mg to take every 6-8 hours as needed for the pain. Dr. Delton Coombes will see you back in 2-3 weeks for labs and initiating treatment.   Thank you for choosing [place] at [facility] to provide your oncology and hematology care.  To afford each patient quality time with our provider, please arrive at least 15 minutes before your scheduled appointment time.   If you have a lab appointment with the [facility] please come in thru the Main Entrance and check in at the main information desk  You need to re-schedule your appointment should you arrive 10 or more minutes late.  We strive to give you quality time with our providers, and arriving late affects you and other patients whose appointments are after yours.  Also, if you no show three or more times for appointments you may be dismissed from the clinic at the providers discretion.     Again, thank you for choosing [facility].  Our hope is that these requests will decrease the amount of time that you wait before being seen by our physicians.       _____________________________________________________________  Should you have questions after your visit to [facility], please contact our office at [phone] between the hours of 8:00 a.m. and 4:30 p.m.  Voicemails left after 4:00 p.m. will not be returned until the following business day.  For prescription refill requests, have your pharmacy contact our office and allow 72 hours.    Cancer Center Support Programs:   > Cancer Support Group  2nd Tuesday of the month 1pm-2pm, Journey Room

## 2020-09-03 ENCOUNTER — Encounter (HOSPITAL_COMMUNITY): Payer: Self-pay

## 2020-09-03 NOTE — Progress Notes (Signed)
PDL1 ordered per Dr. Delton Coombes.

## 2020-09-10 ENCOUNTER — Telehealth: Payer: Self-pay | Admitting: *Deleted

## 2020-09-10 NOTE — Telephone Encounter (Signed)
CALLED PATIENT TO ASK IF SHE WANTS A TELEPHONE VISIT IN [place]. OR A MY-CHART VISIT, PATIENT STATES THAT SHE WANTS A TELEPHONE VISIT

## 2020-09-11 ENCOUNTER — Other Ambulatory Visit (HOSPITAL_COMMUNITY): Payer: Self-pay | Admitting: Surgery

## 2020-09-11 ENCOUNTER — Telehealth (HOSPITAL_COMMUNITY): Payer: Self-pay | Admitting: Surgery

## 2020-09-11 DIAGNOSIS — C021 Malignant neoplasm of border of tongue: Secondary | ICD-10-CM

## 2020-09-11 MED ORDER — OXYCODONE HCL 5 MG PO TABS: 5.0000 mg | ORAL_TABLET | Freq: Four times a day (QID) | ORAL | 0 refills | Status: DC | PRN

## 2020-09-11 NOTE — Telephone Encounter (Signed)
Pt had left a voicemail this morning stating that she had just gone yesterday to pick up her Oxycodone IR but that her pharmacy did not have enough capsules of the medication.  The pharmacy did however have enough tablets of the medication.  I routed a new prescription for Oxycodone IR tablets to Dr. Lindi Adie and explained the situation to him.  I called the pt to let her know we were working on getting her a new prescription but that Dr. Lindi Adie still needed to sign off on the medication.  She verbalized understanding, but voiced her concern that she may not be able to swallow the tablets if they are much larger than the capsules.  I told the pt Dr. Lindi Adie was still seeing patients but that he should be able to sign the new prescription soon, and that assuming she gets the medication today to call our office back and let us know if she can swallow the tablets.  She verbalized understanding of these instructions.

## 2020-09-12 ENCOUNTER — Ambulatory Visit (HOSPITAL_COMMUNITY): Payer: BC Managed Care – PPO

## 2020-09-12 ENCOUNTER — Ambulatory Visit (HOSPITAL_COMMUNITY): Payer: BC Managed Care – PPO | Admitting: Hematology and Oncology

## 2020-09-18 ENCOUNTER — Other Ambulatory Visit: Payer: Self-pay

## 2020-09-18 ENCOUNTER — Ambulatory Visit (HOSPITAL_COMMUNITY)
Admission: RE | Admit: 2020-09-18 | Discharge: 2020-09-18 | Disposition: A | Discharge status: Home / Self Care | Payer: BC Managed Care – PPO | Source: Ambulatory Visit | Attending: Hematology | Admitting: Hematology

## 2020-09-18 ENCOUNTER — Encounter (HOSPITAL_COMMUNITY): Payer: Self-pay

## 2020-09-18 ENCOUNTER — Inpatient Hospital Stay (HOSPITAL_COMMUNITY): Payer: BC Managed Care – PPO | Attending: Hematology

## 2020-09-18 DIAGNOSIS — C7800 Secondary malignant neoplasm of unspecified lung: Secondary | ICD-10-CM | POA: Diagnosis not present

## 2020-09-18 DIAGNOSIS — C021 Malignant neoplasm of border of tongue: Secondary | ICD-10-CM | POA: Diagnosis not present

## 2020-09-18 DIAGNOSIS — Z923 Personal history of irradiation: Secondary | ICD-10-CM | POA: Insufficient documentation

## 2020-09-18 DIAGNOSIS — C7989 Secondary malignant neoplasm of other specified sites: Secondary | ICD-10-CM | POA: Insufficient documentation

## 2020-09-18 DIAGNOSIS — Z5112 Encounter for antineoplastic immunotherapy: Secondary | ICD-10-CM | POA: Diagnosis not present

## 2020-09-18 DIAGNOSIS — Z9221 Personal history of antineoplastic chemotherapy: Secondary | ICD-10-CM | POA: Insufficient documentation

## 2020-09-18 DIAGNOSIS — K110 Atrophy of salivary gland: Secondary | ICD-10-CM | POA: Insufficient documentation

## 2020-09-18 DIAGNOSIS — C771 Secondary and unspecified malignant neoplasm of intrathoracic lymph nodes: Secondary | ICD-10-CM | POA: Diagnosis not present

## 2020-09-18 LAB — CBC WITH DIFFERENTIAL/PLATELET
Abs Immature Granulocytes: 0.02 10*3/uL (ref 0.00–0.07)
Basophils Absolute: 0 10*3/uL (ref 0.0–0.1)
Basophils Relative: 0 %
Eosinophils Absolute: 0.1 10*3/uL (ref 0.0–0.5)
Eosinophils Relative: 1 %
HCT: 39.6 % (ref 36.0–46.0)
Hemoglobin: 12.8 g/dL (ref 12.0–15.0)
Immature Granulocytes: 0 %
Lymphocytes Relative: 5 %
Lymphs Abs: 0.3 10*3/uL — ABNORMAL LOW (ref 0.7–4.0)
MCH: 30.1 pg (ref 26.0–34.0)
MCHC: 32.3 g/dL (ref 30.0–36.0)
MCV: 93.2 fL (ref 80.0–100.0)
Monocytes Absolute: 0.6 10*3/uL (ref 0.1–1.0)
Monocytes Relative: 11 %
Neutro Abs: 4.8 10*3/uL (ref 1.7–7.7)
Neutrophils Relative %: 83 %
Platelets: 222 10*3/uL (ref 150–400)
RBC: 4.25 MIL/uL (ref 3.87–5.11)
RDW: 12.6 % (ref 11.5–15.5)
WBC: 5.8 10*3/uL (ref 4.0–10.5)
nRBC: 0 % (ref 0.0–0.2)

## 2020-09-18 LAB — COMPREHENSIVE METABOLIC PANEL
ALT: 16 U/L (ref 0–44)
AST: 18 U/L (ref 15–41)
Albumin: 3.6 g/dL (ref 3.5–5.0)
Alkaline Phosphatase: 74 U/L (ref 38–126)
Anion gap: 8 (ref 5–15)
BUN: 30 mg/dL — ABNORMAL HIGH (ref 8–23)
CO2: 27 mmol/L (ref 22–32)
Calcium: 10 mg/dL (ref 8.9–10.3)
Chloride: 99 mmol/L (ref 98–111)
Creatinine, Ser: 0.72 mg/dL (ref 0.44–1.00)
GFR, Estimated: >60 mL/min (ref >60)
Glucose, Bld: 95 mg/dL (ref 70–99)
Potassium: 4.3 mmol/L (ref 3.5–5.1)
Sodium: 134 mmol/L — ABNORMAL LOW (ref 135–145)
Total Bilirubin: 0.4 mg/dL (ref 0.3–1.2)
Total Protein: 7.9 g/dL (ref 6.5–8.1)

## 2020-09-18 LAB — TSH: TSH: 4.156 u[IU]/mL (ref 0.350–4.500)

## 2020-09-18 MED ORDER — HEPARIN SOD (PORK) LOCK FLUSH 100 UNIT/ML IV SOLN
500.0000 [IU] | Freq: Once | INTRAVENOUS | Status: AC
  Administered 2020-09-18: 500 [IU] via INTRAVENOUS

## 2020-09-18 MED ORDER — SODIUM CHLORIDE 0.9% FLUSH
20.0000 mL | INTRAVENOUS | Status: DC | PRN
  Administered 2020-09-18: 20 mL via INTRAVENOUS

## 2020-09-18 MED ORDER — IOHEXOL 300 MG/ML  SOLN
75.0000 mL | Freq: Once | INTRAMUSCULAR | Status: AC | PRN
  Administered 2020-09-18: 75 mL via INTRAVENOUS

## 2020-09-18 NOTE — Progress Notes (Signed)
[place] Shown tolerated portacath lab draw with flush well without complaints or incident. Port left accessed,saline locked and flushed easily per protocol for use tomorrow. VSS Pt discharged self ambulatory in satisfactory condition

## 2020-09-18 NOTE — Patient Instructions (Signed)
Pembrolizumab Beryle Flock)   About This Drug Pembrolizumab is used to treat cancer. It is given in the vein (IV).  It takes 30 minutes to infuse.  Possible Side Effects  . Nausea . Diarrhea (loose bowel movements) . Constipation (not able to move bowels) . Pain in your abdomen . Tiredness . Fever . Decreased appetite (decreased hunger) . Muscle and bone pain . Trouble breathing . Cough . Rash . Itching  Note: Each of the side effects above was reported in 20% or greater of patients treated with pembrolizumab. Your side effects may be different if you are receiving pembrolizumab in combination with another chemotherapy agent. Not all possible side effects are included above.  Warnings and Precautions  . This drug works with your immune system and can cause inflammation in any of your organs and tissues and can change how they work. This may put you at risk for developing serious medical problems, which can be life-threatening.  . Inflammation in the colon (colitis), which can be life-threatening - symptoms are loose bowel movements (diarrhea) stomach cramping, and sometimes blood in the bowel movements  . Changes in liver function  . Changes in kidney function  . Inflammation (swelling) of the lungs, which can be life-threatening. You may have a dry cough or trouble breathing.  . This drug may affect some of your hormone glands (especially the thyroid, adrenals, pituitary and pancreas).  . Blood sugar levels may change, and you may develop diabetes. If you already have diabetes, changes may need to be made to your diabetes medication.  . Severe allergic skin reaction, which can be life-threatening. You may develop blisters on your skin that are filled with fluid or a severe red rash all over your body that may be painful.  . Increased risk of organ rejection in patients who have received donor organs  . Increased risk of complications in patients who will undergo a stem cell  transplant after receiving pembrolizumab.  . While you are getting this drug in your vein (IV), you may have a reaction to the drug. Your nurse ill check you closely for these signs: fever or shaking chills, flushing, facial swelling, feeling dizzy, headache, trouble breathing, rash, itching, chest tightness, or chest pain. These reactions may occur after your infusion. If this happens, call 911 for emergency care.  Note: Some of the side effects above are very rare. If you have concerns and/or questions, please discuss them with your medical team.  Important Information . This drug may be present in the saliva, tears, sweat, urine, stool, vomit, semen, and vaginal secretions. Talk to your doctor and/or your nurse about the necessary precautions to take during this time.  Treating Side Effects  . Drink plenty of fluids (a minimum of eight glasses per day is recommended).  . If you throw up or have loose bowel movements, you should drink more fluids so that you do not become dehydrated (lack of water in the body from losing too much fluid).  . To help with nausea and vomiting, eat small, frequent meals instead of three large meals a day. Choose foods and drinks that are at room temperature. Ask your nurse or doctor about other helpful tips and medicine that is available to help stop or lessen these symptoms.  . If you have diarrhea, eat low-fiber foods that are high in protein and calories and avoid foods that can irritate your digestive tracts or lead to cramping.  . If you are not able to move your  bowels, check with your doctor or nurse before you use any enemas, laxatives, or suppositories.  . Ask your doctor or nurse about medicines that are available to help stop or lessen constipation or diarrhea.  . To help with decreased appetite, eat small, frequent meals. Eat foods high in calories and protein, such as meat, poultry, fish, dry beans, tofu, eggs, nuts, milk, yogurt, cheese, ice cream,  pudding, and nutritional supplements.  . Consider using sauces and spices to increase taste. Daily exercise, with your doctor's approval, may increase your appetite.  . Manage tiredness by pacing your activities for the day. Be sure to include periods of rest between energy-draining activities.  [name] Keeping your pain under control is important to your wellbeing. Please tell your doctor or nurse if you are experiencing pain.  . If you have diabetes, keep good control of your blood sugar level. Tell your nurse or your doctor if your glucose levels are higher or lower than normal.  . If you get a rash do not put anything on it unless your doctor or nurse says you may. Keep the area around the rash clean and dry. Ask your doctor for medicine if your rash bothers you.  . Moisturize your skin several times a day  . Avoid sun exposure and apply sunscreen routinely when outdoors  . Infusion reactions may happen after your infusion. If this happens, call 911 for emergency care.   Food and Drug Interactions  . There are no known interactions of pembrolizumab with food.  . This drug may interact with other medicines. Tell your doctor and pharmacist about all the prescription and over-the-counter medicines and dietary supplements (vitamins, minerals, herbs and others) that you are taking at this time. Also, check with your doctor or pharmacist before starting any new prescription or over-the-counter medicines, or dietary supplements to make sure that there are no interactions.  When to Call the Doctor  Call your doctor or nurse if you have any of the following symptoms and/or any new or unusual symptoms:  . Fever of 100.4 F (38 C) or higher  . Chills  . Headache that does not go away  . Tiredness that interferes with your daily activities  . Feeling dizzy or lightheaded  . Wheezing or trouble breathing  . Chest pain  . Dry cough  . Coughing up yellow, green or bloody mucus  .  Nausea that stops you from eating or drinking, and/or that is not relieved by prescribed medicines  . Lasting loss of appetite or rapid weight loss of five pounds in a week  . Diarrhea, 4 times in one day or diarrhea with lack of strength or a feeling of being dizzy  . Blood in your stool  . No bowel movement for 3 days or when you feel uncomfortable  . Extreme weakness that interferes with normal activities  . Decreased urine  . Abnormal blood sugar  . Unusual thirst, passing urine often, headache, sweating, shakiness, irritability  . A new rash and/or itching  . Rash that is not relieved by prescribed medicines  . Pain that does not go away or is not relieved by prescribed medicines, especially in the upper right abdomen  . Flu-like symptoms: fever, headache, muscle and joint aches, and fatigue (low energy, feeling weak)   . Signs of liver problems: dark urine, pale bowel movements, bad stomach pain, feeling very tired and weak, unusual itching, or yellowing of the eyes or skin  . Signs of infusion reactions  such as fever or shaking chills, flushing, facial swelling, feeling dizzy, headache, trouble breathing, rash, itching, chest tightness, or chest pain. If this happens, call 911 for emergency care.  . If you think you are pregnant  Reproduction Warnings  . Pregnancy warning: This drug may have harmful effects on the unborn baby. Women of childbearing potential should use effective methods of birth control during your cancer treatment and for at least 4 months after treatment. Let your doctor know right away if you think you may be pregnant.  . Breast feeding warning: It is not known if this drug passes into breast milk. For this reason, women should not breastfeed during treatment and for 4 months after treatment because this drug could enter the breast milk and cause harm to a breastfeeding baby.  . Fertility warning: Human fertility studies have not been done with this  drug. Talk with your doctor or nurse if you plan to have children.   SELF CARE ACTIVITIES WHILE ON IMMUNOTHERAPY:  Hydration Increase your fluid intake 48 hours prior to treatment and drink at least 8 to 12 cups (64 ounces) of water/decaffeinated beverages per day after treatment. You can still have your cup of coffee or soda but these beverages do not count as part of your 8 to 12 cups that you need to drink daily. No alcohol intake.  Medications Continue taking your normal prescription medication as prescribed.  If you start any new herbal or new supplements please let us know first to make sure it is safe.  Mouth Care Have teeth cleaned professionally before starting treatment. Keep dentures and partial plates clean. Use soft toothbrush and do not use mouthwashes that contain alcohol. Biotene is a good mouthwash that is available at most pharmacies or may be ordered by calling [phone]. Use warm salt water gargles (1 teaspoon salt per 1 quart warm water) before and after meals and at bedtime. Or you may rinse with 2 tablespoons of three-percent hydrogen peroxide mixed in eight ounces of water. If you are still having problems with your mouth or sores in your mouth please call the clinic. If you need dental work, please let the doctor know before you go for your appointment so that we can coordinate the best possible time for you in regards to your chemo regimen. You need to also let your dentist know that you are actively taking chemo. We may need to do labs prior to your dental appointment.  Skin Care Always use sunscreen that has not expired and with SPF (Sun Protection Factor) of 50 or higher. Wear hats to protect your head from the sun. Remember to use sunscreen on your hands, ears, face, & feet.  Use good moisturizing lotions such as udder cream, eucerin, or even Vaseline. Some chemotherapies can cause dry skin, color changes in your skin and nails.    . Avoid long, hot showers or  baths. . Use gentle, fragrance-free soaps and laundry detergent. . Use moisturizers, preferably creams or ointments rather than lotions because the thicker consistency is better at preventing skin dehydration. Apply the cream or ointment within 15 minutes of showering. Reapply moisturizer at night, and moisturize your hands every time after you wash them.   Infection Prevention Please wash your hands for at least 30 seconds using warm soapy water. Handwashing is the #1 way to prevent the spread of germs. Stay away from sick people or people who are getting over a cold. If you develop respiratory systems such as green/yellow  mucus production or productive cough or persistent cough let us know and we will see if you need an antibiotic. It is a good idea to keep a pair of gloves on when going into grocery stores/[employer] to decrease your risk of coming into contact with germs on the carts, etc. Carry alcohol hand gel with you at all times and use it frequently if out in public. If your temperature reaches 100.5 or higher please call the clinic and let us know.  If it is after hours or on the weekend please go to the ER if your temperature is over 100.4.  Please have your own personal thermometer at home to use.    Sex and bodily fluids If you are going to have sex, a condom must be used to protect the person that isn't taking immunotherapy. For a few days after treatment, immunotherapy can be excreted through your bodily fluids.  When using the toilet please close the lid and flush the toilet twice.  Do this for a few day after you have had immunotherapy.   Contraception It is not known for sure whether or not immunotherapy drugs can be passed on through semen or secretions from the vagina. Because of this some doctors advise people to use a barrier method if you have sex during treatment. This applies to vaginal, anal or oral sex.  Generally, doctors advise a barrier method only for the time you are  actually having the treatment and for about a week after your treatment.  Advice like this can be worrying, but this does not mean that you have to avoid being intimate with your partner. You can still have close contact with your partner and continue to enjoy sex.  Animals If you have cats or birds we just ask that you not change the litter or change the cage.  Please have someone else do this for you while you are on immunotherapy.   Food Safety During and After Cancer Treatment Food safety is important for people both during and after cancer treatment. Cancer and cancer treatments, such as chemotherapy, radiation therapy, and stem cell/bone marrow transplantation, often weaken the immune system. This makes it harder for your body to protect itself from foodborne illness, also called food poisoning. Foodborne illness is caused by eating food that contains harmful bacteria, parasites, or viruses.  Foods to avoid Some foods have a higher risk of becoming tainted with bacteria. These include: [name] Unwashed fresh fruit and vegetables, especially leafy vegetables that can hide dirt and other contaminants  Raw sprouts, such as alfalfa sprouts . Raw or undercooked beef, especially ground beef, or other raw or undercooked meat and poultry . Fatty, fried, or spicy foods immediately before or after treatment.  These can sit heavy on your stomach and make you feel nauseous. . Raw or undercooked shellfish, such as oysters. . Sushi and sashimi, which often contain raw fish.  . Unpasteurized beverages, such as unpasteurized fruit juices, raw milk, raw yogurt, or cider . Undercooked eggs, such as soft boiled, over easy, and poached; raw, unpasteurized eggs; or foods made with raw egg, such as homemade raw cookie dough and homemade mayonnaise  Simple steps for food safety  Shop smart. . Do not buy food stored or displayed in an unclean area. . Do not buy bruised or damaged fruits or vegetables. . Do not  buy cans that have cracks, dents, or bulges. . Pick up foods that can spoil at the end of your shopping trip and  store them in a cooler on the way home.  Prepare and clean up foods carefully. . Rinse all fresh fruits and vegetables under running water, and dry them with a clean towel or paper towel. . Clean the top of cans before opening them. . After preparing food, wash your hands for 20 seconds with hot water and soap. Pay special attention to areas between fingers and under nails. . Clean your utensils and dishes with hot water and soap. [name] Disinfect your kitchen and cutting boards using 1 teaspoon of liquid, unscented bleach mixed into 1 quart of water.    Dispose of old food. . Eat canned and packaged food before its expiration date (the "use by" or "best before" date). . Consume refrigerated leftovers within 3 to 4 days. After that time, throw out the food. Even if the food does not smell or look spoiled, it still may be unsafe. Some bacteria, such as Listeria, can grow even on foods stored in the refrigerator if they are kept for too long.  Take precautions when eating out. . At restaurants, avoid buffets and salad bars where food sits out for a long time and comes in contact with many people. Food can become contaminated when someone with a virus, often a norovirus, or another "bug" handles it. . Put any leftover food in a "to-go" container yourself, rather than having the server do it. And, refrigerate leftovers as soon as you get home. . Choose restaurants that are clean and that are willing to prepare your food as you order it cooked.    SYMPTOMS TO REPORT AS SOON AS POSSIBLE AFTER TREATMENT:   FEVER GREATER THAN 100.4 F  CHILLS WITH OR WITHOUT FEVER  NAUSEA AND VOMITING THAT IS NOT CONTROLLED WITH YOUR NAUSEA MEDICATION  UNUSUAL SHORTNESS OF BREATH  UNUSUAL BRUISING OR BLEEDING  TENDERNESS IN MOUTH AND THROAT WITH OR WITHOUT PRESENCE OF ULCERS  URINARY  PROBLEMS  BOWEL PROBLEMS  UNUSUAL RASH     Wear comfortable clothing and clothing appropriate for easy access to any Portacath or PICC line. Let us know if there is anything that we can do to make your therapy better!   What to do if you need assistance after hours or on the weekends: CALL [phone].  HOLD on the line, do not hang up.  You will hear multiple messages but at the end you will be connected with a nurse triage line.  They will contact the doctor if necessary.  Most of the time they will be able to assist you.  Do not call the hospital operator.     I have been informed and understand all of the instructions given to me and have received a copy. I have been instructed to call the clinic [phone] or my family physician as soon as possible for continued medical care, if indicated. I do not have any more questions at this time but understand that I may call the [facility] or the Patient Navigator at [phone] during office hours should I have questions or need assistance in obtaining follow-up care.

## 2020-09-18 NOTE — Patient Instructions (Signed)
[place] at [facility] Discharge Instructions  Labs drawn from portacath today for CT scan. Follow-up as scheduled  Thank you for choosing [place] at [facility] to provide your oncology and hematology care.  To afford each patient quality time with our provider, please arrive at least 15 minutes before your scheduled appointment time.   If you have a lab appointment with the [facility] please come in thru the Main Entrance and check in at the main information desk.  You need to re-schedule your appointment should you arrive 10 or more minutes late.  We strive to give you quality time with our providers, and arriving late affects you and other patients whose appointments are after yours.  Also, if you no show three or more times for appointments you may be dismissed from the clinic at the providers discretion.     Again, thank you for choosing [facility].  Our hope is that these requests will decrease the amount of time that you wait before being seen by our physicians.       _____________________________________________________________  Should you have questions after your visit to [facility], please contact our office at [phone] and follow the prompts.  Our office hours are 8:00 a.m. and 4:30 p.m. Monday - Friday.  Please note that voicemails left after 4:00 p.m. may not be returned until the following business day.  We are closed weekends and major holidays.  You do have access to a nurse 24-7, just call the main number to the clinic [phone] and do not press any options, hold on the line and a nurse will answer the phone.    For prescription refill requests, have your pharmacy contact our office and allow 72 hours.    Due to Covid, you will need to wear a mask upon entering the hospital. If you do not have a mask, a mask will be given to you at the Main Entrance upon arrival. For doctor visits, patients  may have 1 support person age 69 or older with them. For treatment visits, patients can not have anyone with them due to social distancing guidelines and our immunocompromised population.

## 2020-09-18 NOTE — Progress Notes (Signed)
.   Pharmacist Chemotherapy Monitoring - Initial Assessment    Anticipated start date: 09/19/20   Regimen:  . Are orders appropriate based on the patient's diagnosis, regimen, and cycle? Yes . Does the plan date match the patient's scheduled date? Yes . Is the sequencing of drugs appropriate? Yes . Are the premedications appropriate for the patient's regimen? Yes . Prior Authorization for treatment is: Approved o If applicable, is the correct biosimilar selected based on the patient's insurance? not applicable  Organ Function and Labs: [name] Are dose adjustments needed based on the patient's renal function, hepatic function, or hematologic function? No . Are appropriate labs ordered prior to the start of patient's treatment? Yes . Other organ system assessment, if indicated: N/A . The following baseline labs, if indicated, have been ordered: pembrolizumab: baseline TSH +/- T4  Dose Assessment: . Are the drug doses appropriate? Yes . Are the following correct: o Drug concentrations Yes o IV fluid compatible with drug Yes o Administration routes Yes o Timing of therapy Yes . If applicable, does the patient have documented access for treatment and/or plans for port-a-cath placement? yes . If applicable, have lifetime cumulative doses been properly documented and assessed? not applicable Lifetime Dose Tracking  No doses have been documented on this patient for the following tracked chemicals: Doxorubicin, Epirubicin, Idarubicin, Daunorubicin, Mitoxantrone, Bleomycin, Oxaliplatin, Carboplatin, Liposomal Doxorubicin  o   Toxicity Monitoring/Prevention: . The patient has the following take home antiemetics prescribed: N/A . The patient has the following take home medications prescribed: N/A . Medication allergies and previous infusion related reactions, if applicable, have been reviewed and addressed. Yes . The patient's current medication list has been assessed for drug-drug interactions with  their chemotherapy regimen. no significant drug-drug interactions were identified on review.  Order Review: . Are the treatment plan orders signed? No . Is the patient scheduled to see a provider prior to their treatment? Yes  I verify that I have reviewed each item in the above checklist and answered each question accordingly.  Wynona Neat 09/18/2020 2:52 PM

## 2020-09-19 ENCOUNTER — Inpatient Hospital Stay (HOSPITAL_COMMUNITY): Payer: BC Managed Care – PPO

## 2020-09-19 ENCOUNTER — Inpatient Hospital Stay (HOSPITAL_BASED_OUTPATIENT_CLINIC_OR_DEPARTMENT_OTHER): Payer: BC Managed Care – PPO | Admitting: Oncology

## 2020-09-19 VITALS — BP 124/67 | HR 64 | Temp 96.8°F | Resp 17 | Wt 137.6 lb

## 2020-09-19 VITALS — BP 133/55 | HR 63 | Temp 97.3°F | Resp 17

## 2020-09-19 DIAGNOSIS — Z95828 Presence of other vascular implants and grafts: Secondary | ICD-10-CM

## 2020-09-19 DIAGNOSIS — C021 Malignant neoplasm of border of tongue: Secondary | ICD-10-CM | POA: Diagnosis not present

## 2020-09-19 DIAGNOSIS — C023 Malignant neoplasm of anterior two-thirds of tongue, part unspecified: Secondary | ICD-10-CM | POA: Diagnosis not present

## 2020-09-19 MED ORDER — HEPARIN SOD (PORK) LOCK FLUSH 100 UNIT/ML IV SOLN
500.0000 [IU] | Freq: Once | INTRAVENOUS | Status: AC | PRN
Start: 2020-09-19 — End: 2020-09-19
  Administered 2020-09-19: 15:00:00 500 [IU]

## 2020-09-19 MED ORDER — SODIUM CHLORIDE 0.9 % IV SOLN: Freq: Once | INTRAVENOUS | Status: AC

## 2020-09-19 MED ORDER — SODIUM CHLORIDE 0.9% FLUSH
10.0000 mL | INTRAVENOUS | Status: DC | PRN
  Administered 2020-09-19 (×2): 10 mL

## 2020-09-19 MED ORDER — SODIUM CHLORIDE 0.9 % IV SOLN
200.0000 mg | Freq: Once | INTRAVENOUS | Status: AC
  Administered 2020-09-19: 14:00:00 200 mg via INTRAVENOUS
  Filled 2020-09-19: qty 8

## 2020-09-19 NOTE — Progress Notes (Signed)
Treatment given today per MD orders.  Tolerated infusion without adverse affects.  Vital signs stable.  No complaints at this time.  Discharge from clinic ambulatory in stable condition.  Alert and oriented X 3.  Follow up with [facility] as scheduled. 

## 2020-09-19 NOTE — Patient Instructions (Signed)
[facility] Discharge Instructions for Patients Receiving Chemotherapy  Today you received the following chemotherapy agents   To help prevent nausea and vomiting after your treatment, we encourage you to take your nausea medication   If you develop nausea and vomiting that is not controlled by your nausea medication, call the clinic.   BELOW ARE SYMPTOMS THAT SHOULD BE REPORTED IMMEDIATELY:  *FEVER GREATER THAN 100.5 F  *CHILLS WITH OR WITHOUT FEVER  NAUSEA AND VOMITING THAT IS NOT CONTROLLED WITH YOUR NAUSEA MEDICATION  *UNUSUAL SHORTNESS OF BREATH  *UNUSUAL BRUISING OR BLEEDING  TENDERNESS IN MOUTH AND THROAT WITH OR WITHOUT PRESENCE OF ULCERS  *URINARY PROBLEMS  *BOWEL PROBLEMS  UNUSUAL RASH Items with * indicate a potential emergency and should be followed up as soon as possible.  Feel free to call the clinic should you have any questions or concerns. The clinic phone number is [phone].  Please show the CHEMO ALERT CARD at check-in to the Emergency Department and triage nurse.   

## 2020-09-19 NOTE — Progress Notes (Signed)
Patient seen by Dr. Benay Spice today. D1 C1 Keytruda. Labs within parameters for treatment. MAR reviewed. Patient's vital signs within parameters for treatment. Patient denies any significant changes since her last visit. Consent obtained for treatment. Chemotherapy teaching folder given to patient and copy of consent given. Patient verbalized an understanding of symptom management .

## 2020-09-19 NOTE — Progress Notes (Signed)
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  She has an enlarging right submandibular mass.  She has pain associated with this mass.  The pain is not relieved when she takes 5 mg of oxycodone every 6 hours.   No other complaint.  Objective:  Vital signs in last 24 hours:  Blood pressure 124/67, pulse 64, temperature (!) 96.8 F (36 C), temperature source Temporal, resp. rate 17, weight 137 lb 9.6 oz (62.4 kg), SpO2 100 %.    HEENT: Fungating mass at the right submandibular area, radiation changes at the right neck Lymphatics: No cervical, supraclavicular, or axillary nodes Resp: Decreased breath sounds at the left compared to the right posterior chest, no respiratory distress Cardio: Regular rate and rhythm GI: No hepatosplenomegaly Vascular: No leg edema  Portacath/PICC-without erythema  Lab Results:  Lab Results  Component Value Date   WBC 5.8 09/18/2020   HGB 12.8 09/18/2020   HCT 39.6 09/18/2020   MCV 93.2 09/18/2020   PLT 222 09/18/2020   NEUTROABS 4.8 09/18/2020    CMP  Lab Results  Component Value Date   NA 134 (L) 09/18/2020   K 4.3 09/18/2020   CL 99 09/18/2020   CO2 27 09/18/2020   GLUCOSE 95 09/18/2020   BUN 30 (H) 09/18/2020   CREATININE 0.72 09/18/2020   CALCIUM 10.0 09/18/2020   PROT 7.9 09/18/2020   ALBUMIN 3.6 09/18/2020   AST 18 09/18/2020   ALT 16 09/18/2020   ALKPHOS 74 09/18/2020   BILITOT 0.4 09/18/2020   GFRNONAA >60 09/18/2020   GFRAA >60 05/21/2020      Imaging:  CT SOFT TISSUE NECK W CONTRAST  Result Date: 09/18/2020 CLINICAL DATA:  Head and neck cancer, surveillance. Squamous cell carcinoma of the tongue with mass to the lung status post chemo radiation and surgery. Pain right mandible. EXAM: CT NECK WITH CONTRAST TECHNIQUE: Multidetector CT imaging of the neck was performed using the standard protocol following  the bolus administration of intravenous contrast. CONTRAST:  58m OMNIPAQUE IOHEXOL 300 MG/ML  SOLN COMPARISON:  CT of the neck June 08, 2020. FINDINGS: Pharynx and larynx: Significant interval increase of the heterogeneously enhancing mass lesion in the right submandibular region extending into the right sublingual region with mass effect in the right vallecula and the lesion measures approximately 6.1 x 4.7 x 4.3 cm and corresponding to focus of hypermetabolism on recent FDG PET. The lesion contacts the hyoid bone which appear intact and extends to involve the skin in the submandibular region the mandible also appear preserved. Salivary glands: Surgically absent right submandibular gland. Atrophic left submandibular gland, unchanged. The bilateral parotid glands appear preserved. Thyroid: Multiple hypodense thyroid nodules, unchanged. Lymph nodes: Postsurgical changes from right neck dissection. Lesion no enlarged lymph node outside the right submandibular region. Vascular: Soft tissue thickening around the right carotid sheath likely representing posttreatment changes. Major neck vessels are patent. Limited intracranial: Negative. Visualized orbits: Negative. Mastoids and visualized paranasal sinuses: Clear. Skeleton: Degenerative changes of the cervical spine. No aggressive bone lesion identified. Upper chest: Negative. IMPRESSION: 1. Significant interval increase of the right submandibular metastatic lymph node now extending into the right sublingual region. 2. Postsurgical changes from right neck dissection. No enlarged lymph node outside the right submandibular region. Electronically Signed   By: KPedro EarlsM.D.   On: 09/18/2020 17:50    Medications: I have reviewed  the patient's current medications.   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  Nicolette Bang -Pathology showed 4.2 cm tumor with margins negative, 17 mm depth of invasion, moderately to poorly differentiated, positive neural invasion, 2/17 lymph nodes positive, ECE positive, PT 4 APN 3B. -IMRT with weekly cisplatin from 06/27/2019 through 07/25/2019. -PET scan on 11/04/2019 showed hypermetabolic level 1B lymph node in the right submental region measuring 5 mm. Left upper lobe pulmonary nodule measuring 8 mm. -PET scan on 02/13/2020 with ill-defined 9 mm short axis level 1B node with SUV 8.6, previously 5.6 SUV. 2.1 cm short axis superior mediastinal/prevascular node previously 8 mm SUV 13.2. -Bronchoscopy on 03/06/2020 with biopsy of the left lung lesion consistent with squamous cell carcinoma. Right lung lesion shows atypical cells. -XRT to the right and left lung lesions completed on 04/02/2020 -She met with Dr. Lorrene Reid at [facility] who offered surgical resection.  Patient was reluctant to consider it given morbidity associated with it. -PET scan on 08/27/2020 showed 19 mm short axis right submandibular node, maximum SUV 12.0, previously 11.5.  PET scan on 02/13/2020 showed SUV of 7.32, with lymph node measuring 9 mm in short axis.  Mild hypermetabolism along the right lateral chest wall, SUV 4.2.  7 mm short axis anterior mediastinal/prevascular lymph node, maximum SUV 4.1. -CT neck 09/18/2020-increase in right submandibular metastatic lymph node extending into the right sublingual region, postsurgical changes from right neck dissection -Cycle 1 pembrolizumab 09/19/2020  2.  Pain secondary to the right submandibular mass     Disposition: Terri [name] has metastatic head neck cancer.  The restaging PET scan on 08/27/2020 revealed evidence of metastatic disease involving a right submandibular mass and anterior mediastinal lymph node.  The neck CT yesterday confirmed a right submandibular mass extending to the right sublingual region.  I reviewed the CT images with Ms.  Saravia and her son.  She is scheduled to begin treatment with pembrolizumab today.  We reviewed potential toxicities associated with pembrolizumab including the chance of allergic reaction, rash, diarrhea, and various autoimmune toxicities.  She agrees to proceed.  She will return for an office visit and pembrolizumab in 3 weeks.  I recommended she increase the oxycodone to 5-10 mg every 4 hours as needed.  She will contact us if this does not relieve the pain and we will prescribe hydromorphone.  Betsy Coder, MD  09/19/2020  11:45 AM

## 2020-09-21 ENCOUNTER — Telehealth: Payer: Self-pay | Admitting: Oncology

## 2020-09-21 NOTE — Telephone Encounter (Signed)
Scheduled appt per 12/17 sch msg - pt is aware of appt date and time

## 2020-09-24 ENCOUNTER — Telehealth: Payer: Self-pay | Admitting: *Deleted

## 2020-09-24 ENCOUNTER — Other Ambulatory Visit: Payer: Self-pay | Admitting: Oncology

## 2020-09-24 MED ORDER — HYDROMORPHONE HCL 2 MG PO TABS: 2.0000 mg | ORAL_TABLET | ORAL | 0 refills | Status: DC | PRN

## 2020-09-24 NOTE — Telephone Encounter (Signed)
Reports she has been taking the oxycodone 10 mg every 4 hours and still not getting adequate pain relief. Asking for the hydromorphone to be ordered as was discussed at her recent appointment.

## 2020-09-26 ENCOUNTER — Encounter: Payer: Self-pay | Admitting: *Deleted

## 2020-09-26 NOTE — Progress Notes (Signed)
Faxed notification from [facility] that hydromorphone 2 mg has been approved from 09/25/20 to 09/25/2021. Notified pharmacy via VM

## 2020-10-08 IMAGING — CT CT CHEST SUPER D W/O CM
2 of 4 series · 15 of 36 positions shown, 18 images · non-contrast
Comparison: PET 02/13/2020, 11/04/2019 and CT neck 11/11/2019.

CLINICAL DATA: Lung nodule. Pre navigational bronchoscopy. Squamous
cell carcinoma of the tongue treated with chemotherapy, radiation
therapy and surgery.

EXAM:
CT CHEST WITHOUT CONTRAST
TECHNIQUE: Multidetector CT imaging of the chest was performed using thin slice
collimation for electromagnetic bronchoscopy planning purposes,
without intravenous contrast.

[Series 3: super d-thins · axial · 0.54mm/px · z∈[+1302,+1586]mm · 12 of 397 slices shown, 15 images]
[im 21/397  mediastinal]
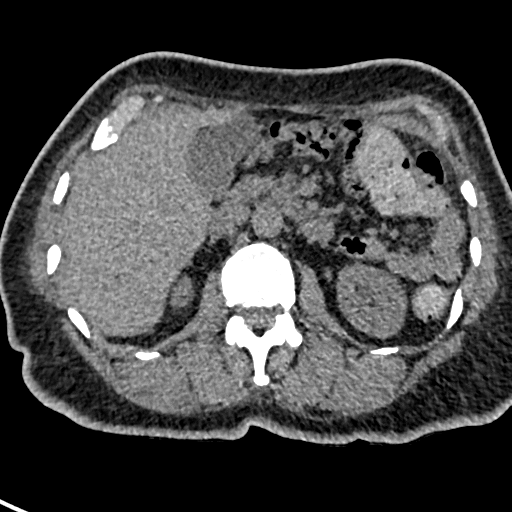
[im 21/397  lung]
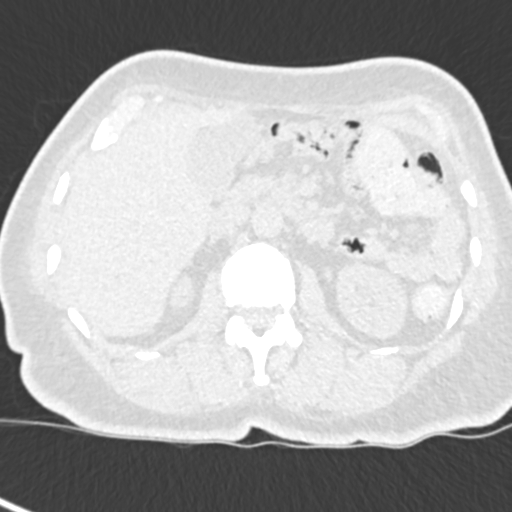
[im 63/397  lung]
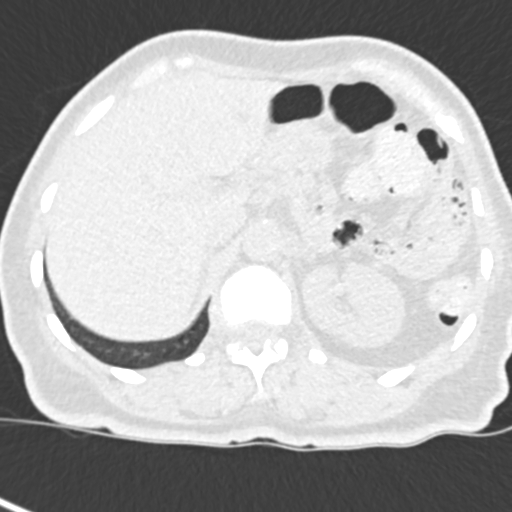
[im 84/397  lung]
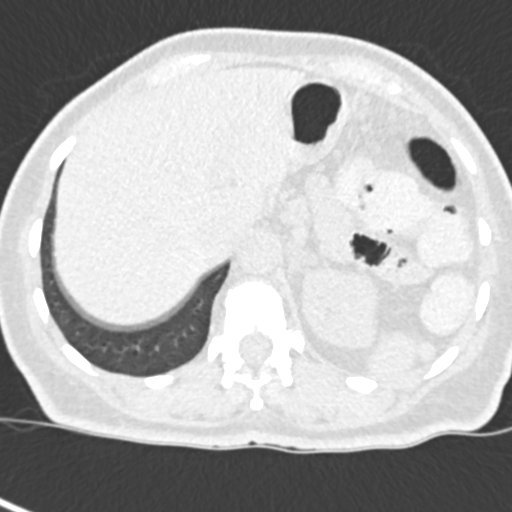
[im 126/397  lung]
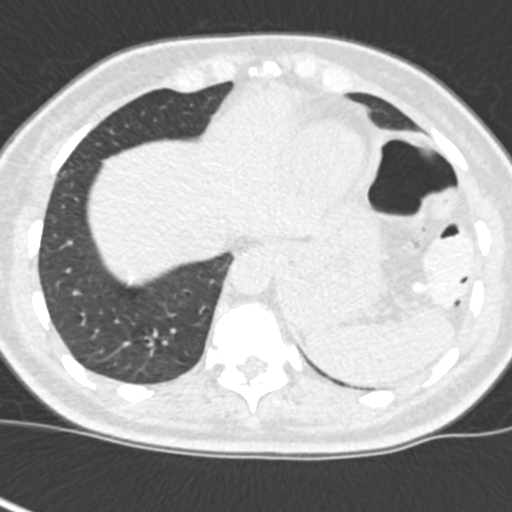
[im 146/397  mediastinal]
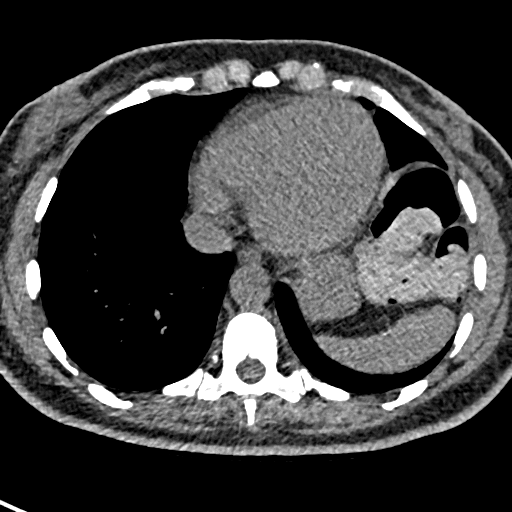
[im 146/397  lung]
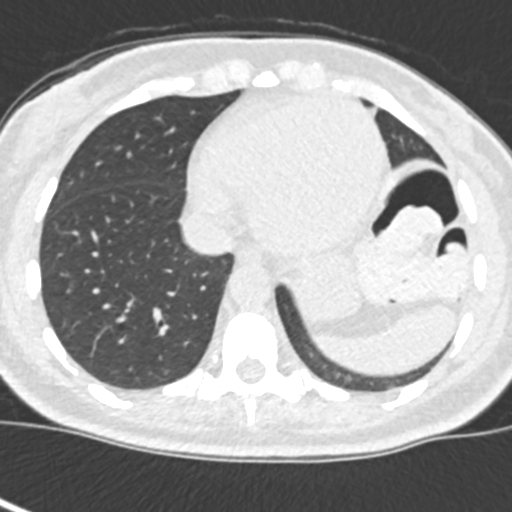
[im 188/397  lung]
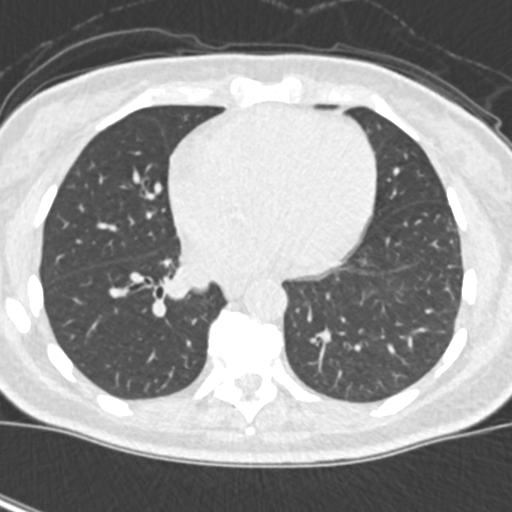
[im 209/397  lung]
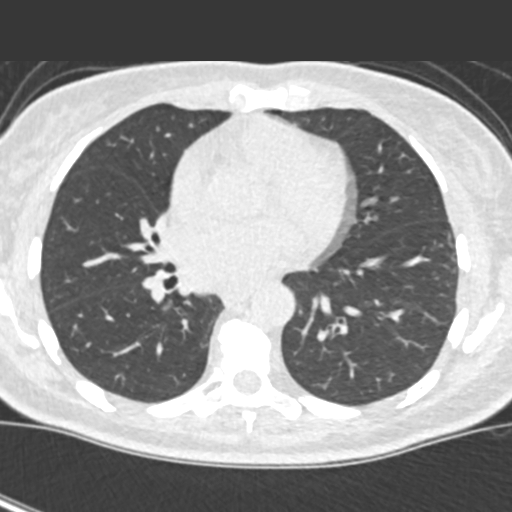
[im 251/397  lung]
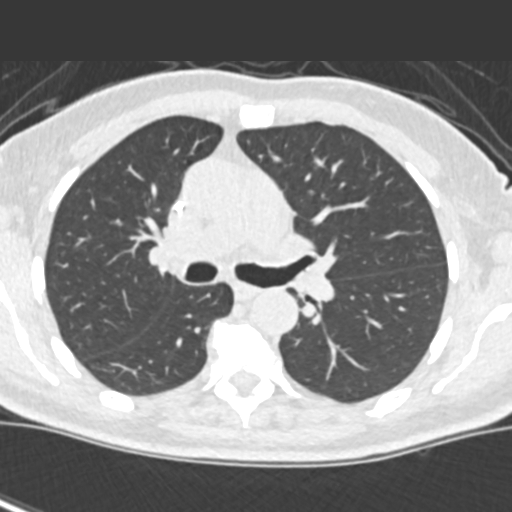
[im 271/397  mediastinal]
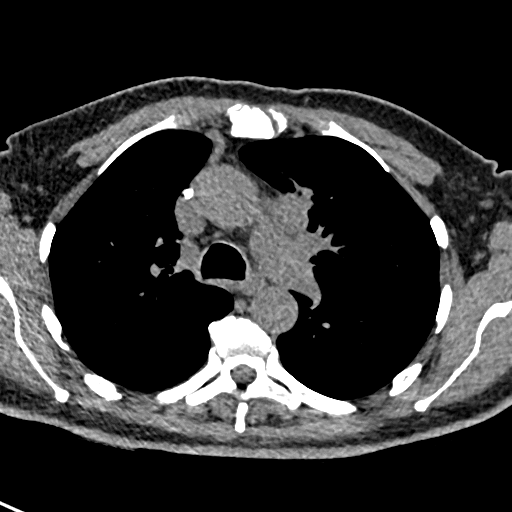
[im 271/397  lung]
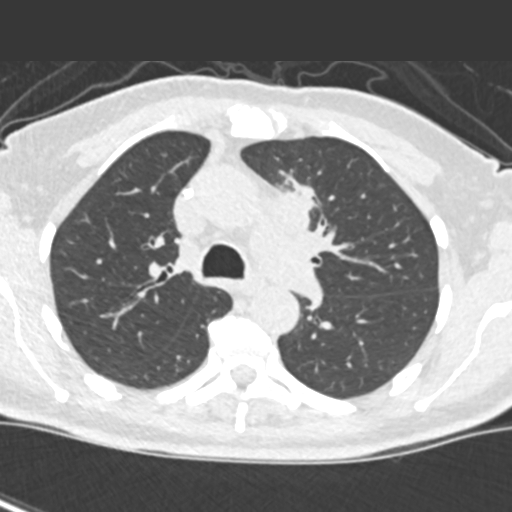
[im 313/397  lung]
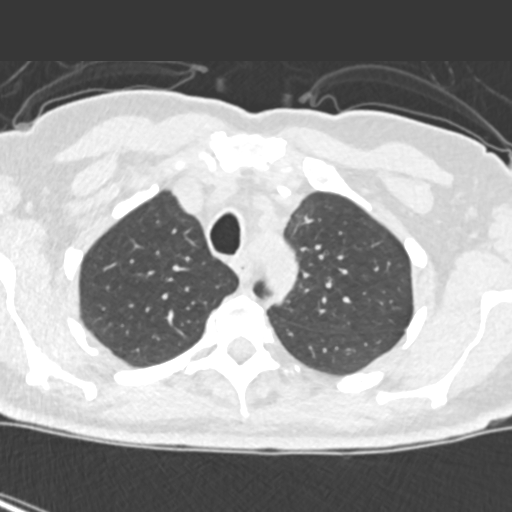
[im 334/397  lung]
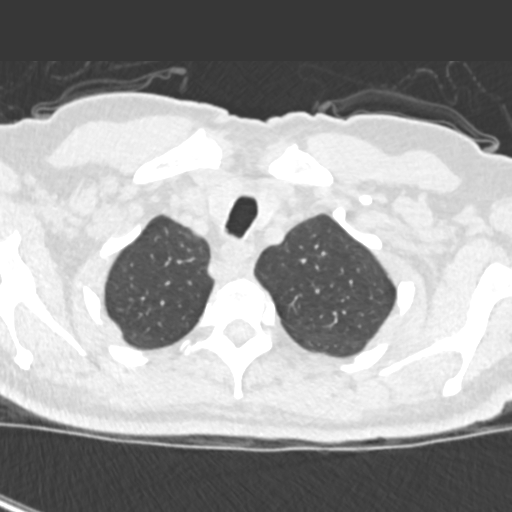
[im 376/397  lung]
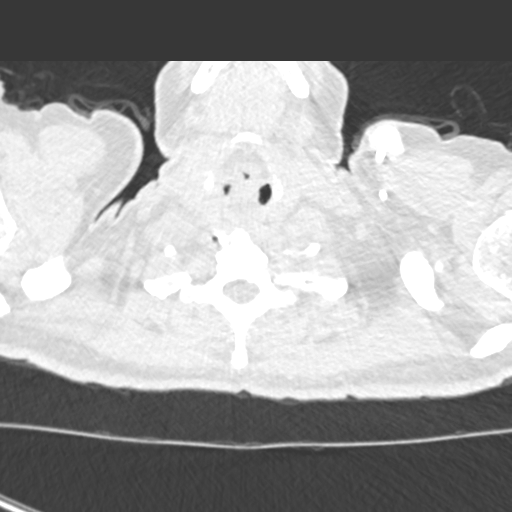

[Series 5: coronal · coronal · 0.62mm/px · 3 of 131 slices shown]
[im 27/131  lung]
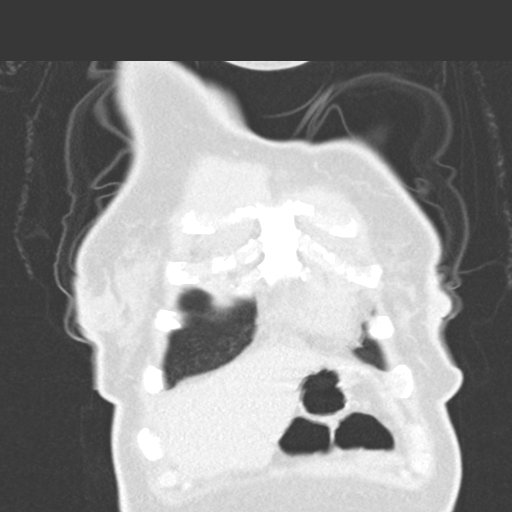
[im 53/131  lung]
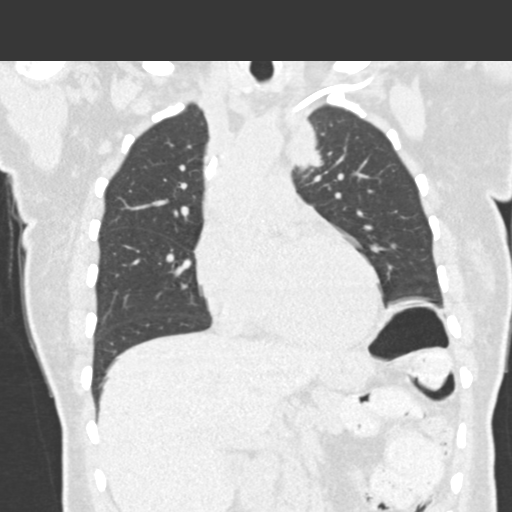
[im 79/131  lung]
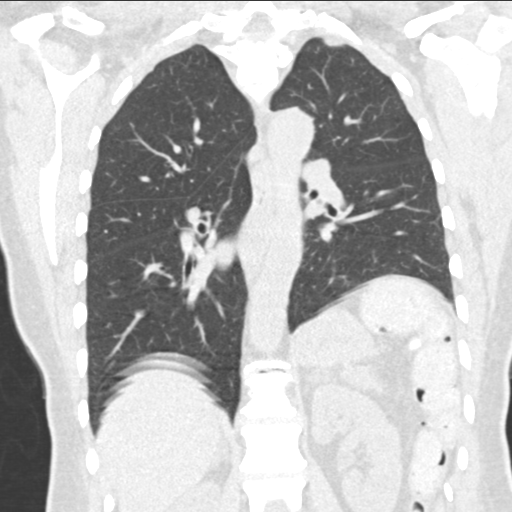

[15 of 36 positions shown; findings below may reference images not displayed]

FINDINGS: Cardiovascular: Left IJ Port-A-Cath terminates in the low SVC.
Aberrant right subclavian artery. Atherosclerotic calcification of
the aorta. Heart is at the upper limits of normal in size. No
pericardial effusion.

Mediastinum/Nodes: No pathologically enlarged mediastinal or
axillary lymph nodes. Hilar regions are difficult to evaluate
without IV contrast. Esophagus is grossly unremarkable.

Lungs/Pleura: Medial left upper lobe mass invades the mediastinum,
measures 2.2 x 3.1 cm (4/47) and is stable in size from 02/13/2020.
Irregular right upper lobe nodule is seen peripherally, measuring 8
x 10 mm (4/46), also stable from 02/13/2020. Lungs are otherwise
clear. No pleural fluid. Airway is unremarkable.

Upper Abdomen: Visualized portions of the liver and gallbladder are
unremarkable. There may be mild thickening of both adrenal glands.
Visualized portions of the kidneys, spleen, pancreas, stomach and
bowel are otherwise unremarkable. Left hemidiaphragm is elevated,
stable from 02/13/2020 but new from 11/04/2019.

Musculoskeletal: Degenerative changes in the spine. No worrisome
lytic or sclerotic lesions.
IMPRESSION: 1. Left upper lobe mass and right upper lobe nodule may represent
metastatic squamous cell carcinoma or synchronous primary
bronchogenic carcinomas.
2. Left upper lobe mass invades the mediastinum and likely involves
the left phrenic nerve given left hemidiaphragm elevation, new from
11/04/2019.
3.  Aortic atherosclerosis ([id number]).

## 2020-10-11 ENCOUNTER — Ambulatory Visit (HOSPITAL_COMMUNITY): Payer: BC Managed Care – PPO

## 2020-10-11 ENCOUNTER — Ambulatory Visit (HOSPITAL_COMMUNITY): Payer: BC Managed Care – PPO | Admitting: Hematology

## 2020-10-11 ENCOUNTER — Other Ambulatory Visit (HOSPITAL_COMMUNITY): Payer: BC Managed Care – PPO

## 2020-10-12 ENCOUNTER — Inpatient Hospital Stay: Payer: BC Managed Care – PPO | Attending: Nurse Practitioner

## 2020-10-12 ENCOUNTER — Inpatient Hospital Stay: Payer: BC Managed Care – PPO

## 2020-10-12 ENCOUNTER — Inpatient Hospital Stay: Payer: BC Managed Care – PPO | Admitting: Nurse Practitioner

## 2020-10-12 ENCOUNTER — Encounter: Payer: Self-pay | Admitting: Nurse Practitioner

## 2020-10-12 ENCOUNTER — Other Ambulatory Visit: Payer: Self-pay | Admitting: Nurse Practitioner

## 2020-10-12 ENCOUNTER — Other Ambulatory Visit: Payer: Self-pay

## 2020-10-12 VITALS — BP 129/80 | HR 100 | Temp 98.1°F | Resp 20 | Ht 65.0 in | Wt 135.6 lb

## 2020-10-12 DIAGNOSIS — G893 Neoplasm related pain (acute) (chronic): Secondary | ICD-10-CM | POA: Diagnosis not present

## 2020-10-12 DIAGNOSIS — C023 Malignant neoplasm of anterior two-thirds of tongue, part unspecified: Secondary | ICD-10-CM

## 2020-10-12 DIAGNOSIS — Z23 Encounter for immunization: Secondary | ICD-10-CM | POA: Diagnosis not present

## 2020-10-12 DIAGNOSIS — Z5112 Encounter for antineoplastic immunotherapy: Secondary | ICD-10-CM | POA: Diagnosis not present

## 2020-10-12 DIAGNOSIS — Z923 Personal history of irradiation: Secondary | ICD-10-CM | POA: Insufficient documentation

## 2020-10-12 DIAGNOSIS — R6884 Jaw pain: Secondary | ICD-10-CM

## 2020-10-12 DIAGNOSIS — R911 Solitary pulmonary nodule: Secondary | ICD-10-CM | POA: Insufficient documentation

## 2020-10-12 DIAGNOSIS — Z95828 Presence of other vascular implants and grafts: Secondary | ICD-10-CM

## 2020-10-12 DIAGNOSIS — Z79899 Other long term (current) drug therapy: Secondary | ICD-10-CM | POA: Diagnosis not present

## 2020-10-12 LAB — CMP (CANCER CENTER ONLY)
ALT: 25 U/L (ref 0–44)
AST: 22 U/L (ref 15–41)
Albumin: 3.5 g/dL (ref 3.5–5.0)
Alkaline Phosphatase: 89 U/L (ref 38–126)
Anion gap: 9 (ref 5–15)
BUN: 28 mg/dL — ABNORMAL HIGH (ref 8–23)
CO2: 27 mmol/L (ref 22–32)
Calcium: 11.1 mg/dL — ABNORMAL HIGH (ref 8.9–10.3)
Chloride: 101 mmol/L (ref 98–111)
Creatinine: 0.86 mg/dL (ref 0.44–1.00)
GFR, Estimated: >60 mL/min (ref >60)
Glucose, Bld: 101 mg/dL — ABNORMAL HIGH (ref 70–99)
Potassium: 4.1 mmol/L (ref 3.5–5.1)
Sodium: 137 mmol/L (ref 135–145)
Total Bilirubin: 0.5 mg/dL (ref 0.3–1.2)
Total Protein: 8.5 g/dL — ABNORMAL HIGH (ref 6.5–8.1)

## 2020-10-12 MED ORDER — HEPARIN SOD (PORK) LOCK FLUSH 100 UNIT/ML IV SOLN
500.0000 [IU] | Freq: Once | INTRAVENOUS | Status: DC | PRN
  Filled 2020-10-12: qty 5

## 2020-10-12 MED ORDER — PEMBROLIZUMAB CHEMO INJECTION 100 MG/4ML
200.0000 mg | Freq: Once | INTRAVENOUS | Status: AC
  Administered 2020-10-12: 200 mg via INTRAVENOUS
  Filled 2020-10-12: qty 8

## 2020-10-12 MED ORDER — SODIUM CHLORIDE 0.9 % IV SOLN
Freq: Once | INTRAVENOUS | Status: AC
  Filled 2020-10-12: qty 250

## 2020-10-12 MED ORDER — SODIUM CHLORIDE 0.9% FLUSH
10.0000 mL | INTRAVENOUS | Status: DC | PRN
  Filled 2020-10-12: qty 10

## 2020-10-12 NOTE — Progress Notes (Signed)
   Covid-19 Vaccination Clinic  Name:  Terri [name]    MRN: [id number] DOB: 1951/09/15  10/12/2020  Terri [name] was observed post Covid-19 immunization for 15 minutes without incident. She was provided with Vaccine Information Sheet and instruction to access the V-Safe system.   Terri [name] was instructed to call 911 with any severe reactions post vaccine: [name] Difficulty breathing  . Swelling of face and throat  . A fast heartbeat  . A bad rash all over body  . Dizziness and weakness

## 2020-10-12 NOTE — Patient Instructions (Signed)
[facility] Discharge Instructions for Patients Receiving Chemotherapy  Today you received the following chemotherapy agents Beryle Flock  To help prevent nausea and vomiting after your treatment, we encourage you to take your nausea medication as directed   If you develop nausea and vomiting that is not controlled by your nausea medication, call the clinic.   BELOW ARE SYMPTOMS THAT SHOULD BE REPORTED IMMEDIATELY:  *FEVER GREATER THAN 100.5 F  *CHILLS WITH OR WITHOUT FEVER  NAUSEA AND VOMITING THAT IS NOT CONTROLLED WITH YOUR NAUSEA MEDICATION  *UNUSUAL SHORTNESS OF BREATH  *UNUSUAL BRUISING OR BLEEDING  TENDERNESS IN MOUTH AND THROAT WITH OR WITHOUT PRESENCE OF ULCERS  *URINARY PROBLEMS  *BOWEL PROBLEMS  UNUSUAL RASH Items with * indicate a potential emergency and should be followed up as soon as possible.  Feel free to call the clinic should you have any questions or concerns. The clinic phone number is [phone].  Please show the [place] at check-in to the Emergency Department and triage nurse.

## 2020-10-12 NOTE — Progress Notes (Addendum)
[facility] OFFICE PROGRESS NOTE   Diagnosis: Head and neck cancer  INTERVAL HISTORY:   Terri [name] returns as scheduled.  She completed cycle 1 Pembrolizumab 09/19/2020.  She denies nausea/vomiting.  No diarrhea.  No rash.  Overall pain level has decreased.  Pain is mainly located at the right jaw/neck.  She takes oxycodone as needed.  She has not tried hydromorphone which was recently prescribed.  She is tolerating a soft diet.  She denies fever, cough, shortness of breath.  Objective:  Vital signs in last 24 hours:  Blood pressure 129/80, pulse 100, temperature 98.1 F (36.7 C), temperature source Tympanic, resp. rate 20, height '5\' 5"'  (1.651 m), weight 135 lb 9.6 oz (61.5 kg), SpO2 100 %.    HEENT: Fungating mass at the right submandibular area; radiation changes right neck. Resp: Lungs clear. Cardio: Regular rate and rhythm. GI: Abdomen soft and nontender.  No hepatosplenomegaly. Vascular: No leg edema. Port-A-Cath without erythema.  Lab Results:  Lab Results  Component Value Date   WBC 5.8 09/18/2020   HGB 12.8 09/18/2020   HCT 39.6 09/18/2020   MCV 93.2 09/18/2020   PLT 222 09/18/2020   NEUTROABS 4.8 09/18/2020    Imaging:  No results found.  Medications: I have reviewed the patient's current medications.  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 Nicolette Bang -Pathology showed 4.2 cm tumor with margins negative, 17 mm depth of invasion, moderately to poorly differentiated, positive neural invasion, 2/17 lymph nodes positive, ECE positive, PT 4 APN 3B.  PD-L1 CPS greater than 10 -IMRT with weekly cisplatin from 06/27/2019 through 07/25/2019. -PET scan on 11/04/2019 showed hypermetabolic level 1B lymph node in the right submental region measuring 5 mm. Left upper lobe pulmonary nodule measuring 8 mm. -PET scan on  02/13/2020 with ill-defined 9 mm short axis level 1B node with SUV 8.6, previously 5.6 SUV. 2.1 cm short axis superior mediastinal/prevascular node previously 8 mm SUV 13.2. -Bronchoscopy on 03/06/2020 with biopsy of the left lung lesion consistent with squamous cell carcinoma. Right lung lesion shows atypical cells. -XRT to the right and left lung lesions completed on 04/02/2020 -She met with Dr. Lorrene Reid at [facility] who offered surgical resection. Patient was reluctant to consider it given morbidity associated with it. -PET scan on 08/27/2020 showed 19 mm short axis right submandibular node, maximum SUV 12.0, previously 11.5. PET scan on 02/13/2020 showed SUV of 7.32, with lymph node measuring 9 mm in short axis. Mild hypermetabolism along the right lateral chest wall, SUV 4.2. 7 mm short axis anterior mediastinal/prevascular lymph node, maximum SUV 4.1. -CT neck 09/18/2020-increase in right submandibular metastatic lymph node extending into the right sublingual region, postsurgical changes from right neck dissection -Cycle 1 pembrolizumab 09/19/2020 -Cycle 2 Pembrolizumab 10/12/2020  2.  Pain secondary to the right submandibular mass  Disposition: Terri [name] appears stable.  She has completed 1 cycle of Pembrolizumab.  Plan to proceed with cycle 2 today as scheduled pending results from chemistry panel.  She would like to receive the Covid vaccine booster today.  She will return for lab, follow-up, cycle 3 Pembrolizumab in 3 weeks.  She will contact the office in the interim with any problems.  Patient seen with Dr. Benay Spice.    Ned Card ANP/GNP-BC   10/12/2020  12:01 PM  This was a shared visit with Ned Card.  Terri [name] was interviewed and examined.  She tolerated the first treatment with pembrolizumab well.  The right submandibular mass appears unchanged.  She will continue oxycodone and hydromorphone as needed for pain.  Julieanne Manson, MD

## 2020-10-15 ENCOUNTER — Telehealth: Payer: Self-pay | Admitting: Nurse Practitioner

## 2020-10-15 ENCOUNTER — Telehealth: Payer: Self-pay | Admitting: Oncology

## 2020-10-15 NOTE — Telephone Encounter (Signed)
Scheduled appt per 1/7 sch msg - pt is aware of appt date and time

## 2020-10-15 NOTE — Telephone Encounter (Signed)
Scheduled appointments per 1/7 los. Spoke to patient who is aware of appointments dates and times.

## 2020-10-16 NOTE — Progress Notes (Signed)
Oncology Nurse Navigator Documentation  At the request of Ned Card NP at [facility] I contacted [facility] about adding PD-L1 testing to a 05/13/2019 biopsy completed at [facility] requested that I fax the request to the number provided and receipt of successful fax transmission was received. I will follow up for results and notify Ned Card when received.   Harlow Asa RN, BSN, OCN Head & Neck Oncology Nurse [place] at [facility] Phone # [phone]  Fax # [phone]

## 2020-10-19 ENCOUNTER — Ambulatory Visit
Admission: RE | Admit: 2020-10-19 | Discharge: 2020-10-19 | Disposition: A | Discharge status: Home / Self Care | Payer: BC Managed Care – PPO | Source: Ambulatory Visit | Attending: Radiation Oncology | Admitting: Radiation Oncology

## 2020-10-19 ENCOUNTER — Ambulatory Visit: Payer: BC Managed Care – PPO

## 2020-10-19 ENCOUNTER — Encounter: Payer: Self-pay | Admitting: Radiation Oncology

## 2020-10-19 DIAGNOSIS — C021 Malignant neoplasm of border of tongue: Secondary | ICD-10-CM

## 2020-10-19 DIAGNOSIS — C3411 Malignant neoplasm of upper lobe, right bronchus or lung: Secondary | ICD-10-CM

## 2020-10-19 DIAGNOSIS — C023 Malignant neoplasm of anterior two-thirds of tongue, part unspecified: Secondary | ICD-10-CM

## 2020-10-19 NOTE — Progress Notes (Signed)
Radiation Oncology         [phone] ________________________________  Name: Terri [name] MRN: [id number]  Date: 10/19/2020  DOB: Nov 22, 1950  Follow-Up Visit Note by telephone as patient opted for telemedicine was unable to access MyChart video during pandemic precautions   CC: [name], DO  Minette Brine, Amber Lea, DO  Diagnosis and Prior Radiotherapy:     No diagnosis found.  06/27/2019 through 08/05/2019  Site Technique Total Dose (Gy) Dose per Fx (Gy) Completed Fx Beam Energies  Head & neck: HN_tongue IMRT 60/60 2 30/30 6X    Radiation Treatment Dates: 03/21/2020 through 04/03/2020 Site Technique Total Dose (Gy) Dose per Fx (Gy) Completed Fx Beam Energies  Lung, Right: Lung_Rt IMRT 50/50 10 5/5 6XFFF  Lung, Left: Lung_Lt IMRT 50/50 5 10/10 6XFFF    CHIEF COMPLAINT: The tumor is causing pain but better  Narrative:    I spoke with the patient today by phone as she opted for telemedicine visit.  I have reviewed her CT and pet imaging.  This is most noticeable for significant tumor progression in the right submandibular region; the tumor is now fungating through skin; she is status post 2 cycles of pembrolizumab under the care of Dr. Benay Spice.  She started this 4 weeks ago and she reports some improvement in pain since then.   She does report that there is some odor coming from the tumor at this time.  She denies any fevers.  She received her booster shot against COVID last week.  She is concerned that the tumor will start to affect her swallowing and her ability to speak..  ALLERGIES:  has No Known Allergies.  Meds: Current Outpatient Medications  Medication Sig Dispense Refill  . acetaminophen (TYLENOL) 650 MG CR tablet Take 1,300 mg by mouth every 8 (eight) hours as needed for pain.    . DENTA 5000 PLUS 1.1 % CREA dental cream Place 1 application onto teeth at bedtime.     . fluconazole (DIFLUCAN) 100 MG tablet Take 1 tablet (100 mg total) by mouth daily. 30 tablet  1  . HYDROmorphone (DILAUDID) 2 MG tablet Take 1-2 tablets (2-4 mg total) by mouth every 4 (four) hours as needed for severe pain. 60 tablet 0  . OVER THE COUNTER MEDICATION Take 1 tablet by mouth daily. VS-C supplement    . oxyCODONE (OXY IR/ROXICODONE) 5 MG immediate release tablet Take 1 tablet (5 mg total) by mouth every 6 (six) hours as needed. (Patient not taking: Reported on 10/12/2020) 30 tablet 0  . oxycodone (OXY-IR) 5 MG capsule Take 1 capsule (5 mg total) by mouth every 6 (six) hours as needed. 120 capsule 0  . PEMBROLIZUMAB IV Inject into the vein every 21 ( twenty-one) days.    . Probiotic CAPS Take 1 capsule by mouth daily.     No current facility-administered medications for this encounter.   Facility-Administered Medications Ordered in Other Encounters  Medication Dose Route Frequency Provider Last Rate Last Admin  . heparin lock flush 100 unit/mL  500 Units Intracatheter Once PRN Derek Jack, MD      . sodium chloride 0.9 % 1,000 mL with potassium chloride 20 mEq, magnesium sulfate 2 g infusion   Intravenous Once Derek Jack, MD      . sodium chloride flush (NS) 0.9 % injection 10 mL  10 mL Intracatheter Once PRN Derek Jack, MD        Physical Findings: The patient is in no acute distress. Patient is alert  and oriented. Wt Readings from Last 3 Encounters:  10/12/20 135 lb 9.6 oz (61.5 kg)  09/19/20 137 lb 9.6 oz (62.4 kg)  08/29/20 135 lb 12.8 oz (61.6 kg)    vitals were not taken for this visit. .  General: Alert and oriented, in no acute distress  Psychiatric: Judgment and insight are intact.   Lab Findings: Lab Results  Component Value Date   WBC 5.8 09/18/2020   HGB 12.8 09/18/2020   HCT 39.6 09/18/2020   MCV 93.2 09/18/2020   PLT 222 09/18/2020    Lab Results  Component Value Date   TSH 4.156 09/18/2020    Radiographic Findings: As above.  I personally reviewed her previous imaging and her current  imaging.   Impression/Plan: Stage IV tongue cancer  Head and neck cancer status: Progressive disease in a previously irradiated area (right submandibular region)  I reached out to Dr. Benay Spice and Ned Card to gauge how optimistic they are regarding her prospects for response to immunotherapy.  PD-L1 scores showed partial /limited positivity. I do believe that palliative reirradiation could help her symptoms where her submandibular tumor is growing. If we do palliative radiation I can use a quad shot method which is treatments twice a day 2 days in a row.  We do not need to do this right away if they think there is a decent chance that immunotherapy alone will cause the disease to regress.  I asked them to please let me know if they see a hint of progression, in which case we will get her in for treatment planning.  Alternatively we can bring her in sooner rather than later if they think that her prospects from immunotherapy are limited.  She did mention some odor starting to develop at the submandibular mass.  I asked the medical oncology team to check this when she comes in next Friday to see if antibiotics are warranted.  She denies fevers.  I also asked the team to contact [facility]  whenever she has reimaging planned.  Anderson Malta will add her to tumor board so that the team will be able to discuss her surveillance imaging.  I will see her back as needed, pending response from medical oncology.  In the meantime she will continue immunotherapy infusions for her progressive disease.  This encounter was provided by telemedicine platform by telephone as patient was unable to access MyChart video during pandemic precautions The patient has given verbal consent for this type of encounter and has been advised to only accept a meeting of this type in a secure network environment. The time spent during this encounter on date of service, in total, was 48mnutes. The attendants for this meeting  include SEppie Gibson and LGearldine Shown  During the encounter, [facility]pp[facility]was [facility].  LBeryle Bagsbywas located at home.   _____________________________________   SEppie Gibson MD

## 2020-10-26 ENCOUNTER — Inpatient Hospital Stay: Payer: BC Managed Care – PPO

## 2020-10-31 ENCOUNTER — Telehealth: Payer: Self-pay

## 2020-10-31 NOTE — Telephone Encounter (Signed)
Pt called in and stated she would like to discuss the tumor on her neck and managing the drainage at her appointment with Ned Card NP on Friday. She denied having any fever or pain but stated that the tumor was draining and had a foul odor. Dr. Benay Spice has prior aware of the tumor.

## 2020-11-01 ENCOUNTER — Other Ambulatory Visit: Payer: Self-pay | Admitting: Oncology

## 2020-11-02 ENCOUNTER — Inpatient Hospital Stay: Payer: BC Managed Care – PPO | Admitting: Nurse Practitioner

## 2020-11-02 ENCOUNTER — Inpatient Hospital Stay: Payer: BC Managed Care – PPO

## 2020-11-02 ENCOUNTER — Other Ambulatory Visit: Payer: Self-pay

## 2020-11-02 ENCOUNTER — Encounter: Payer: Self-pay | Admitting: Nurse Practitioner

## 2020-11-02 VITALS — BP 144/83 | HR 102 | Temp 98.1°F | Resp 20 | Ht 65.0 in | Wt 131.1 lb

## 2020-11-02 VITALS — HR 97

## 2020-11-02 DIAGNOSIS — C023 Malignant neoplasm of anterior two-thirds of tongue, part unspecified: Secondary | ICD-10-CM

## 2020-11-02 DIAGNOSIS — C021 Malignant neoplasm of border of tongue: Secondary | ICD-10-CM | POA: Diagnosis not present

## 2020-11-02 DIAGNOSIS — R6884 Jaw pain: Secondary | ICD-10-CM

## 2020-11-02 DIAGNOSIS — Z95828 Presence of other vascular implants and grafts: Secondary | ICD-10-CM

## 2020-11-02 LAB — CMP (CANCER CENTER ONLY)
ALT: 17 U/L (ref 0–44)
AST: 18 U/L (ref 15–41)
Albumin: 3.4 g/dL — ABNORMAL LOW (ref 3.5–5.0)
Alkaline Phosphatase: 80 U/L (ref 38–126)
Anion gap: 8 (ref 5–15)
BUN: 25 mg/dL — ABNORMAL HIGH (ref 8–23)
CO2: 28 mmol/L (ref 22–32)
Calcium: 11 mg/dL — ABNORMAL HIGH (ref 8.9–10.3)
Chloride: 102 mmol/L (ref 98–111)
Creatinine: 0.79 mg/dL (ref 0.44–1.00)
GFR, Estimated: >60 mL/min (ref >60)
Glucose, Bld: 84 mg/dL (ref 70–99)
Potassium: 3.9 mmol/L (ref 3.5–5.1)
Sodium: 138 mmol/L (ref 135–145)
Total Bilirubin: 0.4 mg/dL (ref 0.3–1.2)
Total Protein: 7.8 g/dL (ref 6.5–8.1)

## 2020-11-02 LAB — CBC WITH DIFFERENTIAL (CANCER CENTER ONLY)
Abs Immature Granulocytes: 0.01 10*3/uL (ref 0.00–0.07)
Basophils Absolute: 0 10*3/uL (ref 0.0–0.1)
Basophils Relative: 0 %
Eosinophils Absolute: 0.2 10*3/uL (ref 0.0–0.5)
Eosinophils Relative: 4 %
HCT: 39.3 % (ref 36.0–46.0)
Hemoglobin: 12.6 g/dL (ref 12.0–15.0)
Immature Granulocytes: 0 %
Lymphocytes Relative: 8 %
Lymphs Abs: 0.4 10*3/uL — ABNORMAL LOW (ref 0.7–4.0)
MCH: 28.9 pg (ref 26.0–34.0)
MCHC: 32.1 g/dL (ref 30.0–36.0)
MCV: 90.1 fL (ref 80.0–100.0)
Monocytes Absolute: 0.6 10*3/uL (ref 0.1–1.0)
Monocytes Relative: 12 %
Neutro Abs: 3.6 10*3/uL (ref 1.7–7.7)
Neutrophils Relative %: 76 %
Platelet Count: 216 10*3/uL (ref 150–400)
RBC: 4.36 MIL/uL (ref 3.87–5.11)
RDW: 12 % (ref 11.5–15.5)
WBC Count: 4.7 10*3/uL (ref 4.0–10.5)
nRBC: 0 % (ref 0.0–0.2)

## 2020-11-02 MED ORDER — OXYCODONE HCL 5 MG PO TABS: 5.0000 mg | ORAL_TABLET | ORAL | 0 refills | Status: DC | PRN

## 2020-11-02 MED ORDER — HEPARIN SOD (PORK) LOCK FLUSH 100 UNIT/ML IV SOLN
500.0000 [IU] | Freq: Once | INTRAVENOUS | Status: AC | PRN
  Administered 2020-11-02: 500 [IU]
  Filled 2020-11-02: qty 5

## 2020-11-02 MED ORDER — SODIUM CHLORIDE 0.9% FLUSH
10.0000 mL | INTRAVENOUS | Status: DC | PRN
Start: 2020-11-02 — End: 2020-11-02
  Administered 2020-11-02: 10 mL
  Filled 2020-11-02: qty 10

## 2020-11-02 MED ORDER — SODIUM CHLORIDE 0.9% FLUSH
10.0000 mL | INTRAVENOUS | Status: DC | PRN
  Administered 2020-11-02: 10 mL via INTRAVENOUS
  Filled 2020-11-02: qty 10

## 2020-11-02 MED ORDER — SODIUM CHLORIDE 0.9 % IV SOLN
Freq: Once | INTRAVENOUS | Status: AC
  Filled 2020-11-02: qty 250

## 2020-11-02 MED ORDER — METRONIDAZOLE 250 MG PO TABS: 250.0000 mg | ORAL_TABLET | Freq: Two times a day (BID) | ORAL | 0 refills | Status: DC

## 2020-11-02 MED ORDER — SODIUM CHLORIDE 0.9 % IV SOLN
200.0000 mg | Freq: Once | INTRAVENOUS | Status: AC
  Administered 2020-11-02: 200 mg via INTRAVENOUS
  Filled 2020-11-02: qty 8

## 2020-11-02 NOTE — Patient Instructions (Signed)

## 2020-11-02 NOTE — Progress Notes (Signed)
[place] OFFICE PROGRESS NOTE   Diagnosis: Head and neck cancer  INTERVAL HISTORY:   Ms. Boesen returns as scheduled.  She completed cycle 2 Pembrolizumab 10/12/2020.  No rash or diarrhea.  She denies nausea/vomiting.  She is tolerating a soft diet.  She thinks the tumor mass is softer.  Some improvement in pain.  She estimates taking pain medicine 2-3 times a day.  She feels oxycodone works better than hydrocodone.  She has noticed an odor related to the mass.  She continues to have drainage.  No fever, cough, shortness of breath.  Objective:  Vital signs in last 24 hours:  Blood pressure (!) 144/83, pulse (!) 102, temperature 98.1 F (36.7 C), temperature source Tympanic, resp. rate 20, height 5' 5" (1.651 m), weight 131 lb 1.6 oz (59.5 kg), SpO2 100 %.    HEENT: Fungating mass at the right submandibular area, drainage on bandage. Resp: Lungs clear bilaterally. Cardio: Regular rate and rhythm. GI: No hepatosplenomegaly. Vascular: No leg edema. Neuro: Alert and oriented. Skin: No rash. Port-A-Cath without erythema.   Lab Results:  Lab Results  Component Value Date   WBC 4.7 11/02/2020   HGB 12.6 11/02/2020   HCT 39.3 11/02/2020   MCV 90.1 11/02/2020   PLT 216 11/02/2020   NEUTROABS 3.6 11/02/2020    Imaging:  No results found.  Medications: I have reviewed the patient's current medications.  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 Nicolette Bang -Pathology showed 4.2 cm tumor with margins negative, 17 mm depth of invasion, moderately to poorly differentiated, positive neural invasion, 2/17 lymph nodes positive, ECE positive, PT 4 APN 3B.  PD-L1 CPS greater than 10 -IMRT with weekly cisplatin from 06/27/2019 through 07/25/2019. -PET scan on 11/04/2019 showed hypermetabolic level 1B lymph node in the right submental  region measuring 5 mm. Left upper lobe pulmonary nodule measuring 8 mm. -PET scan on 02/13/2020 with ill-defined 9 mm short axis level 1B node with SUV 8.6, previously 5.6 SUV. 2.1 cm short axis superior mediastinal/prevascular node previously 8 mm SUV 13.2. -Bronchoscopy on 03/06/2020 with biopsy of the left lung lesion consistent with squamous cell carcinoma. Right lung lesion shows atypical cells. -XRT to the right and left lung lesions completed on 04/02/2020 -She met with Dr. Lorrene Reid at [facility] who offered surgical resection. Patient was reluctant to consider it given morbidity associated with it. -PET scan on 08/27/2020 showed 19 mm short axis right submandibular node, maximum SUV 12.0, previously 11.5. PET scan on 02/13/2020 showed SUV of 7.32, with lymph node measuring 9 mm in short axis. Mild hypermetabolism along the right lateral chest wall, SUV 4.2. 7 mm short axis anterior mediastinal/prevascular lymph node, maximum SUV 4.1. -CT neck 09/18/2020-increase in right submandibular metastatic lymph node extending into the right sublingual region, postsurgical changes from right neck dissection -Cycle 1 pembrolizumab 09/19/2020 -Cycle 2 Pembrolizumab 10/12/2020 -Cycle 3 Pembrolizumab 11/02/2020  2.Pain secondary to the right submandibular mass-improved 11/02/2020.   Disposition: Ms. Rylee appears stable.  She has completed 2 cycles of Pembrolizumab.  She has noticed improvement in pain and feels that the mass is "softer". Plan to proceed with cycle 3 today as scheduled.     New oxycodone prescription sent to pharmacy.  For the odor we discussed a trial of Flagyl.  She does not want to apply anything topically.  Prescription sent to her pharmacy for Flagyl 250 mg  by mouth twice daily.  She will return for lab, follow-up, cycle 4 Pembrolizumab in 3 weeks.  She will contact the office in the interim with any problems.    Ned Card ANP/GNP-BC   11/02/2020  2:19  PM

## 2020-11-02 NOTE — Patient Instructions (Signed)
[facility] Discharge Instructions for Patients Receiving Chemotherapy  Today you received the following chemotherapy agents Beryle Flock  To help prevent nausea and vomiting after your treatment, we encourage you to take your nausea medication as directed   If you develop nausea and vomiting that is not controlled by your nausea medication, call the clinic.   BELOW ARE SYMPTOMS THAT SHOULD BE REPORTED IMMEDIATELY:  *FEVER GREATER THAN 100.5 F  *CHILLS WITH OR WITHOUT FEVER  NAUSEA AND VOMITING THAT IS NOT CONTROLLED WITH YOUR NAUSEA MEDICATION  *UNUSUAL SHORTNESS OF BREATH  *UNUSUAL BRUISING OR BLEEDING  TENDERNESS IN MOUTH AND THROAT WITH OR WITHOUT PRESENCE OF ULCERS  *URINARY PROBLEMS  *BOWEL PROBLEMS  UNUSUAL RASH Items with * indicate a potential emergency and should be followed up as soon as possible.  Feel free to call the clinic should you have any questions or concerns. The clinic phone number is [phone].  Please show the [place] at check-in to the Emergency Department and triage nurse.

## 2020-11-05 ENCOUNTER — Telehealth: Payer: Self-pay | Admitting: Nurse Practitioner

## 2020-11-05 LAB — TSH: TSH: 1.946 u[IU]/mL (ref 0.308–3.960)

## 2020-11-05 NOTE — Telephone Encounter (Signed)
Scheduled appointments per 1/28 los. Spoke to patient who is aware of appointments date and times.

## 2020-11-16 ENCOUNTER — Telehealth (HOSPITAL_COMMUNITY): Payer: Self-pay

## 2020-11-16 NOTE — Telephone Encounter (Signed)
Nutrition Follow-up:  Patient identified on Malnutrition Screening report for weight loss  Patient with stage IV squamous cell carcinoma of right lateral tongue.  Right parital glossectomy and neck dissection on 05/13/2019. Radiation and cisplatin 06/27/2019 throught 07/25/2019.  03/06/2020 left lung lesion and radiation completed on 04/02/2020.  PET on 08/27/2020 showing right submandibular metastatic lymph node extending into the right sublingual region.  Patient has started [place].  Spoke with patient via phone. RD familiar with patient from previous treatment. RD last spoke with patient on 12/02/2019.   Patient reports that she is doing the best she can.  Drinks 5-7 boost plus shakes per day and eats brothy soups.  Can't open mouth very wide and has been having issues with pain which makes it difficult for her to eat.  Denies any treatment side effects, hoping to avoid radiation    Medications: reviewed  Labs: reviewed  Anthropometrics:   Weight 131 lb on 1/28 decreased from 153 lb on 11/08/2019  14% weight loss in the last year   Estimated Energy Needs  Kcals: 1800-2100 Protein: 90-105 g Fluid: > 1.8 L  NUTRITION DIAGNOSIS: Inadequate oral intake continues    MALNUTRITION DIAGNOSIS: Likely with continued weight loss and limited ability to eat   INTERVENTION:  Continue boost plus 5-7 per day (provides 1800-2450 calories and 70-98 g protein) Will mail handout on making savory smoothies for added calories and protein Patient has contact information    MONITORING, EVALUATION, GOAL: weight trends, intake   NEXT VISIT: to be determined  Necie Wilcoxson B. Zenia Resides, [place], [place] Registered Dietitian [phone] (mobile)

## 2020-11-18 ENCOUNTER — Other Ambulatory Visit: Payer: Self-pay | Admitting: Oncology

## 2020-11-23 ENCOUNTER — Other Ambulatory Visit: Payer: Self-pay

## 2020-11-23 ENCOUNTER — Inpatient Hospital Stay: Payer: BC Managed Care – PPO | Attending: Nurse Practitioner

## 2020-11-23 ENCOUNTER — Other Ambulatory Visit: Payer: Self-pay | Admitting: *Deleted

## 2020-11-23 ENCOUNTER — Telehealth: Payer: Self-pay | Admitting: Oncology

## 2020-11-23 ENCOUNTER — Inpatient Hospital Stay (HOSPITAL_BASED_OUTPATIENT_CLINIC_OR_DEPARTMENT_OTHER): Payer: BC Managed Care – PPO | Admitting: Oncology

## 2020-11-23 ENCOUNTER — Inpatient Hospital Stay: Payer: BC Managed Care – PPO

## 2020-11-23 VITALS — BP 136/77 | HR 93 | Temp 97.1°F | Resp 18 | Ht 65.0 in | Wt 125.7 lb

## 2020-11-23 VITALS — BP 125/63 | HR 72 | Resp 18

## 2020-11-23 DIAGNOSIS — C023 Malignant neoplasm of anterior two-thirds of tongue, part unspecified: Secondary | ICD-10-CM

## 2020-11-23 DIAGNOSIS — Z923 Personal history of irradiation: Secondary | ICD-10-CM | POA: Diagnosis not present

## 2020-11-23 DIAGNOSIS — C021 Malignant neoplasm of border of tongue: Secondary | ICD-10-CM

## 2020-11-23 DIAGNOSIS — C7801 Secondary malignant neoplasm of right lung: Secondary | ICD-10-CM | POA: Insufficient documentation

## 2020-11-23 DIAGNOSIS — Z95828 Presence of other vascular implants and grafts: Secondary | ICD-10-CM

## 2020-11-23 DIAGNOSIS — E042 Nontoxic multinodular goiter: Secondary | ICD-10-CM | POA: Insufficient documentation

## 2020-11-23 DIAGNOSIS — Z9221 Personal history of antineoplastic chemotherapy: Secondary | ICD-10-CM | POA: Diagnosis not present

## 2020-11-23 DIAGNOSIS — R6884 Jaw pain: Secondary | ICD-10-CM

## 2020-11-23 DIAGNOSIS — C7802 Secondary malignant neoplasm of left lung: Secondary | ICD-10-CM | POA: Insufficient documentation

## 2020-11-23 LAB — CBC WITH DIFFERENTIAL (CANCER CENTER ONLY)
Abs Immature Granulocytes: 0.01 10*3/uL (ref 0.00–0.07)
Basophils Absolute: 0 10*3/uL (ref 0.0–0.1)
Basophils Relative: 1 %
Eosinophils Absolute: 0.3 10*3/uL (ref 0.0–0.5)
Eosinophils Relative: 4 %
HCT: 38.2 % (ref 36.0–46.0)
Hemoglobin: 12.5 g/dL (ref 12.0–15.0)
Immature Granulocytes: 0 %
Lymphocytes Relative: 6 %
Lymphs Abs: 0.4 10*3/uL — ABNORMAL LOW (ref 0.7–4.0)
MCH: 29.1 pg (ref 26.0–34.0)
MCHC: 32.7 g/dL (ref 30.0–36.0)
MCV: 89 fL (ref 80.0–100.0)
Monocytes Absolute: 0.6 10*3/uL (ref 0.1–1.0)
Monocytes Relative: 9 %
Neutro Abs: 5.3 10*3/uL (ref 1.7–7.7)
Neutrophils Relative %: 80 %
Platelet Count: 229 10*3/uL (ref 150–400)
RBC: 4.29 MIL/uL (ref 3.87–5.11)
RDW: 12.2 % (ref 11.5–15.5)
WBC Count: 6.5 10*3/uL (ref 4.0–10.5)
nRBC: 0 % (ref 0.0–0.2)

## 2020-11-23 LAB — CMP (CANCER CENTER ONLY)
ALT: 21 U/L (ref 0–44)
AST: 24 U/L (ref 15–41)
Albumin: 3.5 g/dL (ref 3.5–5.0)
Alkaline Phosphatase: 76 U/L (ref 38–126)
Anion gap: 7 (ref 5–15)
BUN: 26 mg/dL — ABNORMAL HIGH (ref 8–23)
CO2: 26 mmol/L (ref 22–32)
Calcium: 12.1 mg/dL — ABNORMAL HIGH (ref 8.9–10.3)
Chloride: 101 mmol/L (ref 98–111)
Creatinine: 0.88 mg/dL (ref 0.44–1.00)
GFR, Estimated: >60 mL/min (ref >60)
Glucose, Bld: 108 mg/dL — ABNORMAL HIGH (ref 70–99)
Potassium: 4.1 mmol/L (ref 3.5–5.1)
Sodium: 134 mmol/L — ABNORMAL LOW (ref 135–145)
Total Bilirubin: 0.4 mg/dL (ref 0.3–1.2)
Total Protein: 7.7 g/dL (ref 6.5–8.1)

## 2020-11-23 LAB — TSH: TSH: 3.489 u[IU]/mL (ref 0.308–3.960)

## 2020-11-23 MED ORDER — ZOLEDRONIC ACID 4 MG/5ML IV CONC
3.3000 mg | Freq: Once | INTRAVENOUS | Status: AC
  Administered 2020-11-23: 3.3 mg via INTRAVENOUS
  Filled 2020-11-23: qty 4.13

## 2020-11-23 MED ORDER — ZOLEDRONIC ACID 4 MG/100ML IV SOLN
INTRAVENOUS | Status: AC
  Filled 2020-11-23: qty 100

## 2020-11-23 MED ORDER — SODIUM CHLORIDE 0.9 % IV SOLN
Freq: Once | INTRAVENOUS | Status: AC
  Filled 2020-11-23: qty 250

## 2020-11-23 MED ORDER — MORPHINE SULFATE ER 15 MG PO TBCR
15.0000 mg | EXTENDED_RELEASE_TABLET | Freq: Two times a day (BID) | ORAL | 0 refills | Status: DC
Start: 2020-11-23 — End: 2020-12-14

## 2020-11-23 MED ORDER — SODIUM CHLORIDE 0.9% FLUSH
10.0000 mL | Freq: Once | INTRAVENOUS | Status: AC | PRN
  Administered 2020-11-23: 10 mL
  Filled 2020-11-23: qty 10

## 2020-11-23 MED ORDER — CLOTRIMAZOLE 10 MG MT TROC: 10.0000 mg | Freq: Four times a day (QID) | OROMUCOSAL | 1 refills | Status: AC

## 2020-11-23 MED ORDER — ACETAMINOPHEN 325 MG PO TABS
325.0000 mg | ORAL_TABLET | Freq: Once | ORAL | Status: AC
  Administered 2020-11-23: 325 mg via ORAL

## 2020-11-23 MED ORDER — LIDOCAINE-PRILOCAINE 2.5-2.5 % EX CREA: 1.0000 "application " | TOPICAL_CREAM | CUTANEOUS | 1 refills | Status: AC

## 2020-11-23 MED ORDER — OXYCODONE HCL 5 MG PO TABS
5.0000 mg | ORAL_TABLET | Freq: Once | ORAL | Status: AC
  Administered 2020-11-23: 5 mg via ORAL

## 2020-11-23 MED ORDER — SODIUM CHLORIDE 0.9 % IV SOLN
INTRAVENOUS | Status: AC
  Filled 2020-11-23 (×2): qty 250

## 2020-11-23 MED ORDER — OXYCODONE HCL 5 MG PO TABS
ORAL_TABLET | ORAL | Status: AC
  Filled 2020-11-23: qty 1

## 2020-11-23 MED ORDER — HEPARIN SOD (PORK) LOCK FLUSH 100 UNIT/ML IV SOLN
500.0000 [IU] | Freq: Once | INTRAVENOUS | Status: AC | PRN
  Administered 2020-11-23: 500 [IU]
  Filled 2020-11-23: qty 5

## 2020-11-23 MED ORDER — ACETAMINOPHEN 325 MG PO TABS
ORAL_TABLET | ORAL | Status: AC
  Filled 2020-11-23: qty 1

## 2020-11-23 MED ORDER — SODIUM CHLORIDE 0.9% FLUSH
10.0000 mL | Freq: Once | INTRAVENOUS | Status: AC
  Administered 2020-11-23: 10 mL
  Filled 2020-11-23: qty 10

## 2020-11-23 NOTE — Patient Instructions (Addendum)
Zoledronic Acid Injection (Hypercalcemia, Oncology) What is this medicine? ZOLEDRONIC ACID (ZOE le dron ik AS id) slows calcium loss from bones. It high calcium levels in the blood from some kinds of cancer. It may be used in other people at risk for bone loss. This medicine may be used for other purposes; ask your health care provider or pharmacist if you have questions. COMMON BRAND NAME(S): Zometa What should I tell my health care provider before I take this medicine? They need to know if you have any of these conditions:  cancer  dehydration  dental disease  kidney disease  liver disease  low levels of calcium in the blood  lung or breathing disease (asthma)  receiving steroids like dexamethasone or prednisone  an unusual or allergic reaction to zoledronic acid, other medicines, foods, dyes, or preservatives  pregnant or trying to get pregnant  breast-feeding How should I use this medicine? This drug is injected into a vein. It is given by a health care provider in a hospital or clinic setting. Talk to your health care provider about the use of this drug in children. Special care may be needed. Overdosage: If you think you have taken too much of this medicine contact a poison control center or emergency room at once. NOTE: This medicine is only for you. Do not share this medicine with others. What if I miss a dose? Keep appointments for follow-up doses. It is important not to miss your dose. Call your health care provider if you are unable to keep an appointment. What may interact with this medicine?  certain antibiotics given by injection  NSAIDs, medicines for pain and inflammation, like ibuprofen or naproxen  some diuretics like bumetanide, furosemide  teriparatide  thalidomide This list may not describe all possible interactions. Give your health care provider a list of all the medicines, herbs, non-prescription drugs, or dietary supplements you use. Also tell  them if you smoke, drink alcohol, or use illegal drugs. Some items may interact with your medicine. What should I watch for while using this medicine? Visit your health care provider for regular checks on your progress. It may be some time before you see the benefit from this drug. Some people who take this drug have severe bone, joint, or muscle pain. This drug may also increase your risk for jaw problems or a broken thigh bone. Tell your health care provider right away if you have severe pain in your jaw, bones, joints, or muscles. Tell you health care provider if you have any pain that does not go away or that gets worse. Tell your dentist and dental surgeon that you are taking this drug. You should not have major dental surgery while on this drug. See your dentist to have a dental exam and fix any dental problems before starting this drug. Take good care of your teeth while on this drug. Make sure you see your dentist for regular follow-up appointments. You should make sure you get enough calcium and vitamin D while you are taking this drug. Discuss the foods you eat and the vitamins you take with your health care provider. Check with your health care provider if you have severe diarrhea, nausea, and vomiting, or if you sweat a lot. The loss of too much body fluid may make it dangerous for you to take this drug. You may need blood work done while you are taking this drug. Do not become pregnant while taking this drug. Women should inform their health care provider  if they wish to become pregnant or think they might be pregnant. There is potential for serious harm to an unborn child. Talk to your health care provider for more information. What side effects may I notice from receiving this medicine? Side effects that you should report to your doctor or health care provider as soon as possible:  allergic reactions (skin rash, itching or hives; swelling of the face, lips, or tongue)  bone  pain  infection (fever, chills, cough, sore throat, pain or trouble passing urine)  jaw pain, especially after dental work  joint pain  kidney injury (trouble passing urine or change in the amount of urine)  low blood pressure (dizziness; feeling faint or lightheaded, falls; unusually weak or tired)  low calcium levels (fast heartbeat; muscle cramps or pain; pain, tingling, or numbness in the hands or feet; seizures)  low magnesium levels (fast, irregular heartbeat; muscle cramp or pain; muscle weakness; tremors; seizures)  low red blood cell counts (trouble breathing; feeling faint; lightheaded, falls; unusually weak or tired)  muscle pain  redness, blistering, peeling, or loosening of the skin, including inside the mouth  severe diarrhea  swelling of the ankles, feet, hands  trouble breathing Side effects that usually do not require medical attention (report to your doctor or health care provider if they continue or are bothersome):  anxious  constipation  coughing  depressed mood  eye irritation, itching, or pain  fever  general ill feeling or flu-like symptoms  nausea  pain, redness, or irritation at site where injected  trouble sleeping This list may not describe all possible side effects. Call your doctor for medical advice about side effects. You may report side effects to FDA at 1-800-FDA-1088. Where should I keep my medicine? This drug is given in a hospital or clinic. It will not be stored at home. NOTE: This sheet is a summary. It may not cover all possible information. If you have questions about this medicine, talk to your doctor, pharmacist, or health care provider.  2021 Elsevier/Gold Standard (2019-07-07 09:13:00)   Rehydration, Adult Rehydration is the replacement of body fluids, salts, and minerals (electrolytes) that are lost during dehydration. Dehydration is when there is not enough water or other fluids in the body. This happens when you  lose more fluids than you take in. Common causes of dehydration include:  Not drinking enough fluids. This can occur when you are ill or doing activities that require a lot of energy, especially in hot weather.  Conditions that cause loss of water or other fluids, such as diarrhea, vomiting, sweating, or urinating a lot.  Other illnesses, such as fever or infection.  Certain medicines, such as those that remove excess fluid from the body (diuretics). Symptoms of mild or moderate dehydration may include thirst, dry lips and mouth, and dizziness. Symptoms of severe dehydration may include increased heart rate, confusion, fainting, and not urinating. For severe dehydration, you may need to get fluids through an IV at the hospital. For mild or moderate dehydration, you can usually rehydrate at home by drinking certain fluids as told by your health care provider. What are the risks? Generally, rehydration is safe. However, taking in too much fluid (overhydration) can be a problem. This is rare. Overhydration can cause an electrolyte imbalance, kidney failure, or a decrease in salt (sodium) levels in the body. Supplies needed You will need an oral rehydration solution (ORS) if your health care provider tells you to use one. This is a drink to treat  dehydration. It can be found in pharmacies and retail stores. How to rehydrate Fluids Follow instructions from your health care provider for rehydration. The kind of fluid and the amount you should drink depend on your condition. In general, you should choose drinks that you prefer.  If told by your health care provider, drink an ORS. ? Make an ORS by following instructions on the package. ? Start by drinking small amounts, about  cup (120 mL) every 5-10 minutes. ? Slowly increase how much you drink until you have taken the amount recommended by your health care provider.  Drink enough clear fluids to keep your urine pale yellow. If you were told to  drink an ORS, finish it first, then start slowly drinking other clear fluids. Drink fluids such as: ? Water. This includes sparkling water and flavored water. Drinking only water can lead to having too little sodium in your body (hyponatremia). Follow the advice of your health care provider. ? Water from ice chips you suck on. ? Fruit juice with water you add to it (diluted). ? Sports drinks. ? Hot or cold herbal teas. ? Broth-based soups. ? Milk or milk products. Food Follow instructions from your health care provider about what to eat while you rehydrate. Your health care provider may recommend that you slowly begin eating regular foods in small amounts.  Eat foods that contain a healthy balance of electrolytes, such as bananas, oranges, potatoes, tomatoes, and spinach.  Avoid foods that are greasy or contain a lot of sugar. In some cases, you may get nutrition through a feeding tube that is passed through your nose and into your stomach (nasogastric tube, or NG tube). This may be done if you have uncontrolled vomiting or diarrhea.   Beverages to avoid Certain beverages may make dehydration worse. While you rehydrate, avoid drinking alcohol.   How to tell if you are recovering from dehydration You may be recovering from dehydration if:  You are urinating more often than before you started rehydrating.  Your urine is pale yellow.  Your energy level improves.  You vomit less frequently.  You have diarrhea less frequently.  Your appetite improves or returns to normal.  You feel less dizzy or less light-headed.  Your skin tone and color start to look more normal. Follow these instructions at home:  Take over-the-counter and prescription medicines only as told by your health care provider.  Do not take sodium tablets. Doing this can lead to having too much sodium in your body (hypernatremia). Contact a health care provider if:  You continue to have symptoms of mild or moderate  dehydration, such as: ? Thirst. ? Dry lips. ? Slightly dry mouth. ? Dizziness. ? Dark urine or less urine than normal. ? Muscle cramps.  You continue to vomit or have diarrhea. Get help right away if you:  Have symptoms of dehydration that get worse.  Have a fever.  Have a severe headache.  Have been vomiting and the following happens: ? Your vomiting gets worse or does not go away. ? Your vomit includes blood or green matter (bile). ? You cannot eat or drink without vomiting.  Have problems with urination or bowel movements, such as: ? Diarrhea that gets worse or does not go away. ? Blood in your stool (feces). This may cause stool to look black and tarry. ? Not urinating, or urinating only a small amount of very dark urine, within 6-8 hours.  Have trouble breathing.  Have symptoms that get worse  with treatment. These symptoms may represent a serious problem that is an emergency. Do not wait to see if the symptoms will go away. Get medical help right away. Call your local emergency services (911 in the [place] Do not drive yourself to the hospital. Summary  Rehydration is the replacement of body fluids and minerals (electrolytes) that are lost during dehydration.  Follow instructions from your health care provider for rehydration. The kind of fluid and amount you should drink depend on your condition.  Slowly increase how much you drink until you have taken the amount recommended by your health care provider.  Contact your health care provider if you continue to show signs of mild or moderate dehydration. This information is not intended to replace advice given to you by your health care provider. Make sure you discuss any questions you have with your health care provider. Document Revised: 11/23/2019 Document Reviewed: 10/03/2019 Elsevier Patient Education  2021 [employer].

## 2020-11-23 NOTE — Progress Notes (Signed)
[facility] OFFICE PROGRESS NOTE   Diagnosis: Head neck cancer  INTERVAL HISTORY:   Terri [name] completed another cycle of pembrolizumab on 11/02/2020.  No rash or diarrhea.  She complains of constant pain at the right submandibular mass.  The pain is partially relieved with oxycodone.  The pain is limiting her appetite.  She continues to have thrush over the tongue despite Diflucan.  She takes oxycodone 4-5 times per day.  No other sites of pain.  Objective:  Vital signs in last 24 hours:  Blood pressure 136/77, pulse 93, temperature (!) 97.1 F (36.2 C), temperature source Tympanic, resp. rate 18, height '5\' 5"'  (1.651 m), weight 125 lb 11.2 oz (57 kg), SpO2 100 %.    HEENT: Fungating ulcerated mass at the right submandibular area, thick white coat over the tongue, she is unable to open her mouth beyond a centimeter Lymphatics: No cervical, supraclavicular, or axillary nodes Resp: Lungs clear bilaterally Cardio: Regular rate and rhythm GI: No hepatosplenomegaly Vascular: No leg edema   Portacath/PICC-without erythema  Lab Results:  Lab Results  Component Value Date   WBC 6.5 11/23/2020   HGB 12.5 11/23/2020   HCT 38.2 11/23/2020   MCV 89.0 11/23/2020   PLT 229 11/23/2020   NEUTROABS 5.3 11/23/2020    CMP  Lab Results  Component Value Date   NA 138 11/02/2020   K 3.9 11/02/2020   CL 102 11/02/2020   CO2 28 11/02/2020   GLUCOSE 84 11/02/2020   BUN 25 (H) 11/02/2020   CREATININE 0.79 11/02/2020   CALCIUM 11.0 (H) 11/02/2020   PROT 7.8 11/02/2020   ALBUMIN 3.4 (L) 11/02/2020   AST 18 11/02/2020   ALT 17 11/02/2020   ALKPHOS 80 11/02/2020   BILITOT 0.4 11/02/2020   GFRNONAA >60 11/02/2020   GFRAA >60 05/21/2020    Medications: I have reviewed the patient's current medications.   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 Nicolette Bang -Pathology showed 4.2 cm tumor with margins negative, 17 mm depth of invasion, moderately to poorly differentiated, positive neural invasion, 2/17 lymph nodes positive, ECE positive, PT 4 APN 3B.  PD-L1 CPS greater than 10 -IMRT with weekly cisplatin from 06/27/2019 through 07/25/2019. -PET scan on 11/04/2019 showed hypermetabolic level 1B lymph node in the right submental region measuring 5 mm. Left upper lobe pulmonary nodule measuring 8 mm. -PET scan on 02/13/2020 with ill-defined 9 mm short axis level 1B node with SUV 8.6, previously 5.6 SUV. 2.1 cm short axis superior mediastinal/prevascular node previously 8 mm SUV 13.2. -Bronchoscopy on 03/06/2020 with biopsy of the left lung lesion consistent with squamous cell carcinoma. Right lung lesion shows atypical cells. -XRT to the right and left lung lesions completed on 04/02/2020 -She met with Dr. Lorrene Reid at [facility] who offered surgical resection. Patient was reluctant to consider it given morbidity associated with it. -PET scan on 08/27/2020 showed 19 mm short axis right submandibular node, maximum SUV 12.0, previously 11.5. PET scan on 02/13/2020 showed SUV of 7.32, with lymph node measuring 9 mm in short axis. Mild hypermetabolism along the right lateral chest wall, SUV 4.2. 7 mm short axis anterior mediastinal/prevascular lymph node, maximum SUV 4.1. -CT neck 09/18/2020-increase in right submandibular metastatic lymph node extending into the right sublingual region, postsurgical changes from right neck dissection -Cycle 1 pembrolizumab 09/19/2020 -Cycle 2 Pembrolizumab 10/12/2020 -Cycle 3 Pembrolizumab 11/02/2020 -Cycle 4  pembrolizumab 11/23/2020  2.Pain secondary to the right submandibular mass-improved 11/02/2020.  3.  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  She has completed 3 cycles of pembrolizumab.  She has tolerated  the treatment well, but there is clinical evidence of disease progression.  The right neck mass appears larger, she has increased pain, and the calcium is elevated.  I discussed the clinical findings and treatment options with Terri [name] and her son.  Pembrolizumab will be discontinued.  I contacted Dr. Isidore Moos.  She will see Terri [name] next week to plan palliative radiation to the submandibular mass.  We will consider systemic treatment options after the completion of radiation.  Terri [name] will begin MS Contin.  She will increase the oxycodone to 5-10 mg every 4 hours as needed.  She will receive intravenous fluids and [name] today.  We reviewed potential toxicities associated with [name] including the chance of flulike symptoms and osteonecrosis.  She agrees to proceed.  She will begin a trial of Mycelex for the oral candidiasis.  Terri [name] will return for an office visit in 1 week.  Betsy Coder, MD  11/23/2020  11:28 AM

## 2020-11-23 NOTE — Progress Notes (Signed)
Entered orders for IVF and percocet while here.

## 2020-11-23 NOTE — Progress Notes (Signed)
Per Dr. Benay Spice: ordered mycelex troche and dietary referral.

## 2020-11-23 NOTE — Telephone Encounter (Signed)
Scheduled appointments per 2/18 los. Spoke to patient who is aware of appointments dates and times.

## 2020-11-28 ENCOUNTER — Other Ambulatory Visit: Payer: Self-pay | Admitting: *Deleted

## 2020-11-29 ENCOUNTER — Ambulatory Visit (HOSPITAL_COMMUNITY)
Admission: RE | Admit: 2020-11-29 | Discharge: 2020-11-29 | Disposition: A | Discharge status: Home / Self Care | Payer: BC Managed Care – PPO | Source: Ambulatory Visit | Attending: Oncology | Admitting: Oncology

## 2020-11-29 ENCOUNTER — Other Ambulatory Visit: Payer: Self-pay

## 2020-11-29 DIAGNOSIS — C023 Malignant neoplasm of anterior two-thirds of tongue, part unspecified: Secondary | ICD-10-CM | POA: Insufficient documentation

## 2020-11-29 MED ORDER — IOHEXOL 300 MG/ML  SOLN
75.0000 mL | Freq: Once | INTRAMUSCULAR | Status: AC | PRN
  Administered 2020-11-29: 75 mL via INTRAVENOUS

## 2020-11-30 ENCOUNTER — Inpatient Hospital Stay: Payer: BC Managed Care – PPO

## 2020-11-30 ENCOUNTER — Other Ambulatory Visit: Payer: BC Managed Care – PPO

## 2020-11-30 ENCOUNTER — Ambulatory Visit
Admission: RE | Admit: 2020-11-30 | Discharge: 2020-11-30 | Disposition: A | Discharge status: Home / Self Care | Payer: BC Managed Care – PPO | Source: Ambulatory Visit | Attending: Radiation Oncology | Admitting: Radiation Oncology

## 2020-11-30 ENCOUNTER — Inpatient Hospital Stay: Payer: BC Managed Care – PPO | Admitting: Nurse Practitioner

## 2020-11-30 ENCOUNTER — Encounter: Payer: Self-pay | Admitting: Nurse Practitioner

## 2020-11-30 ENCOUNTER — Other Ambulatory Visit: Payer: Self-pay

## 2020-11-30 VITALS — BP 121/73 | HR 90 | Temp 98.8°F | Resp 17 | Ht 65.0 in | Wt 123.0 lb

## 2020-11-30 VITALS — BP 132/72 | HR 104 | Temp 97.0°F | Resp 20 | Ht 65.0 in | Wt 124.4 lb

## 2020-11-30 DIAGNOSIS — C023 Malignant neoplasm of anterior two-thirds of tongue, part unspecified: Secondary | ICD-10-CM | POA: Diagnosis present

## 2020-11-30 DIAGNOSIS — C021 Malignant neoplasm of border of tongue: Secondary | ICD-10-CM

## 2020-11-30 DIAGNOSIS — Z95828 Presence of other vascular implants and grafts: Secondary | ICD-10-CM

## 2020-11-30 DIAGNOSIS — G893 Neoplasm related pain (acute) (chronic): Secondary | ICD-10-CM | POA: Insufficient documentation

## 2020-11-30 DIAGNOSIS — C770 Secondary and unspecified malignant neoplasm of lymph nodes of head, face and neck: Secondary | ICD-10-CM | POA: Diagnosis not present

## 2020-11-30 DIAGNOSIS — C01 Malignant neoplasm of base of tongue: Secondary | ICD-10-CM | POA: Insufficient documentation

## 2020-11-30 DIAGNOSIS — B370 Candidal stomatitis: Secondary | ICD-10-CM | POA: Insufficient documentation

## 2020-11-30 LAB — CMP (CANCER CENTER ONLY)
ALT: 27 U/L (ref 0–44)
AST: 23 U/L (ref 15–41)
Albumin: 3.4 g/dL — ABNORMAL LOW (ref 3.5–5.0)
Alkaline Phosphatase: 78 U/L (ref 38–126)
Anion gap: 10 (ref 5–15)
BUN: 19 mg/dL (ref 8–23)
CO2: 23 mmol/L (ref 22–32)
Calcium: 9.5 mg/dL (ref 8.9–10.3)
Chloride: 102 mmol/L (ref 98–111)
Creatinine: 0.76 mg/dL (ref 0.44–1.00)
GFR, Estimated: >60 mL/min (ref >60)
Glucose, Bld: 96 mg/dL (ref 70–99)
Potassium: 4.5 mmol/L (ref 3.5–5.1)
Sodium: 135 mmol/L (ref 135–145)
Total Bilirubin: 0.4 mg/dL (ref 0.3–1.2)
Total Protein: 7.6 g/dL (ref 6.5–8.1)

## 2020-11-30 MED ORDER — SODIUM CHLORIDE 0.9% FLUSH
10.0000 mL | Freq: Once | INTRAVENOUS | Status: AC
  Administered 2020-11-30: 10 mL
  Filled 2020-11-30: qty 10

## 2020-11-30 MED ORDER — HEPARIN SOD (PORK) LOCK FLUSH 100 UNIT/ML IV SOLN
500.0000 [IU] | Freq: Once | INTRAVENOUS | Status: AC
Start: 2020-11-30 — End: 2020-11-30
  Administered 2020-11-30: 500 [IU]
  Filled 2020-11-30: qty 5

## 2020-11-30 NOTE — Progress Notes (Signed)
Radiation Oncology         [phone] ________________________________  Re-Consultation Note  Name: Terri [name] MRN: [id number]  Date: 11/30/2020  DOB: 1951/02/18  NI:[email or web address], Morley Kos, DO  Ladell Pier, MD   REFERRING PHYSICIAN: Ladell Pier, MD  DIAGNOSIS:    ICD-10-CM   1. 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) Staging form: Oral Cavity, AJCC 8th Edition - Clinical: Stage IVB (cT4a, cN3b, cM0) - Signed by Derek Jack, MD on 05/30/2019 Histologic grade (G): G3 Histologic grading system: 3 grade system Presence of extranodal extension: Present Perineural invasion (PNI): Present ECOG performance status: Grade 0 P16 overexpression: Not assessed Human papillomavirus (HPV) status: Unknown - Pathologic stage from 06/07/2019: Stage IVB (pT4a, pN3b, cM0) - Signed by Eppie Gibson, MD on 06/07/2019 Stage prefix: Initial diagnosis Histologic grade (G): G3 Histologic grading system: 3 grade system  Now stage IV progressive, recurrent disease Distant metastatic disease is satisfactorily controlled but local regional disease is progressive.  CHIEF COMPLAINT: Here to discuss management of tongue cancer, progressive  HISTORY OF PRESENT ILLNESS::Terri [name] is a 70 y.o. female who returns for re-consultation. She has been treated by me on two different occasions for tongue cancer (2020) and lung cancer (2021). She was last seen in follow-up on 10/19/2020, at which time her recent imaging studies were indicative of significant tumor progression in the right submandibular region, fungating through the skin. We discussed palliative re-irradiation to help with her symptoms where the submandibular tumor is growing.   Since her last visit, she was seen by Dr. Benay Spice on 11/23/2020. It was  recommended to discontinue Pembrolizumab, status post three cycles, and move forward with palliative radiation to the submandibular mass which continued to progress. Systemic treatment options will be considered after the completion of radiation.  She also underwent restaging CT scans of the neck and chest on 11/29/2020.  I personally reviewed her imaging.  The scan of her neck shows marked progression of the necrotic mass in the base of the tongue and the right submandibular region.  There is mass-effect in the left lateral tongue that is inflammatory versus neoplastic.  The CT scan of her chest shows relative stability compared to her PET scan from November.  PAST MEDICAL HISTORY:  has a past medical history of Arthritis, Cancer ([place]), GERD (gastroesophageal reflux disease), History of radiation therapy (06/27/19- 08/05/19), Hypertension, LBBB (left bundle branch block), Port-A-Cath in place (06/14/2019), and Wears glasses.    PAST SURGICAL HISTORY: Past Surgical History:  Procedure Laterality Date  . BRONCHIAL BIOPSY  03/06/2020   Procedure: BRONCHIAL BIOPSIES;  Surgeon: Collene Gobble, MD;  Location: [facility] ENDOSCOPY;  Service: Pulmonary;;  . BRONCHIAL BRUSHINGS  03/06/2020   Procedure: BRONCHIAL BRUSHINGS;  Surgeon: Collene Gobble, MD;  Location: [facility] ENDOSCOPY;  Service: Pulmonary;;  . BRONCHIAL NEEDLE ASPIRATION BIOPSY  03/06/2020   Procedure: BRONCHIAL NEEDLE ASPIRATION BIOPSIES;  Surgeon: Collene Gobble, MD;  Location: [place];  Service: Pulmonary;;  . BRONCHIAL WASHINGS  03/06/2020   Procedure: BRONCHIAL WASHINGS;  Surgeon: Collene Gobble, MD;  Location: [facility] ENDOSCOPY;  Service: Pulmonary;;  . COLONOSCOPY W/ BIOPSIES AND POLYPECTOMY    . 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 Dr.  Waltonen at [facility]  . PORTACATH PLACEMENT Left 06/10/2019   Procedure: INSERTION PORT-A-CATH;  Surgeon: Aviva Signs, MD;  Location: [facility]  ORS;  Service: General;  Laterality: Left;  pt knows to arrive at 6:15  . TUBAL LIGATION    . VIDEO BRONCHOSCOPY WITH ENDOBRONCHIAL NAVIGATION N/A 03/06/2020   Procedure: VIDEO BRONCHOSCOPY WITH ENDOBRONCHIAL NAVIGATION;  Surgeon: Collene Gobble, MD;  Location: [place] ENDOSCOPY;  Service: Pulmonary;  Laterality: N/A;    FAMILY HISTORY: family history includes Heart failure in her father and mother.  SOCIAL HISTORY:  reports that she has never smoked. She has never used smokeless tobacco. She reports that she does not drink alcohol and does not use drugs.  ALLERGIES: Patient has no known allergies.  MEDICATIONS:  Current Outpatient Medications  Medication Sig Dispense Refill  . acetaminophen (TYLENOL) 650 MG CR tablet Take 1,300 mg by mouth every 8 (eight) hours as needed for pain.    . clotrimazole (MYCELEX) 10 MG troche Take 1 tablet (10 mg total) by mouth in the morning, at noon, in the evening, and at bedtime. 120 Troche 1  . DENTA 5000 PLUS 1.1 % CREA dental cream Place 1 application onto teeth at bedtime.     Mariane Baumgarten Sodium (COLACE PO) Take 1 capsule by mouth daily as needed.    . lidocaine-prilocaine (EMLA) cream Apply 1 application topically as directed. Apply to port site 1-2 hours prior to stick and cover w/plastic wrap 30 g 1  . metroNIDAZOLE (FLAGYL) 250 MG tablet Take 1 tablet (250 mg total) by mouth 2 (two) times daily. 40 tablet 0  . morphine (MS CONTIN) 15 MG 12 hr tablet Take 1 tablet (15 mg total) by mouth every 12 (twelve) hours. 60 tablet 0  . OVER THE COUNTER MEDICATION Take 1 tablet by mouth daily. VS-C supplement    . oxyCODONE (OXY IR/ROXICODONE) 5 MG immediate release tablet Take 1 tablet (5 mg total) by mouth every 4 (four) hours as needed. 120 tablet 0  . Polyethylene Glycol 3350 (MIRALAX PO) Take 17 g by mouth daily as needed.    . Probiotic CAPS Take 1 capsule by mouth daily.     No current facility-administered medications for this encounter.    Facility-Administered Medications Ordered in Other Encounters  Medication Dose Route Frequency Provider Last Rate Last Admin  . heparin lock flush 100 unit/mL  500 Units Intracatheter Once PRN Derek Jack, MD      . sodium chloride 0.9 % 1,000 mL with potassium chloride 20 mEq, magnesium sulfate 2 g infusion   Intravenous Once Derek Jack, MD      . sodium chloride flush (NS) 0.9 % injection 10 mL  10 mL Intracatheter Once PRN Derek Jack, MD        REVIEW OF SYSTEMS:  Notable for that above.   PHYSICAL EXAM:  height is 5\' 5"  (1.651 m) and weight is 124 lb 6 oz (56.4 kg). Her temporal temperature is 97 F (36.1 C) (abnormal). Her blood pressure is 132/72 and her pulse is 104 (abnormal). Her respiration is 20 and oxygen saturation is 100%.   General: Alert and oriented, in no acute distress  HEENT: She has severe trismus.  She has a white coating in her mouth which is thrush plus or minus sediment and debris.  I cannot examine the tongue very well due to the trismus but she appears to have exophytic tumor arising from the tongue diffusely.   Neck: There is a fungating necrotic mass  arising from the right submandibular region, several centimeters in size  ECOG = 1  0 - Asymptomatic (Fully active, able to carry on all predisease activities without restriction)  1 - Symptomatic but completely ambulatory (Restricted in physically strenuous activity but ambulatory and able to carry out work of a light or sedentary nature. For example, light housework, office work)  2 - Symptomatic, <50% in bed during the day (Ambulatory and capable of all self care but unable to carry out any work activities. Up and about more than 50% of waking hours)  3 - Symptomatic, >50% in bed, but not bedbound (Capable of only limited self-care, confined to bed or chair 50% or more of waking hours)  4 - Bedbound (Completely disabled. Cannot carry on any self-care. Totally confined to bed or  chair)  5 - Death   Eustace Pen MM, Creech RH, Tormey DC, et al. [phone]). "Toxicity and response criteria of the [facility]". [place] Oncol. 5 (6): 649-55   LABORATORY DATA:  Lab Results  Component Value Date   WBC 6.5 11/23/2020   HGB 12.5 11/23/2020   HCT 38.2 11/23/2020   MCV 89.0 11/23/2020   PLT 229 11/23/2020   CMP     Component Value Date/Time   NA 135 11/30/2020 0938   K 4.5 11/30/2020 0938   CL 102 11/30/2020 0938   CO2 23 11/30/2020 0938   GLUCOSE 96 11/30/2020 0938   BUN 19 11/30/2020 0938   CREATININE 0.76 11/30/2020 0938   CALCIUM 9.5 11/30/2020 0938   PROT 7.6 11/30/2020 0938   ALBUMIN 3.4 (L) 11/30/2020 0938   AST 23 11/30/2020 0938   ALT 27 11/30/2020 0938   ALKPHOS 78 11/30/2020 0938   BILITOT 0.4 11/30/2020 0938   GFRNONAA >60 11/30/2020 0938   GFRAA >60 05/21/2020 1520         RADIOGRAPHY: CT SOFT TISSUE NECK W CONTRAST  Result Date: 11/30/2020 CLINICAL DATA:  Head and neck cancer. Assess treatment response. Tongue cancer. EXAM: CT NECK WITH CONTRAST TECHNIQUE: Multidetector CT imaging of the neck was performed using the standard protocol following the bolus administration of intravenous contrast. CONTRAST:  80mL OMNIPAQUE IOHEXOL 300 MG/ML  SOLN COMPARISON:  CT neck 09/18/2020.  PET CT 08/27/2020 FINDINGS: Pharynx and larynx: Large area of necrotic tumor in the region of the right tongue base has progressed significantly since the prior study. This mass measures approximately 3.6 x 4.0 cm. There is mass-effect on the airway which remains patent. There is a large area of soft tissue mass in the left lateral tongue of uncertain etiology. This shows diffuse increase metabolism on PET and could be inflammatory or tumor. Recommend physical exam of this area. This area measures approximately 5.3 x 2.3 cm. This has progressed since the prior CT. Vocal cords normal.  Airway intact. Salivary glands: Atrophic left submandibular gland. Right  submandibular gland appears encased by tumor. Negative parotid bilaterally. Thyroid: Small 5 mm thyroid nodules bilaterally. No further imaging necessary. Lymph nodes: Large necrotic submandibular lymph node mass on the right. This is ulcerated to the skin surface and shows marked progression from the prior CT. This is encases the right submandibular gland. This mass measures approximately 6 x 7.8 cm. No enlarged lymph nodes in the left neck. Vascular: Normal vascular enhancement. Right jugular vein is encased and narrowed by tumor but is patent. Limited intracranial: Negative Visualized orbits: Negative Mastoids and visualized paranasal sinuses: Paranasal sinuses clear. Mastoid clear. Skeleton: Erosive changes of the  inferior margin of the body of the mandible on the right compatible with tumor invasion. This has progressed since the prior CT. No other bony metastasis identified. Upper chest: Chest CT today reported separately Other: None IMPRESSION: 1. Marked progression of necrotic mass in the base of the tongue measuring approximately 3.6 x 4.0 cm. There is significant mass-effect on the airway which is patent. 2. There is marked enlargement of necrotic and ulcerated mass in the right submandibular region compatible with lymph node progression. This mass measures 6 x 7.8 cm. 3. Large area of mass-effect in the left lateral tongue of uncertain etiology. This was hypermetabolic on PET and could be inflammatory or due to tumor. Correlate with physical exam. 4. Progressive bony destruction of the mandible compatible with metastatic disease. Electronically Signed   By: Franchot Gallo M.D.   On: 11/30/2020 09:30   CT CHEST W CONTRAST  Result Date: 11/30/2020 CLINICAL DATA:  Restaging of head neck/tongue cancer. EXAM: CT CHEST WITH CONTRAST TECHNIQUE: Multidetector CT imaging of the chest was performed during intravenous contrast administration. CONTRAST:  29mL OMNIPAQUE IOHEXOL 300 MG/ML  SOLN COMPARISON:   08/27/2020 PET.  02/27/2020 chest CT. FINDINGS: Cardiovascular: Aortic atherosclerosis. Left Port-A-Cath tip at low SVC. 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 Normal heart size, without pericardial effusion. No central pulmonary embolism, on this non-dedicated study. Mediastinum/Nodes: No mediastinal or hilar adenopathy. Lungs/Pleura: Left hemidiaphragm elevation.  No pleural fluid. 3 mm right upper lobe pulmonary nodule on 40/5 is at the site of a 10 mm nodule on 02/27/2020 CT. Mild surrounding ground-glass opacity. Relatively similar today compared to 08/27/2020. Minimal motion degradation inferiorly. Medial left upper lobe pulmonary nodule measures 9 mm on 47/5 and is at the site of a 3.2 x 2.2 cm mass back on 02/27/2020. Measures maximally 11 mm on 08/27/2020 PET. Upper Abdomen: Normal imaged portions of the liver, spleen, stomach, pancreas, gallbladder, right adrenal gland, kidneys. Left adrenal thickening with maintenance of adreniform shape. Minimal motion degradation in the upper abdomen. Musculoskeletal: Accentuation of expected thoracic kyphosis with cervical and thoracic spondylosis. IMPRESSION: 1. Compared to the most recent diagnostic CT of 02/27/2020, significant decrease in size of peripheral right upper and medial left upper lobe lung lesions. Compared to the most recent PET of 08/27/2020, similar to minimal decrease in size of these nodules. The right upper lobe nodule demonstrates mild surrounding ground-glass, which could be radiation induced. 2. No new or progressive metastatic disease within the chest. 3. Left hemidiaphragm elevation. 4.  Aortic Atherosclerosis (ICD10-I70.0). Electronically Signed   By: Abigail Miyamoto M.D.   On: 11/30/2020 15:53      IMPRESSION/PLAN: Tongue cancer  I had a lengthy discussion with the patient as well as with her son, who is available on speaker phone today.  She has progressive disease in the head and neck region where she  previously underwent radiation therapy.  Unfortunately this has not responded well to immunotherapy.  She is in a great deal of pain from this mass.  It is oozing and emitting an odor.  She would like to know about other palliative treatment options.  I believe the best option for her is hypofractionated reirradiation, focusing on the bulky disease in her tongue and her right submandibular region.  I recommend a [employer] treatment regimen which would involve getting 2 doses of treatment per day, x2 days in a row.  The treatments will be given at least 6 hours apart.  Based on the medical literature,  as well as my experience using this regimen, patients tend to have less acute side effects than longer treatment regimens.  This is a good option for many patients with incurable disease.  The majority of patients have partial palliation of their symptoms.  She is enthusiastic about this treatment and she understands that if she has a palliative benefit from it we can repeat it about a month later.  She understands that there are risks to reirradiation including serious tissue damage in the mouth or neck which in some circumstances can cause a fatal bleed or other complication to the normal tissues in the irradiated region.  She understands she may have some taste changes from the treatment, fatigue, skin irritation, dental injury, mandibular injury, spinal cord injury, injury to the esophagus, lymphedema.  However, the risk of treatment is significantly less than the risk of allowing the tumor to progress.  We will perform a treatment planning session today and start her treatment early next week.  She and her son are enthusiastic about this.  Consent form was signed and placed in her chart today.  On date of service, in total, I spent 35 minutes on this encounter. Patient was seen in person.   __________________________________________   Eppie Gibson, MD  This document serves as a record of services  personally performed by Eppie Gibson, MD. It was created on his behalf by Clerance Lav, a trained medical scribe. The creation of this record is based on the scribe's personal observations and the provider's statements to them. This document has been checked and approved by the attending provider.

## 2020-11-30 NOTE — Patient Instructions (Signed)
Implanted Port Insertion, Care After This sheet gives you information about how to care for yourself after your procedure. Your health care provider may also give you more specific instructions. If you have problems or questions, contact your health care provider. What can I expect after the procedure? After the procedure, it is common to have:  Discomfort at the port insertion site.  Bruising on the skin over the port. This should improve over 3-4 days. Follow these instructions at home: Port care  After your port is placed, you will get a manufacturer's information card. The card has information about your port. Keep this card with you at all times.  Take care of the port as told by your health care provider. Ask your health care provider if you or a family member can get training for taking care of the port at home. A home health care nurse may also take care of the port.  Make sure to remember what type of port you have. Incision care  Follow instructions from your health care provider about how to take care of your port insertion site. Make sure you: ? Wash your hands with soap and water before and after you change your bandage (dressing). If soap and water are not available, use hand sanitizer. ? Change your dressing as told by your health care provider. ? Leave stitches (sutures), skin glue, or adhesive strips in place. These skin closures may need to stay in place for 2 weeks or longer. If adhesive strip edges start to loosen and curl up, you may trim the loose edges. Do not remove adhesive strips completely unless your health care provider tells you to do that.  Check your port insertion site every day for signs of infection. Check for: ? Redness, swelling, or pain. ? Fluid or blood. ? Warmth. ? Pus or a bad smell.      Activity  Return to your normal activities as told by your health care provider. Ask your health care provider what activities are safe for you.  Do not  lift anything that is heavier than 10 lb (4.5 kg), or the limit that you are told, until your health care provider says that it is safe. General instructions  Take over-the-counter and prescription medicines only as told by your health care provider.  Do not take baths, swim, or use a hot tub until your health care provider approves. Ask your health care provider if you may take showers. You may only be allowed to take sponge baths.  Do not drive for 24 hours if you were given a sedative during your procedure.  Wear a medical alert bracelet in case of an emergency. This will tell any health care providers that you have a port.  Keep all follow-up visits as told by your health care provider. This is important. Contact a health care provider if:  You cannot flush your port with saline as directed, or you cannot draw blood from the port.  You have a fever or chills.  You have redness, swelling, or pain around your port insertion site.  You have fluid or blood coming from your port insertion site.  Your port insertion site feels warm to the touch.  You have pus or a bad smell coming from the port insertion site. Get help right away if:  You have chest pain or shortness of breath.  You have bleeding from your port that you cannot control. Summary  Take care of the port as told by your   health care provider. Keep the manufacturer's information card with you at all times.  Change your dressing as told by your health care provider.  Contact a health care provider if you have a fever or chills or if you have redness, swelling, or pain around your port insertion site.  Keep all follow-up visits as told by your health care provider. This information is not intended to replace advice given to you by your health care provider. Make sure you discuss any questions you have with your health care provider. Document Revised: 04/20/2018 Document Reviewed: 04/20/2018 Elsevier Patient Education   2021 Elsevier Inc.  

## 2020-11-30 NOTE — Progress Notes (Addendum)
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 Mass drains, painful to touch, and makes oral intake difficult for patient. She was started on pembrolizumab on 09/19/2020 but still appeared to have disease progression after 3 cycles.  CT Neck with Contrast 11/29/2020 --IMPRESSION: 1. Marked progression of necrotic mass in the base of the tongue measuring approximately 3.6 x 4.0 cm. There is significant mass-effect on the airway which is patent. 2. There is marked enlargement of necrotic and ulcerated mass in the right submandibular region compatible with lymph node progression. This mass measures 6 x 7.8 cm. 3. Large area of mass-effect in the left lateral tongue of uncertain etiology. This was hypermetabolic on PET and could be inflammatory or due to tumor. Correlate with physical exam. 4. Progressive bony destruction of the mandible compatible with metastatic disease.  Nutrition Status Yes No Comments  Weight changes? [x]  []  Reports she continues to unintentionally lose weight  Swallowing concerns? [x]  []  Patient can only tolerate liquid supplements or very soft foods  PEG? []  [x]     Referrals Yes No Comments  Social Work? [x]  []    Dentistry? []  [x]    Swallowing therapy? [x]  []    Nutrition? [x]  []    Med/Onc? [x]  []  Dr. Betsy Coder   Safety Issues Yes No Comments  Prior radiation? [x]  []  06/27/2019 through 08/05/2019 Site Technique Total Dose (Gy) Dose per Fx (Gy) Completed Fx Beam Energies  Head & neck: HN_tongue IMRT 60/60 2 30/30 6X   03/21/2020 through 04/03/2020 Site Technique Total Dose (Gy) Dose per Fx (Gy) Completed Fx Beam Energies  Lung, Right: Lung_Rt IMRT 50/50 10 5/5 6XFFF  Lung, Left: Lung_Lt IMRT 50/50 5 10/10 6XFFF    Pacemaker/ICD? []  [x]    Possible current pregnancy? []  [x]  Post  menopausal   Is the patient on methotrexate? []  [x]     Tobacco/Marijuana/Snuff/ETOH use:  Patient has never smoked or used smokeless tobacco. Denies any alcohol or recreational drug use  Past/Anticipated interventions by otolaryngology, if any:  Last saw Dr. Francina Ames on 06/13/2020 --There is no evidence of disease in the tongue on exam today.  --The right submandibular node seems to be progressing, enlarging and becoming more symptomatic --We discussed excising this in the OR. She would like to hold off on that. We discussed an FNA, but I would be concerned about false negative test in this case.  --I will send a message to Dr Delton Coombes about what systemic therapy options might be available to her.   Past/Anticipated interventions by medical oncology, if any:  Under care of Dr. Betsy Coder 11/23/2020  --Ms. Jamerson has a history of metastatic head neck cancer. --She has completed 3 cycles of pembrolizumab.  She has tolerated the treatment well, but there is clinical evidence of disease progression.  The right neck mass appears larger, she has increased pain, and the calcium is elevated. --I discussed the clinical findings and treatment options with Ms. Mcclish and her son.  Pembrolizumab will be discontinued.  I contacted Dr. Isidore Moos.  She will see Ms. Fosdick next week to plan palliative radiation to the submandibular mass. --We will consider systemic treatment options after the completion of radiation. --Ms. Mcmannis will begin MS Contin.  She will increase the oxycodone to 5-10 mg every 4 hours as needed. --She will receive intravenous fluids and Zometa today.  We reviewed  potential toxicities associated with Zometa including the chance of flulike symptoms and osteonecrosis.  She agrees to proceed. --She will begin a trial of Mycelex for the oral candidiasis.  Saw Marlynn Perking earlier today 11/30/2020  Current Complaints / other details:   Patient has received both Moderna vaccines  as well as [employer] booster

## 2020-11-30 NOTE — Progress Notes (Signed)
[place] OFFICE PROGRESS NOTE   Diagnosis: Head and neck cancer  INTERVAL HISTORY:   Ms. Benedetti returns as scheduled.  Pembrolizumab was held last week.  She received IV fluids and Zometa.  She was referred for CT scans.  She is seen today to review the results.  He denies dysphagia.  She is tolerating liquids.  No fever or shortness of breath.  For pain she is taking MS Contin 15 mg every 12 hours with oxycodone as needed for breakthrough pain.  She typically takes the oxycodone twice a day.  Overall she notes improved pain control.  Objective:  Vital signs in last 24 hours:  Blood pressure 121/73, pulse 90, temperature 98.8 F (37.1 C), temperature source Tympanic, resp. rate 17, height '5\' 5"'  (1.651 m), weight 123 lb (55.8 kg), SpO2 100 %.    HEENT: Thick white coating over tongue.  Fungating ulcerated mass right submandibular/upper neck regions. Resp: Lungs clear bilaterally. Cardio: Regular rate and rhythm. GI: No hepatomegaly. Vascular: No leg edema. Port-A-Cath without erythema.  Lab Results:  Lab Results  Component Value Date   WBC 6.5 11/23/2020   HGB 12.5 11/23/2020   HCT 38.2 11/23/2020   MCV 89.0 11/23/2020   PLT 229 11/23/2020   NEUTROABS 5.3 11/23/2020    Imaging:  CT SOFT TISSUE NECK W CONTRAST  Result Date: 11/30/2020 CLINICAL DATA:  Head and neck cancer. Assess treatment response. Tongue cancer. EXAM: CT NECK WITH CONTRAST TECHNIQUE: Multidetector CT imaging of the neck was performed using the standard protocol following the bolus administration of intravenous contrast. CONTRAST:  71m OMNIPAQUE IOHEXOL 300 MG/ML  SOLN COMPARISON:  CT neck 09/18/2020.  PET CT 08/27/2020 FINDINGS: Pharynx and larynx: Large area of necrotic tumor in the region of the right tongue base has progressed significantly since the prior study. This mass measures approximately 3.6 x 4.0 cm. There is mass-effect on the airway which remains patent. There is a large area  of soft tissue mass in the left lateral tongue of uncertain etiology. This shows diffuse increase metabolism on PET and could be inflammatory or tumor. Recommend physical exam of this area. This area measures approximately 5.3 x 2.3 cm. This has progressed since the prior CT. Vocal cords normal.  Airway intact. Salivary glands: Atrophic left submandibular gland. Right submandibular gland appears encased by tumor. Negative parotid bilaterally. Thyroid: Small 5 mm thyroid nodules bilaterally. No further imaging necessary. Lymph nodes: Large necrotic submandibular lymph node mass on the right. This is ulcerated to the skin surface and shows marked progression from the prior CT. This is encases the right submandibular gland. This mass measures approximately 6 x 7.8 cm. No enlarged lymph nodes in the left neck. Vascular: Normal vascular enhancement. Right jugular vein is encased and narrowed by tumor but is patent. Limited intracranial: Negative Visualized orbits: Negative Mastoids and visualized paranasal sinuses: Paranasal sinuses clear. Mastoid clear. Skeleton: Erosive changes of the inferior margin of the body of the mandible on the right compatible with tumor invasion. This has progressed since the prior CT. No other bony metastasis identified. Upper chest: Chest CT today reported separately Other: None IMPRESSION: 1. Marked progression of necrotic mass in the base of the tongue measuring approximately 3.6 x 4.0 cm. There is significant mass-effect on the airway which is patent. 2. There is marked enlargement of necrotic and ulcerated mass in the right submandibular region compatible with lymph node progression. This mass measures 6 x 7.8 cm. 3. Large area of mass-effect in  the left lateral tongue of uncertain etiology. This was hypermetabolic on PET and could be inflammatory or due to tumor. Correlate with physical exam. 4. Progressive bony destruction of the mandible compatible with metastatic disease.  Electronically Signed   By: Franchot Gallo M.D.   On: 11/30/2020 09:30    Medications: I have reviewed the patient's current medications.  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 Nicolette Bang -Pathology showed 4.2 cm tumor with margins negative, 17 mm depth of invasion, moderately to poorly differentiated, positive neural invasion, 2/17 lymph nodes positive, ECE positive, PT 4 APN 3B.PD-L1 CPS greater than 10 -IMRT with weekly cisplatin from 06/27/2019 through 07/25/2019. -PET scan on 11/04/2019 showed hypermetabolic level 1B lymph node in the right submental region measuring 5 mm. Left upper lobe pulmonary nodule measuring 8 mm. -PET scan on 02/13/2020 with ill-defined 9 mm short axis level 1B node with SUV 8.6, previously 5.6 SUV. 2.1 cm short axis superior mediastinal/prevascular node previously 8 mm SUV 13.2. -Bronchoscopy on 03/06/2020 with biopsy of the left lung lesion consistent with squamous cell carcinoma. Right lung lesion shows atypical cells. -XRT to the right and left lung lesions completed on 04/02/2020 -She met with Dr. Lorrene Reid at [facility] who offered surgical resection. Patient was reluctant to consider it given morbidity associated with it. -PET scan on 08/27/2020 showed 19 mm short axis right submandibular node, maximum SUV 12.0, previously 11.5. PET scan on 02/13/2020 showed SUV of 7.32, with lymph node measuring 9 mm in short axis. Mild hypermetabolism along the right lateral chest wall, SUV 4.2. 7 mm short axis anterior mediastinal/prevascular lymph node, maximum SUV 4.1. -CT neck 09/18/2020-increase in right submandibular metastatic lymph node extending into the right sublingual region, postsurgical changes from right neck dissection -Cycle 1 pembrolizumab 09/19/2020 -Cycle 2 Pembrolizumab 10/12/2020 -Cycle 3 Pembrolizumab  11/02/2020 -Neck CT 11/29/2020-large area of necrotic tumor in the region of the right tongue base progressed significantly since the prior study, it measures approximately 3.6 x 4.0 cm.  There is mass-effect on the airway which remains patent.  Large area of soft tissue mass in the left lateral tongue, uncertain etiology.  Shows diffuse increase metabolism on PET and could be inflammatory or tumor.  This area measures approximately 5.3 x 2.3 cm.  This has progressed since the prior CT.  Large necrotic submandibular lymph node mass on the right.  Ulcerated to the skin surface and shows marked progression from the prior CT.  Encases the right submandibular gland.  Measures approximately 6 x 7.8 cm.  Erosive changes of the inferior margin of the body of the mandible on the right compatible with tumor invasion.  This has progressed since the prior CT.  2.Pain secondary to the right submandibular mass-improved 11/02/2020.  3.  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 Openshaw appears unchanged.  She is scheduled for radiation simulation later today.  Dr. Benay Spice reviewed the neck CT report/images with Ms. Massey at today's appointment.  She understands there is evidence of progression.  Dr. Benay Spice recommends initiating chemotherapy with Taxol/carboplatin once she has completed course of radiation.  We reviewed potential toxicities associated with the chemotherapy including bone marrow toxicity, nausea, hair loss, allergic reaction.  We discussed the arthralgias and neuropathy associated with Taxol.  She understands the rationale for the dexamethasone premedication.  She agrees with this plan.  We reviewed the chemistry panel from today.  Calcium  is now in normal range.  We will continue MS Contin with oxycodone as needed for breakthrough pain.  We will see her in follow-up in 2 weeks.  We are available to see her sooner if needed.  Patient seen with Dr. Benay Spice.    Ned Card ANP/GNP-BC   11/30/2020  11:32 AM  This was a shared visit with Ned Card.  Ms. Burnett Harry has clinical and radiologic evidence of disease progression while on pembrolizumab.  She will see Dr. Isidore Moos later today to plan palliative radiation to the right submandibular/base of tongue mass.  We will plan for Taxol/carboplatin chemotherapy after radiation.  We reviewed potential toxicities associated with Taxol and carboplatin.  She agrees to proceed.  The pain has improved with MS Contin.  I was present for greater than 50% of today's visit.  I performed medical decision making.  Julieanne Manson, MD

## 2020-12-03 ENCOUNTER — Telehealth: Payer: Self-pay | Admitting: Nurse Practitioner

## 2020-12-03 DIAGNOSIS — C023 Malignant neoplasm of anterior two-thirds of tongue, part unspecified: Secondary | ICD-10-CM | POA: Diagnosis not present

## 2020-12-03 NOTE — Telephone Encounter (Signed)
Scheduled appointments per 2/25 los. Spoke to patient who is aware of appointments dates and times.  

## 2020-12-04 ENCOUNTER — Other Ambulatory Visit: Payer: Self-pay

## 2020-12-04 ENCOUNTER — Ambulatory Visit: Admission: RE | Admit: 2020-12-04 | Payer: BC Managed Care – PPO | Source: Ambulatory Visit

## 2020-12-04 ENCOUNTER — Encounter: Payer: Self-pay | Admitting: Radiation Oncology

## 2020-12-04 ENCOUNTER — Inpatient Hospital Stay: Payer: BC Managed Care – PPO | Attending: Nurse Practitioner

## 2020-12-04 ENCOUNTER — Ambulatory Visit
Admission: RE | Admit: 2020-12-04 | Discharge: 2020-12-04 | Disposition: A | Discharge status: Home / Self Care | Payer: BC Managed Care – PPO | Source: Ambulatory Visit | Attending: Radiation Oncology | Admitting: Radiation Oncology

## 2020-12-04 DIAGNOSIS — C023 Malignant neoplasm of anterior two-thirds of tongue, part unspecified: Secondary | ICD-10-CM | POA: Insufficient documentation

## 2020-12-04 DIAGNOSIS — Z79891 Long term (current) use of opiate analgesic: Secondary | ICD-10-CM | POA: Insufficient documentation

## 2020-12-04 DIAGNOSIS — C021 Malignant neoplasm of border of tongue: Secondary | ICD-10-CM | POA: Insufficient documentation

## 2020-12-04 DIAGNOSIS — G893 Neoplasm related pain (acute) (chronic): Secondary | ICD-10-CM | POA: Insufficient documentation

## 2020-12-04 DIAGNOSIS — Z923 Personal history of irradiation: Secondary | ICD-10-CM | POA: Insufficient documentation

## 2020-12-04 DIAGNOSIS — C770 Secondary and unspecified malignant neoplasm of lymph nodes of head, face and neck: Secondary | ICD-10-CM | POA: Insufficient documentation

## 2020-12-04 NOTE — Progress Notes (Signed)
Nutrition Follow-up:   Patient with stage IV squamous cell carcinoma of right lateral tongue.  Right partial glossectomy and neck dissection on 05/13/2019.  Radiation and cisplatin 06/27/2019 through 07/25/2019.  03/06/2020 left lung lesion and radiation completed on 04/02/2020.  Patient with progression on keytruda. Starting radiation again.  Met with patient following radiation.  Patient reports pain from tumor is effecting appetite and ability to eat.  Can only open mouth small amount.  Has thrush and on medication.  Has been taking pain medication more frequently and helping.  Likely to start chemotherapy after radiation. Patient drinking mostly 4 boost plus shakes per day sometimes up to 7 per day.  Has soups and other liquids (water).   Medications: reviewed  Labs: reviewed  Anthropometrics:   Weight 124 lb decreased from 131 lb on 1/28.     Estimated Energy Needs  Kcals: 1800-2100 Protein: 90-105 g Fluid: > 1.8 L  NUTRITION DIAGNOSIS: Inadequate oral intake continues   INTERVENTION:  Continue to encourage patient to take pain medications to help with pain and increase ability to drink shakes.  Encouraged patient to increase boost plus shakes consistently to 7 per day to better meet nutritional needs and decrease chance of weight from decreasing.  (2450 calories and 98 g protein) Patient declined complimentary case of ensure today.  Likes boost better and son has purchased them for her.   Did receive handout on savory smoothies.   Contact information provided    MONITORING, EVALUATION, GOAL: weight trends, intake   NEXT VISIT: March 15 phone call  Mianna Iezzi B. Zenia Resides, [place], [place] Registered Dietitian [phone] (mobile)

## 2020-12-05 ENCOUNTER — Other Ambulatory Visit: Payer: Self-pay

## 2020-12-05 ENCOUNTER — Ambulatory Visit: Payer: BC Managed Care – PPO

## 2020-12-05 ENCOUNTER — Ambulatory Visit
Admission: RE | Admit: 2020-12-05 | Discharge: 2020-12-05 | Disposition: A | Discharge status: Home / Self Care | Payer: BC Managed Care – PPO | Source: Ambulatory Visit | Attending: Radiation Oncology | Admitting: Radiation Oncology

## 2020-12-05 DIAGNOSIS — C023 Malignant neoplasm of anterior two-thirds of tongue, part unspecified: Secondary | ICD-10-CM | POA: Diagnosis not present

## 2020-12-06 ENCOUNTER — Encounter: Payer: Self-pay | Admitting: Radiation Oncology

## 2020-12-06 ENCOUNTER — Ambulatory Visit
Admission: RE | Admit: 2020-12-06 | Discharge: 2020-12-06 | Disposition: A | Discharge status: Home / Self Care | Payer: BC Managed Care – PPO | Source: Ambulatory Visit | Attending: Radiation Oncology | Admitting: Radiation Oncology

## 2020-12-06 ENCOUNTER — Ambulatory Visit: Payer: BC Managed Care – PPO

## 2020-12-06 DIAGNOSIS — C023 Malignant neoplasm of anterior two-thirds of tongue, part unspecified: Secondary | ICD-10-CM | POA: Diagnosis not present

## 2020-12-07 ENCOUNTER — Ambulatory Visit: Payer: BC Managed Care – PPO

## 2020-12-12 ENCOUNTER — Other Ambulatory Visit (HOSPITAL_COMMUNITY): Payer: Self-pay | Admitting: Hematology

## 2020-12-14 ENCOUNTER — Inpatient Hospital Stay: Payer: BC Managed Care – PPO

## 2020-12-14 ENCOUNTER — Inpatient Hospital Stay: Payer: BC Managed Care – PPO | Admitting: Oncology

## 2020-12-14 ENCOUNTER — Other Ambulatory Visit: Payer: Self-pay

## 2020-12-14 ENCOUNTER — Other Ambulatory Visit: Payer: Self-pay | Admitting: Radiation Oncology

## 2020-12-14 ENCOUNTER — Inpatient Hospital Stay: Payer: BC Managed Care – PPO | Admitting: Nurse Practitioner

## 2020-12-14 DIAGNOSIS — Z923 Personal history of irradiation: Secondary | ICD-10-CM | POA: Diagnosis not present

## 2020-12-14 DIAGNOSIS — Z79891 Long term (current) use of opiate analgesic: Secondary | ICD-10-CM | POA: Diagnosis not present

## 2020-12-14 DIAGNOSIS — C021 Malignant neoplasm of border of tongue: Secondary | ICD-10-CM

## 2020-12-14 DIAGNOSIS — C770 Secondary and unspecified malignant neoplasm of lymph nodes of head, face and neck: Secondary | ICD-10-CM | POA: Diagnosis not present

## 2020-12-14 DIAGNOSIS — C023 Malignant neoplasm of anterior two-thirds of tongue, part unspecified: Secondary | ICD-10-CM

## 2020-12-14 DIAGNOSIS — G893 Neoplasm related pain (acute) (chronic): Secondary | ICD-10-CM | POA: Diagnosis not present

## 2020-12-14 DIAGNOSIS — Z95828 Presence of other vascular implants and grafts: Secondary | ICD-10-CM

## 2020-12-14 LAB — CBC WITH DIFFERENTIAL (CANCER CENTER ONLY)
Abs Immature Granulocytes: 0.02 10*3/uL (ref 0.00–0.07)
Basophils Absolute: 0 10*3/uL (ref 0.0–0.1)
Basophils Relative: 1 %
Eosinophils Absolute: 0.2 10*3/uL (ref 0.0–0.5)
Eosinophils Relative: 2 %
HCT: 36.7 % (ref 36.0–46.0)
Hemoglobin: 12.1 g/dL (ref 12.0–15.0)
Immature Granulocytes: 0 %
Lymphocytes Relative: 3 %
Lymphs Abs: 0.3 10*3/uL — ABNORMAL LOW (ref 0.7–4.0)
MCH: 28.5 pg (ref 26.0–34.0)
MCHC: 33 g/dL (ref 30.0–36.0)
MCV: 86.6 fL (ref 80.0–100.0)
Monocytes Absolute: 0.8 10*3/uL (ref 0.1–1.0)
Monocytes Relative: 10 %
Neutro Abs: 7 10*3/uL (ref 1.7–7.7)
Neutrophils Relative %: 84 %
Platelet Count: 283 10*3/uL (ref 150–400)
RBC: 4.24 MIL/uL (ref 3.87–5.11)
RDW: 12.2 % (ref 11.5–15.5)
WBC Count: 8.3 10*3/uL (ref 4.0–10.5)
nRBC: 0 % (ref 0.0–0.2)

## 2020-12-14 LAB — CMP (CANCER CENTER ONLY)
ALT: 23 U/L (ref 0–44)
AST: 25 U/L (ref 15–41)
Albumin: 3.1 g/dL — ABNORMAL LOW (ref 3.5–5.0)
Alkaline Phosphatase: 87 U/L (ref 38–126)
Anion gap: 16 — ABNORMAL HIGH (ref 5–15)
BUN: 20 mg/dL (ref 8–23)
CO2: 19 mmol/L — ABNORMAL LOW (ref 22–32)
Calcium: 9.3 mg/dL (ref 8.9–10.3)
Chloride: 98 mmol/L (ref 98–111)
Creatinine: 0.78 mg/dL (ref 0.44–1.00)
GFR, Estimated: >60 mL/min (ref >60)
Glucose, Bld: 83 mg/dL (ref 70–99)
Potassium: 4.6 mmol/L (ref 3.5–5.1)
Sodium: 133 mmol/L — ABNORMAL LOW (ref 135–145)
Total Bilirubin: 0.7 mg/dL (ref 0.3–1.2)
Total Protein: 7.7 g/dL (ref 6.5–8.1)

## 2020-12-14 LAB — TSH: TSH: 9.645 u[IU]/mL — ABNORMAL HIGH (ref 0.308–3.960)

## 2020-12-14 MED ORDER — MORPHINE SULFATE ER 30 MG PO TBCR: 30.0000 mg | EXTENDED_RELEASE_TABLET | Freq: Two times a day (BID) | ORAL | 0 refills | Status: AC

## 2020-12-14 MED ORDER — SODIUM CHLORIDE 0.9% FLUSH
10.0000 mL | Freq: Once | INTRAVENOUS | Status: AC
  Administered 2020-12-14: 10 mL
  Filled 2020-12-14: qty 10

## 2020-12-14 MED ORDER — OXYCODONE HCL 5 MG PO TABS: 5.0000 mg | ORAL_TABLET | ORAL | 0 refills | Status: AC | PRN

## 2020-12-14 MED ORDER — HEPARIN SOD (PORK) LOCK FLUSH 100 UNIT/ML IV SOLN
500.0000 [IU] | Freq: Once | INTRAVENOUS | Status: AC
  Administered 2020-12-14: 500 [IU]
  Filled 2020-12-14: qty 5

## 2020-12-14 MED ORDER — DEXAMETHASONE 2 MG PO TABS: ORAL_TABLET | ORAL | 0 refills | Status: AC

## 2020-12-14 NOTE — Progress Notes (Signed)
[place] OFFICE PROGRESS NOTE   Diagnosis: Head neck cancer  INTERVAL HISTORY:   Terri [name] returns as scheduled.  She is here with her son.  She completed palliative radiation to the right neck/jaw mass in 4 fractions, last treatment 12/06/2020.  She has noted a decrease in the right neck mass.  She continues to have pain.  She remains on MS Contin and oxycodone.  She feels the pain medicine needs to be increased.  Terri [name] developed altered taste following the radiation.  Mycelex has helped the oral candidiasis, but she would like a refill on Diflucan.  Objective:  Vital signs in last 24 hours:  Blood pressure 124/77, pulse 100, temperature 97.8 F (36.6 C), temperature source Tympanic, resp. rate 20, height '5\' 5"'  (1.651 m), weight 116 lb 3.2 oz (52.7 kg), SpO2 100 %.    HEENT: Trismus, difficult to visualize the oral cavity, no obvious ulcers.  The fungating right neck mass has diminished in size with a necrotic ulcer base in the right submandibular region.  Persistent induration at the right mandible Resp: Lungs clear bilaterally Cardio: Regular rate and rhythm GI: No hepatomegaly Vascular: No leg edema  Portacath/PICC-without erythema  Lab Results:  Lab Results  Component Value Date   WBC 8.3 12/14/2020   HGB 12.1 12/14/2020   HCT 36.7 12/14/2020   MCV 86.6 12/14/2020   PLT 283 12/14/2020   NEUTROABS 7.0 12/14/2020    CMP  Lab Results  Component Value Date   NA 135 11/30/2020   K 4.5 11/30/2020   CL 102 11/30/2020   CO2 23 11/30/2020   GLUCOSE 96 11/30/2020   BUN 19 11/30/2020   CREATININE 0.76 11/30/2020   CALCIUM 9.5 11/30/2020   PROT 7.6 11/30/2020   ALBUMIN 3.4 (L) 11/30/2020   AST 23 11/30/2020   ALT 27 11/30/2020   ALKPHOS 78 11/30/2020   BILITOT 0.4 11/30/2020   GFRNONAA >60 11/30/2020   GFRAA >60 05/21/2020   Medications: I have reviewed the patient's current medications.   Assessment/Plan: 1.Stage IVb (T4AN3B) poorly  differentiated squamous cell carcinoma of the right lateral tongue: -Right partial glossectomy and neck dissection on 05/13/2019 by Dr. Nicolette Bang -Pathology showed 4.2 cm tumor with margins negative, 17 mm depth of invasion, moderately to poorly differentiated, positive neural invasion, 2/17 lymph nodes positive, ECE positive, PT 4 APN 3B.PD-L1 CPS greater than 10 -IMRT with weekly cisplatin from 06/27/2019 through 07/25/2019. -PET scan on 11/04/2019 showed hypermetabolic level 1B lymph node in the right submental region measuring 5 mm. Left upper lobe pulmonary nodule measuring 8 mm. -PET scan on 02/13/2020 with ill-defined 9 mm short axis level 1B node with SUV 8.6, previously 5.6 SUV. 2.1 cm short axis superior mediastinal/prevascular node previously 8 mm SUV 13.2. -Bronchoscopy on 03/06/2020 with biopsy of the left lung lesion consistent with squamous cell carcinoma. Right lung lesion shows atypical cells. -XRT to the right and left lung lesions completed on 04/02/2020 -She met with Dr. Lorrene [facility] at [facility] who offered surgical resection. Patient was reluctant to consider it given morbidity associated with it. -PET scan on 08/27/2020 showed 19 mm short axis right submandibular node, maximum SUV 12.0, previously 11.5. PET scan on 02/13/2020 showed SUV of 7.32, with lymph node measuring 9 mm in short axis. Mild hypermetabolism along the right lateral chest wall, SUV 4.2. 7 mm short axis anterior mediastinal/prevascular lymph node, maximum SUV 4.1. -CT neck 09/18/2020-increase in right submandibular metastatic lymph node extending into the right sublingual  region, postsurgical changes from right neck dissection -Cycle 1 pembrolizumab 09/19/2020 -Cycle 2 Pembrolizumab 10/12/2020 -Cycle 3 Pembrolizumab 11/02/2020 -Neck CT 11/29/2020-large area of necrotic tumor in the region of the right tongue base progressed significantly since the prior study, it measures approximately 3.6 x 4.0 cm.  There is  mass-effect on the airway which remains patent.  Large area of soft tissue mass in the left lateral tongue, uncertain etiology.  Shows diffuse increase metabolism on PET and could be inflammatory or tumor.  This area measures approximately 5.3 x 2.3 cm.  This has progressed since the prior CT.  Large necrotic submandibular lymph node mass on the right.  Ulcerated to the skin surface and shows marked progression from the prior CT.  Encases the right submandibular gland.  Measures approximately 6 x 7.8 cm.  Erosive changes of the inferior margin of the body of the mandible on the right compatible with tumor invasion.  This has progressed since the prior CT. -Palliative radiation 12/04/2020-12/06/2020, 4 fractions  2.Pain secondary to the right submandibular mass-improved 11/02/2020.  3.  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  The mass has decreased significantly in size.  There appears to be necrosis at the base of the mass.  She continues to have significant pain at the right jaw.  We adjusted narcotic regimen today.  MS Contin will be increased to 30 mg twice daily.  She will continue oxycodone (5-10 mg) as needed for breakthrough pain.  Dr. Isidore Moos evaluated Terri [name] today.  She will make a referral to the wound care clinic.  The plan is to begin systemic therapy with Taxol/carboplatin if the tumor does not continue to respond to the radiation or if there is disease progression.  We reviewed potential toxicities associated with the Taxol/carboplatin regimen including the chance for nausea/vomiting, alopecia, and hematologic toxicity.  We discussed the allergic reaction, neuropathy, and bone pain associated with Taxol.  We discussed the allergic reaction seen with carboplatin.  We discussed the potential need for G-CSF support.  We reviewed toxicities associated with G-CSF including bone  pain, rash, and splenic rupture.  Terri [name] will return for an office visit in 2 weeks.  We will hold on beginning systemic therapy if the tumor continues to respond to radiation.  Betsy Coder, MD  12/14/2020  8:18 AM

## 2020-12-14 NOTE — Progress Notes (Signed)
DISCONTINUE ON PATHWAY REGIMEN - Head and Neck     A cycle is every 21 days:     Pembrolizumab   **Always confirm dose/schedule in your pharmacy ordering system**  REASON: Disease Progression PRIOR TREATMENT: [id number]: Pembrolizumab 200 mg q21 Days for up to 24 Months TREATMENT RESPONSE: Progressive Disease (PD)  START OFF PATHWAY REGIMEN - Head and Neck   [id number]:Carboplatin + Paclitaxel (5/200) q21d:   A cycle is every 21 days:     Paclitaxel      Carboplatin   **Always confirm dose/schedule in your pharmacy ordering system**  Patient Characteristics: Oral Cavity, Metastatic, Second Line Disease Classification: Oral Cavity AJCC T Category: T4a AJCC M Category: M1 AJCC N Category: cN3b AJCC 8 Stage Grouping: IVC Therapeutic Status: Metastatic Disease Line of Therapy: Second Line  Intent of Therapy: Non-Curative / Palliative Intent, Discussed with Patient

## 2020-12-14 NOTE — Patient Instructions (Signed)
Implanted Port Insertion, Care After This sheet gives you information about how to care for yourself after your procedure. Your health care provider may also give you more specific instructions. If you have problems or questions, contact your health care provider. What can I expect after the procedure? After the procedure, it is common to have:  Discomfort at the port insertion site.  Bruising on the skin over the port. This should improve over 3-4 days. Follow these instructions at home: Port care  After your port is placed, you will get a manufacturer's information card. The card has information about your port. Keep this card with you at all times.  Take care of the port as told by your health care provider. Ask your health care provider if you or a family member can get training for taking care of the port at home. A home health care nurse may also take care of the port.  Make sure to remember what type of port you have. Incision care  Follow instructions from your health care provider about how to take care of your port insertion site. Make sure you: ? Wash your hands with soap and water before and after you change your bandage (dressing). If soap and water are not available, use hand sanitizer. ? Change your dressing as told by your health care provider. ? Leave stitches (sutures), skin glue, or adhesive strips in place. These skin closures may need to stay in place for 2 weeks or longer. If adhesive strip edges start to loosen and curl up, you may trim the loose edges. Do not remove adhesive strips completely unless your health care provider tells you to do that.  Check your port insertion site every day for signs of infection. Check for: ? Redness, swelling, or pain. ? Fluid or blood. ? Warmth. ? Pus or a bad smell.      Activity  Return to your normal activities as told by your health care provider. Ask your health care provider what activities are safe for you.  Do not  lift anything that is heavier than 10 lb (4.5 kg), or the limit that you are told, until your health care provider says that it is safe. General instructions  Take over-the-counter and prescription medicines only as told by your health care provider.  Do not take baths, swim, or use a hot tub until your health care provider approves. Ask your health care provider if you may take showers. You may only be allowed to take sponge baths.  Do not drive for 24 hours if you were given a sedative during your procedure.  Wear a medical alert bracelet in case of an emergency. This will tell any health care providers that you have a port.  Keep all follow-up visits as told by your health care provider. This is important. Contact a health care provider if:  You cannot flush your port with saline as directed, or you cannot draw blood from the port.  You have a fever or chills.  You have redness, swelling, or pain around your port insertion site.  You have fluid or blood coming from your port insertion site.  Your port insertion site feels warm to the touch.  You have pus or a bad smell coming from the port insertion site. Get help right away if:  You have chest pain or shortness of breath.  You have bleeding from your port that you cannot control. Summary  Take care of the port as told by your   health care provider. Keep the manufacturer's information card with you at all times.  Change your dressing as told by your health care provider.  Contact a health care provider if you have a fever or chills or if you have redness, swelling, or pain around your port insertion site.  Keep all follow-up visits as told by your health care provider. This information is not intended to replace advice given to you by your health care provider. Make sure you discuss any questions you have with your health care provider. Document Revised: 04/20/2018 Document Reviewed: 04/20/2018 Elsevier Patient Education   2021 Elsevier Inc.  

## 2020-12-14 NOTE — Progress Notes (Signed)
I walked upstairs at the request of Dr Benay Spice today to check on Terri [name]. Per his report, her tumor has already starting to erode/shink from radiation.  I evaluated Terri [name] and spoke with her and her son.  The tumor at her jaw has regressed to some extent and the pain  there is better.  She does report some tongue pain that has worsened as well as more dysgeusia.  I suspect this is a temporary effect of radiation as we also treated her tongue with palliative intent.  I recommend that she be referred to wound care and I made this referral with an urgent request and comments "Does NOT need hyperbaric but DOES need wound care for tumor in jaw.  Large fungating tumor is regressing from RT, is necrotic, concerns for risk of mandible exposure if it continues to shrink.  May need escalation of antibiotics.  Thanks"  I will evaluate her later this month.  We may hold off on any chemotherapy add'l repeat radiation to allow the wound to stabilize somewhat.  Dr. Benay Spice and I spoke about holding chemotherapy and repeating radiation at the appropriate time since this is radiosensitive and chemotherapy may not be necessary given that her systemic disease is currently controlled.  No charge for visit.   Tumor before competing RT:     Tumor today:   -----------------------------------  Eppie Gibson, MD

## 2020-12-14 NOTE — Progress Notes (Signed)
Provided patient printed information on Taxol and Carboplatin and explained how to take Decadron before 1st treatment.

## 2020-12-17 ENCOUNTER — Telehealth: Payer: Self-pay | Admitting: Oncology

## 2020-12-17 ENCOUNTER — Telehealth: Payer: Self-pay | Admitting: *Deleted

## 2020-12-17 NOTE — Telephone Encounter (Signed)
Scheduled appointment per 3/11 los. Spoke to patient who is aware of appointment date and time.

## 2020-12-17 NOTE — Telephone Encounter (Signed)
CALLED PATIENT TO INFORM OF HIS MOM'S FU BEING MOVED TO 12-31-20 @ 2 PM WITH DR. Isidore Moos, LVM FOR A RETURN CALL

## 2020-12-18 ENCOUNTER — Inpatient Hospital Stay: Payer: BC Managed Care – PPO

## 2020-12-18 NOTE — Progress Notes (Addendum)
Nutrition Follow-up:  Patient with stage IV squamous cell carcinoma of right lateral tongue.  Right partial glossectomy and neck dissection on 8/7 2020.  Radiation and cisplatin 06/27/2019 -07/25/2019.  03/07/2020 left lung lesion and radiation completed on 04/02/2020.  Patient with progression on keytruda.  Radiation completed on tumor at her jaw. Noted referred to wound clinic.    Spoke with patient via phone for nutrition follow-up.  Patient reports that her appetite is decreased from side effects of treatment and having pain.  Reports drinking about 3-4 boost plus shakes (1080-1440 calories and 42-56 g protein) and eating some soups    Medications: reviewed  Labs: reviewed  Anthropometrics:   Weight 116 lb on 3/11  124 lb on 3/1 131 lb on 1/28 137 lb 09/19/2020  15% weight loss in the last 3 and half months, significant  Estimated Energy Needs  Kcals: 1800-2100 Protein: 90-105 g Fluid: > 1.8 L  NUTRITION DIAGNOSIS: Inadequate oral intake continues  MALNUTRITION DIAGNOSIS: Patient meets criteria for severe malnutrition in chronic illness as evidenced by 15% weight loss in 3 1/2 months and eating < or equal to 75% of estimated energy needs for > 1 month.    INTERVENTION:  Concerned about continued weight loss, decrease ability to take oral nutrition from cancer and side effects from radiation. If aggressive treatment is still recommended would consider placement of feeding tube.  Message sent to providers.   Encouraged patient to continue drinking boost plus shakes (6-7 per day would better meet nutritional needs) Patient has contact information     MONITORING, EVALUATION, GOAL: weight trends, intake   NEXT VISIT: Monday March 28 f/u with Jerrell Belfast B. Zenia Resides, [place], [place] Registered Dietitian [phone] (mobile)

## 2020-12-20 ENCOUNTER — Telehealth: Payer: Self-pay | Admitting: *Deleted

## 2020-12-20 NOTE — Telephone Encounter (Signed)
CoverMyMeds KEY: [id number] for Morphine Sulfate ER 30mg  tab response "Resolved. No further PA activitty required".

## 2020-12-21 ENCOUNTER — Ambulatory Visit: Payer: Self-pay | Admitting: Radiation Oncology

## 2020-12-21 ENCOUNTER — Other Ambulatory Visit: Payer: Self-pay

## 2020-12-21 ENCOUNTER — Telehealth: Payer: Self-pay | Admitting: *Deleted

## 2020-12-21 DIAGNOSIS — C023 Malignant neoplasm of anterior two-thirds of tongue, part unspecified: Secondary | ICD-10-CM

## 2020-12-21 NOTE — Telephone Encounter (Signed)
[place] @ [facility] THIS APPT. FOR THIS PATIENT, SPOKE WITH SHANA AND SHE WILL GET HER SCHEDULED

## 2020-12-31 ENCOUNTER — Ambulatory Visit
Admission: RE | Admit: 2020-12-31 | Discharge: 2020-12-31 | Disposition: A | Discharge status: Home / Self Care | Payer: BC Managed Care – PPO | Source: Ambulatory Visit | Attending: Radiation Oncology | Admitting: Radiation Oncology

## 2020-12-31 ENCOUNTER — Other Ambulatory Visit: Payer: Self-pay

## 2020-12-31 ENCOUNTER — Inpatient Hospital Stay (HOSPITAL_BASED_OUTPATIENT_CLINIC_OR_DEPARTMENT_OTHER): Payer: BC Managed Care – PPO | Admitting: Oncology

## 2020-12-31 ENCOUNTER — Telehealth: Payer: Self-pay | Admitting: Oncology

## 2020-12-31 ENCOUNTER — Inpatient Hospital Stay: Payer: BC Managed Care – PPO | Admitting: Nutrition

## 2020-12-31 VITALS — BP 121/84 | HR 100 | Temp 97.6°F | Resp 20 | Ht 65.0 in | Wt 112.8 lb

## 2020-12-31 VITALS — BP 123/74 | HR 96 | Temp 97.8°F | Resp 13 | Ht 65.0 in | Wt 113.1 lb

## 2020-12-31 DIAGNOSIS — I10 Essential (primary) hypertension: Secondary | ICD-10-CM | POA: Insufficient documentation

## 2020-12-31 DIAGNOSIS — C770 Secondary and unspecified malignant neoplasm of lymph nodes of head, face and neck: Secondary | ICD-10-CM | POA: Insufficient documentation

## 2020-12-31 DIAGNOSIS — K219 Gastro-esophageal reflux disease without esophagitis: Secondary | ICD-10-CM | POA: Diagnosis not present

## 2020-12-31 DIAGNOSIS — M129 Arthropathy, unspecified: Secondary | ICD-10-CM | POA: Insufficient documentation

## 2020-12-31 DIAGNOSIS — C023 Malignant neoplasm of anterior two-thirds of tongue, part unspecified: Secondary | ICD-10-CM | POA: Diagnosis present

## 2020-12-31 DIAGNOSIS — Z923 Personal history of irradiation: Secondary | ICD-10-CM | POA: Diagnosis not present

## 2020-12-31 DIAGNOSIS — C021 Malignant neoplasm of border of tongue: Secondary | ICD-10-CM

## 2020-12-31 DIAGNOSIS — Z79899 Other long term (current) drug therapy: Secondary | ICD-10-CM | POA: Diagnosis not present

## 2020-12-31 DIAGNOSIS — I447 Left bundle-branch block, unspecified: Secondary | ICD-10-CM | POA: Insufficient documentation

## 2020-12-31 DIAGNOSIS — R252 Cramp and spasm: Secondary | ICD-10-CM | POA: Insufficient documentation

## 2020-12-31 NOTE — Telephone Encounter (Signed)
Scheduled appt per 3/28 los  - mailed letter with appt date and time

## 2020-12-31 NOTE — Progress Notes (Signed)
Nutrition follow-up completed with patient and son.  Patient is status post treatment for stage IV squamous cell carcinoma of the right lateral tongue.  Patient was noted for progression and referral to the wound clinic.    Patient's weight has decreased again and was documented as 112 pounds March 28 decreased from 116 pounds March 11.  Patient reports she knows that she needs to eat and drink more.  She is tolerating some very soft foods but mostly liquids.  Reports drinking about 4 boost plus a day.  States she does not want to have a feeding tube and she knows she needs to try harder to improve nutrition.  Revised estimated energy needs: 2040-2295 cal, 90-105 g protein, greater than 2 L fluid.  Nutrition diagnosis: Inadequate oral intake continues. Severe malnutrition continues.  Intervention: Provided support and encouragement for patient to try to spread boost plus out throughout the day and consume 6-7 cartons daily. Recommended 500 mg vitamin C, 200 mg zinc sulfate and multivitamin to aid in healing. Recommended patient discontinue zinc sulfate after 2 weeks in order to avoid copper deficiency. Provided written instructions.  Questions were answered.  Teach back method used.  Patient has RD contact information.  Monitoring, evaluation, goals: Patient will work to increase calories and protein to minimize weight loss and support healing.  Next follow-up to be scheduled in approximately 1 month.  **Disclaimer: This note was dictated with voice recognition software. Similar sounding words can inadvertently be transcribed and this note may contain transcription errors which may not have been corrected upon publication of note.**

## 2020-12-31 NOTE — Progress Notes (Signed)
Terri [name] presents today for follow-up after completing radiation to submandibular mass on 12/06/2020  Pain issues, if any: Reports mild improvement to discomfort to neck/jaw Using a feeding tube?: N/A Weight changes, if any:  Wt Readings from Last 3 Encounters:  12/31/20 112 lb 12.8 oz (51.2 kg)  12/14/20 116 lb 3.2 oz (52.7 kg)  11/30/20 124 lb 6 oz (56.4 kg)   Swallowing issues, if any: No changes. Continues to consume mainly softer foods or liquid supplements Smoking or chewing tobacco? N/A Using fluoride trays daily? N/A Last ENT visit was on: Not since last year; 06/13/2020 saw Dr. Francina Ames  Other notable issues, if any: Continues to deal with lingering fatigue. Denies any ear pain or difficulty opening her mouth fully. Denies any itching or irritation to treatment area. Has F/U with Dr. Benay Spice later this afternoon. Will start hyperbaric oxygen therapy on 01/07/2021  Vitals:   12/31/20 1416  BP: 121/84  Pulse: 100  Resp: 20  Temp: 97.6 F (36.4 C)  SpO2: 100%

## 2020-12-31 NOTE — Progress Notes (Signed)
[facility] OFFICE PROGRESS NOTE   Diagnosis: Head neck cancer  INTERVAL HISTORY:   Terri [name] returns as scheduled.  She saw Dr. Isidore [name] earlier today.  She continues to have pain at the right mandible/submandibular area.  She takes approximately 4-6 oxycodone tablets per day in addition to MS Contin.  She has not started the 30 mg MS Contin yet.  There was a delay in insurance approval.  Terri [name] has improved.  She has been referred to the wound clinic and is scheduled for an appointment on 01/07/2021.  Objective:  Vital signs in last 24 hours:  Blood pressure 123/74, pulse 96, temperature 97.8 F (36.6 C), temperature source Tympanic, resp. rate 13, height '5\' 5"'  (1.651 m), weight 113 lb 1.6 oz (51.3 kg), SpO2 100 %.    HEENT: Ulcerated lesion in the right submandibular region with surrounding induration.  Trismus.  Mild white exudate over the tongue and buccal mucosa Lymphatics: No cervical or supraclavicular nodes.  "Shotty "left axillary node Resp: Lungs clear bilaterally Cardio: Regular rate and rhythm GI: No hepatosplenomegaly, nontender Vascular: No leg edema   Lab Results:  Lab Results  Component Value Date   WBC 8.3 12/14/2020   HGB 12.1 12/14/2020   HCT 36.7 12/14/2020   MCV 86.6 12/14/2020   PLT 283 12/14/2020   NEUTROABS 7.0 12/14/2020    CMP  Lab Results  Component Value Date   NA 133 (L) 12/14/2020   K 4.6 12/14/2020   CL 98 12/14/2020   CO2 19 (L) 12/14/2020   GLUCOSE 83 12/14/2020   BUN 20 12/14/2020   CREATININE 0.78 12/14/2020   CALCIUM 9.3 12/14/2020   PROT 7.7 12/14/2020   ALBUMIN 3.1 (L) 12/14/2020   AST 25 12/14/2020   ALT 23 12/14/2020   ALKPHOS 87 12/14/2020   BILITOT 0.7 12/14/2020   GFRNONAA >60 12/14/2020   GFRAA >60 05/21/2020    Medications: I have reviewed the patient's current medications.   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 Nicolette [name] -Pathology showed 4.2 cm tumor with margins negative, 17 mm depth of invasion, moderately to poorly differentiated, positive neural invasion, 2/17 lymph nodes positive, ECE positive, PT 4 APN 3B.PD-L1 CPS greater than 10 -IMRT with weekly cisplatin from 06/27/2019 through 07/25/2019. -PET scan on 11/04/2019 showed hypermetabolic level 1B lymph node in the right submental region measuring 5 mm. Left upper lobe pulmonary nodule measuring 8 mm. -PET scan on 02/13/2020 with ill-defined 9 mm short axis level 1B node with SUV 8.6, previously 5.6 SUV. 2.1 cm short axis superior mediastinal/prevascular node previously 8 mm SUV 13.2. -Bronchoscopy on 03/06/2020 with biopsy of the left lung lesion consistent with squamous cell carcinoma. Right lung lesion shows atypical cells. -XRT to the right and left lung lesions completed on 04/02/2020 -She met with Dr. Lorrene [name] at [facility] who offered surgical resection. Patient was reluctant to consider it given morbidity associated with it. -PET scan on 08/27/2020 showed 19 mm short axis right submandibular node, maximum SUV 12.0, previously 11.5. PET scan on 02/13/2020 showed SUV of 7.32, with lymph node measuring 9 mm in short axis. Mild hypermetabolism along the right lateral chest wall, SUV 4.2. 7 mm short axis anterior mediastinal/prevascular lymph node, maximum SUV 4.1. -CT neck 09/18/2020-increase in right submandibular metastatic lymph node extending into the right sublingual region, postsurgical changes from right neck dissection -Cycle 1 pembrolizumab 09/19/2020 -  Cycle 2 Pembrolizumab 10/12/2020 -Cycle 3 Pembrolizumab 11/02/2020 -Neck CT 11/29/2020-large area of necrotic tumor in the region of the right tongue base progressed significantly since the prior study, it measures approximately 3.6 x 4.0 cm.  There is mass-effect on the airway which remains patent.  Large area of soft tissue  mass in the left lateral tongue, uncertain etiology.  Shows diffuse increase metabolism on PET and could be inflammatory or tumor.  This area measures approximately 5.3 x 2.3 cm.  This has progressed since the prior CT.  Large necrotic submandibular lymph node mass on the right.  Ulcerated to the skin surface and shows marked progression from the prior CT.  Encases the right submandibular gland.  Measures approximately 6 x 7.8 cm.  Erosive changes of the inferior margin of the body of the mandible on the right compatible with tumor invasion.  This has progressed since the prior CT. -CT chest 11/29/2020-decrease in peripheral right upper lobe and medial left upper lobe nodules compared to 02/27/2020 and stable from 08/27/2020, no evidence of disease progression -Palliative radiation 12/04/2020-12/06/2020, 4 fractions  2.Pain secondary to the right submandibular mass-improved 11/02/2020.  3.  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  The right submandibular wound appears to be slightly improved compared to when she was here on 12/14/2020.  I discussed the case with Dr. Isidore [name] earlier this month.  We decided to hold on chemotherapy for now given the apparent response to radiation and lack of systemic disease progression.  She has been referred to the wound clinic.  We will consider beginning Taxol/carboplatin if there is clinical evidence of disease progression.  Terri [name] will continue MS Contin (30 mg) and oxycodone as needed for pain.  We will check a chemistry panel in approximately 2 weeks to follow-up on the hypercalcemia.  She met with a nutritionist today.  She does not wish to have a feeding tube placed.  She will work on increasing her calorie intake.  Terri Coder, MD  12/31/2020  3:54 PM

## 2021-01-01 ENCOUNTER — Encounter: Payer: Self-pay | Admitting: Radiation Oncology

## 2021-01-01 NOTE — Progress Notes (Signed)
Radiation Oncology         ([phone]) [phone] ________________________________  Follow-up note  Outpatient, in person  Name: Terri [name] MRN: [id number]  Date: 12/31/2020  DOB: [birth year]  WU:[id number], Morley Kos, DO  [name], DO   REFERRING PHYSICIAN: [name], DO  DIAGNOSIS:    ICD-10-CM   1. 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) Staging form: Oral Cavity, AJCC 8th Edition - Clinical: Stage IVB (cT4a, cN3b, cM0) - Signed by Derek Jack, MD on 05/30/2019 Histologic grade (G): G3 Histologic grading system: 3 grade system Presence of extranodal extension: Present Perineural invasion (PNI): Present ECOG performance status: Grade 0 P16 overexpression: Not assessed Human papillomavirus (HPV) status: Unknown - Pathologic stage from 06/07/2019: Stage IVB (pT4a, pN3b, cM0) - Signed by Eppie Gibson, MD on 06/07/2019 Stage prefix: Initial diagnosis Histologic grade (G): G3 Histologic grading system: 3 grade system  Now stage IV progressive, recurrent disease Distant metastatic disease is satisfactorily controlled but local regional disease is progressive.  CHIEF COMPLAINT: Here to discuss management of tongue cancer, progressive  HISTORY OF PRESENT ILLNESS::Terri [name] is a 70 y.o. female who presents today for follow-up after completing radiation (palliative quad shot regimen) to submandibular mass on 12/06/2020  Pain issues, if any: Reports mild improvement to discomfort to neck/jaw Using a feeding tube?: N/A Weight changes, if any:  Wt Readings from Last 3 Encounters:  12/31/20 112 lb 12.8 oz (51.2 kg)  12/14/20 116 lb 3.2 oz (52.7 kg)  11/30/20 124 lb 6 oz (56.4 kg)   Swallowing issues, if any: No changes. Continues to consume mainly softer foods or liquid supplements Smoking or chewing tobacco? N/A Using fluoride trays daily?  N/A Last ENT visit was on: Not since last year; 06/13/2020 saw Dr. Francina Ames  Other notable issues, if any: Continues to deal with lingering fatigue. Denies any ear pain or difficulty opening her mouth fully. Denies any itching or irritation to treatment area. Has F/U with Dr. Benay Spice later this afternoon. Will have consultation with wound care clinic on 01/07/2021  Vitals:   12/31/20 1416  BP: 121/84  Pulse: 100  Resp: 20  Temp: 97.6 F (36.4 C)  SpO2: 100%   PAST MEDICAL HISTORY:  has a past medical history of Arthritis, Cancer ([place]), GERD (gastroesophageal reflux disease), History of radiation therapy (06/27/19- 08/05/19), Hypertension, LBBB (left bundle branch block), Port-A-Cath in place (06/14/2019), and Wears glasses.    PAST SURGICAL HISTORY: Past Surgical History:  Procedure Laterality Date  . BRONCHIAL BIOPSY  03/06/2020   Procedure: BRONCHIAL BIOPSIES;  Surgeon: Collene Gobble, MD;  Location: [facility] ENDOSCOPY;  Service: Pulmonary;;  . BRONCHIAL BRUSHINGS  03/06/2020   Procedure: BRONCHIAL BRUSHINGS;  Surgeon: Collene Gobble, MD;  Location: [facility] ENDOSCOPY;  Service: Pulmonary;;  . BRONCHIAL NEEDLE ASPIRATION BIOPSY  03/06/2020   Procedure: BRONCHIAL NEEDLE ASPIRATION BIOPSIES;  Surgeon: Collene Gobble, MD;  Location: [place] [facility];  Service: Pulmonary;;  . BRONCHIAL WASHINGS  03/06/2020   Procedure: BRONCHIAL WASHINGS;  Surgeon: Collene Gobble, MD;  Location: [facility] ENDOSCOPY;  Service: Pulmonary;;  . COLONOSCOPY W/ BIOPSIES AND POLYPECTOMY    . 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 Dr. Nicolette [facility]  . PORTACATH PLACEMENT Left 06/10/2019   Procedure: INSERTION PORT-A-CATH;  Surgeon: Aviva Signs, MD;  Location: [facility] ORS;  Service: General;  Laterality: Left;  pt knows to arrive at 6:15  . TUBAL LIGATION    . VIDEO BRONCHOSCOPY WITH ENDOBRONCHIAL NAVIGATION N/A 03/06/2020   Procedure: VIDEO  BRONCHOSCOPY WITH ENDOBRONCHIAL NAVIGATION;  Surgeon: Collene Gobble, MD;  Location: [facility] ENDOSCOPY;  Service: Pulmonary;  Laterality: N/A;    FAMILY HISTORY: family history includes Heart failure in her father and mother.  SOCIAL HISTORY:  reports that she has never smoked. She has never used smokeless tobacco. She reports that she does not drink alcohol and does not use drugs.  ALLERGIES: Patient has no known allergies.  MEDICATIONS:  Current Outpatient Medications  Medication Sig Dispense Refill  . acetaminophen (TYLENOL) 650 MG CR tablet Take 1,300 mg by mouth every 8 (eight) hours as needed for pain.    . clotrimazole (MYCELEX) 10 MG troche Take 1 tablet (10 mg total) by mouth in the morning, at noon, in the evening, and at bedtime. 120 Troche 1  . DENTA 5000 PLUS 1.1 % CREA dental cream Place 1 application onto teeth at bedtime.     [name] dexamethasone (DECADRON) 2 MG tablet Take #5 (10mg ) night before 1st Taxol treatment and take #5 (10mg ) at 6 am day of 1st Taxol (Patient not taking: Reported on 12/31/2020) 10 tablet 0  . Docusate Sodium (COLACE PO) Take 1 capsule by mouth daily as needed.    . lidocaine-prilocaine (EMLA) cream Apply 1 application topically as directed. Apply to port site 1-2 hours prior to stick and cover w/plastic wrap 30 g 1  . morphine (MS CONTIN) 30 MG 12 hr tablet Take 1 tablet (30 mg total) by mouth every 12 (twelve) hours. (Patient not taking: Reported on 12/31/2020) 60 tablet 0  . oxyCODONE (OXY IR/ROXICODONE) 5 MG immediate release tablet Take 1-2 tablets (5-10 mg total) by mouth every 4 (four) hours as needed. 120 tablet 0  . Polyethylene Glycol 3350 (MIRALAX PO) Take 17 g by mouth daily as needed.    . Probiotic CAPS Take 1 capsule by mouth daily.     No current facility-administered medications for this encounter.   Facility-Administered Medications Ordered in Other Encounters  Medication Dose Route Frequency Provider Last Rate Last Admin  . heparin lock  flush 100 unit/mL  500 Units Intracatheter Once PRN Derek Jack, MD      . sodium chloride 0.9 % 1,000 mL with potassium chloride 20 mEq, magnesium sulfate 2 g infusion   Intravenous Once Derek Jack, MD      . sodium chloride flush (NS) 0.9 % injection 10 mL  10 mL Intracatheter Once PRN Derek Jack, MD        REVIEW OF SYSTEMS:  Notable for that above.   PHYSICAL EXAM:  height is 5\' 5"  (1.651 m) and weight is 112 lb 12.8 oz (51.2 kg). Her temperature is 97.6 F (36.4 C). Her blood pressure is 121/84 and her pulse is 100. Her respiration is 20 and oxygen saturation is 100%.   General: Alert and oriented, in no acute distress ; very thin HEENT: She has severe trismus.   She no longer has a white coating in her mouth.  I cannot examine the tongue very well due to the trismus but the mucosa appears more normal than it did in her past 2 appointments when there seem to be an exophytic tumor arising from it  Neck: There is a fungating necrotic mass arising from the right submandibular region, which continues to be malodorous; several  centimeters in size; increasing central necrosis with milky discharge-see serial photographs below   Tumor before competing palliative RT, 12/06/20:     Tumor 12-14-20:   Tumor today, 12-31-20   ECOG = 1  0 - Asymptomatic (Fully active, able to carry on all predisease activities without restriction)  1 - Symptomatic but completely ambulatory (Restricted in physically strenuous activity but ambulatory and able to carry out work of a light or sedentary nature. For example, light housework, office work)  2 - Symptomatic, <50% in bed during the day (Ambulatory and capable of all self care but unable to carry out any work activities. Up and about more than 50% of waking hours)  3 - Symptomatic, >50% in bed, but not bedbound (Capable of only limited self-care, confined to bed or chair 50% or more of waking hours)  4 - Bedbound (Completely  disabled. Cannot carry on any self-care. Totally confined to bed or chair)  5 - Death   Eustace Pen MM, Creech RH, Tormey DC, et al. [phone]). "Toxicity and response criteria of the [facility]". [place] Oncol. 5 (6): 649-55   LABORATORY DATA:  Lab Results  Component Value Date   WBC 8.3 12/14/2020   HGB 12.1 12/14/2020   HCT 36.7 12/14/2020   MCV 86.6 12/14/2020   PLT 283 12/14/2020   CMP     Component Value Date/Time   NA 133 (L) 12/14/2020 0807   K 4.6 12/14/2020 0807   CL 98 12/14/2020 0807   CO2 19 (L) 12/14/2020 0807   GLUCOSE 83 12/14/2020 0807   BUN 20 12/14/2020 0807   CREATININE 0.78 12/14/2020 0807   CALCIUM 9.3 12/14/2020 0807   PROT 7.7 12/14/2020 0807   ALBUMIN 3.1 (L) 12/14/2020 0807   AST 25 12/14/2020 0807   ALT 23 12/14/2020 0807   ALKPHOS 87 12/14/2020 0807   BILITOT 0.7 12/14/2020 0807   GFRNONAA >60 12/14/2020 0807   GFRAA >60 05/21/2020 1520         RADIOGRAPHY: No results found.    IMPRESSION/PLAN: Tongue cancer, metastatic, with progressive submandibular mass, status post palliative reirradiation  I had a lengthy discussion with the patient as well as with her son in person today   She has not yet seen wound care clinic.  Her initial consultation is on April 4.  There still is an odor from the tumor and I suspect she may need to go on additional antibiotics.  There is a concern that as the tumor continues to  necrose centrally that her mandible may become exposed.  Any help with wound care re: healing will be much appreciated.  We can certainly repeat the quad shot regimen to gain additional control of the tumor and promote tumor cell death, but this is a balancing act given the risk tumor regression of mandibular exposure.  I will try to get in touch with the wound care clinic before her consultation but I also gave my contact information to the patient's son so that he can ask for the wound care provider to talk to me about  strategies moving forward.  I also explained that her nutrition is very compromised and this is affecting her overall prognosis as well as her wound healing prospects.  She has been following with nutritionists and sees a nutritionist today.  I urged her to get a temporary feeding tube since she is struggling to get the calories and protein that she needs.  At this time she declines a  feeding tube and she states that she will push her calories and protein more than previously.  I will see her back in early May, sooner if needed.  Hope to connect with her wound care provider in the next week to coordinate strategies and care.   On date of service, in total, I spent 30 minutes on this encounter. Patient was seen in person.   __________________________________________   Eppie Gibson, MD  This document serves as a record of services personally performed by Eppie Gibson, MD. It was created on his behalf by Clerance Lav, a trained medical scribe. The creation of this record is based on the scribe's personal observations and the provider's statements to them. This document has been checked and approved by the attending provider.

## 2021-01-07 ENCOUNTER — Encounter (HOSPITAL_BASED_OUTPATIENT_CLINIC_OR_DEPARTMENT_OTHER): Payer: BC Managed Care – PPO | Attending: Internal Medicine | Admitting: Internal Medicine

## 2021-01-07 ENCOUNTER — Other Ambulatory Visit: Payer: Self-pay

## 2021-01-07 DIAGNOSIS — I10 Essential (primary) hypertension: Secondary | ICD-10-CM | POA: Diagnosis not present

## 2021-01-07 DIAGNOSIS — C7989 Secondary malignant neoplasm of other specified sites: Secondary | ICD-10-CM | POA: Diagnosis not present

## 2021-01-07 DIAGNOSIS — L98494 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: Secondary | ICD-10-CM | POA: Insufficient documentation

## 2021-01-07 DIAGNOSIS — C01 Malignant neoplasm of base of tongue: Secondary | ICD-10-CM | POA: Insufficient documentation

## 2021-01-08 ENCOUNTER — Telehealth: Payer: Self-pay | Admitting: Oncology

## 2021-01-08 ENCOUNTER — Telehealth: Payer: Self-pay | Admitting: *Deleted

## 2021-01-08 DIAGNOSIS — C021 Malignant neoplasm of border of tongue: Secondary | ICD-10-CM

## 2021-01-08 NOTE — Telephone Encounter (Signed)
Notified patient that MD is requesting repeat CMP week of 01/14/21 and that it can be done at [facility], which is closer to her home. She prefers to come to [facility] for her labs. Scheduling message sent.

## 2021-01-08 NOTE — Telephone Encounter (Signed)
Scheduled appt per 4/5 sch msg - pt is aware of appt date and time .

## 2021-01-09 NOTE — Progress Notes (Signed)
Terri [name], Terri [name] ([id number]) Visit Report for 01/07/2021 Chief Complaint Document Details Patient Name: Date of Service: Terri [name] 01/07/2021 7:30 A M Medical Record Number: [id number] Patient Account Number: [id number] Date of Birth/Sex: Treating RN: 1950-12-26 (70 y.o. Female) Levan Hurst Primary Care Provider: Bridgett Larsson Other Clinician: Referring Provider: Treating Provider/Extender: Philomena Doheny, A MBER Weeks in Treatment: 0 Information Obtained from: Patient Chief Complaint 01/07/2021; patient comes for review of a wound on her right lateral neck just below the mandible over a large area Electronic Signature(s) Signed: 01/07/2021 4:59:32 PM By: Linton Ham MD Entered By: Linton Ham on 01/07/2021 09:12:56 -------------------------------------------------------------------------------- Debridement Details Patient Name: Date of Service: Terri [name] 01/07/2021 7:30 A M Medical Record Number: [id number] Patient Account Number: [id number] Date of Birth/Sex: Treating RN: January 18, 1951 (70 y.o. Female) Levan Hurst Primary Care Provider: Bridgett Larsson Other Clinician: Referring Provider: Treating Provider/Extender: Philomena Doheny, A MBER Weeks in Treatment: 0 Debridement Performed for Assessment: Wound #1 Right Neck Performed By: Physician Ricard Dillon., MD Debridement Type: Debridement Level of Consciousness (Pre-procedure): Awake and Alert Pre-procedure Verification/Time Out Yes - 08:55 Taken: Start Time: 08:55 Pain Control: Other : Benzocaine 20% T Area Debrided (L x W): otal 3.5 (cm) x 8 (cm) = 28 (cm) Tissue and other material debrided: Viable, Non-Viable, Slough, Subcutaneous, Slough Level: Skin/Subcutaneous Tissue Debridement Description: Excisional Instrument: Curette Bleeding: Minimum Hemostasis Achieved: Pressure End Time: 08:57 Procedural Pain: 5 Post Procedural Pain: 2 Response to Treatment: Procedure was tolerated  well Level of Consciousness (Post- Awake and Alert procedure): Post Debridement Measurements of Total Wound Length: (cm) 3.5 Width: (cm) 8 Depth: (cm) 1.9 Volume: (cm) 41.783 Character of Wound/Ulcer Post Debridement: Requires Further Debridement Post Procedure Diagnosis Same as Pre-procedure Electronic Signature(s) Signed: 01/07/2021 4:59:32 PM By: Linton Ham MD Signed: 01/07/2021 5:00:21 PM By: Levan Hurst RN, BSN Entered By: Linton Ham on 01/07/2021 09:12:30 -------------------------------------------------------------------------------- HPI Details Patient Name: Date of Service: Terri [name] 01/07/2021 7:30 A M Medical Record Number: [id number] Patient Account Number: [id number] Date of Birth/Sex: Treating RN: [birth year] (70 y.o. Female) Levan Hurst Primary Care Provider: Bridgett Larsson Other Clinician: Referring Provider: Treating Provider/Extender: Philomena Doheny, A MBER Weeks in Treatment: 0 History of Present Illness HPI Description: and Impression 11/30/20 CLINICAL DATA: Head and neck cancer. Assess treatment response. Tongue cancer. EXAM: CT NECK WITH CONTRAST TECHNIQUE: Multidetector CT imaging of the neck was performed using the standard protocol following the bolus administration of intravenous contrast. CONTRAST 32m OMNIPAQUE IOHEXOL 300 MG/ML SOLN : COMPARISON: CT neck 09/18/2020. PET CT 08/27/2020 FINDINGS: Pharynx and larynx: Large area of necrotic tumor in the region of the right tongue base has progressed significantly since the prior study. This mass measures approximately 3.6 x 4.0 cm. There is mass-effect on the airway which remains patent. There is a large area of soft tissue mass in the left lateral tongue of uncertain etiology. This shows diffuse increase metabolism on PET and could be inflammatory or tumor. Recommend physical exam of this area. This area measures approximately 5.3 x 2.3 cm. This has progressed since the  prior CT . Vocal cords normal. Airway intact. Salivary glands: Atrophic left submandibular gland. Right submandibular gland appears encased by tumor. Negative parotid bilaterally. Thyroid: Small 5 mm thyroid nodules bilaterally. No further imaging necessary. Lymph nodes: Large necrotic submandibular lymph node mass on the right. This is ulcerated to the skin surface and shows marked progression from the prior CT This is encases the right . submandibular gland.  This mass measures approximately 6 x 7.8 cm. No enlarged lymph nodes in the left neck. Vascular: Normal vascular enhancement. Right jugular vein is encased and narrowed by tumor but is patent. Limited intracranial: Negative Visualized orbits: Negative Mastoids and visualized paranasal sinuses: Paranasal sinuses clear. Mastoid clear. Skeleton: Erosive changes of the inferior margin of the body of the mandible on the right compatible with tumor invasion. This has progressed since the prior CT No other bony metastasis identified. [name] Upper chest: Chest CT today reported separately Other: None IMPRESSION: 1. Marked progression of necrotic mass in the base of the tongue measuring approximately 3.6 x 4.0 cm. There is significant mass-effect on the airway which is patent. 2. There is marked enlargement of necrotic and ulcerated mass in the right submandibular region compatible with lymph node progression. This mass measures 6 x 7.8 cm. 3. Large area of mass-effect in the left lateral tongue of uncertain etiology. This was hypermetabolic on PET and could be inflammatory or due to tumor. Correlate with physical exam. 4. Progressive bony destruction of the mandible compatible with metastatic disease. 08/27/20 CLINICAL DATA: Subsequent treatment strategy for head neck cancer, status post tongue resection, chemotherapy, and radiation. EXAM: NUCLEAR MEDICINE PET SKULL BASE TO THIGH TECHNIQUE: 9.6 mCi F-18 FDG was injected  intravenously. Full-ring PET imaging was performed from the skull base to thigh after the radiotracer. CT data was obtained and used for attenuation correction and anatomic localization. Fasting blood glucose: 70 mg/dl COMPARISON: CT neck dated 06/08/2020. PET-CT dated 05/21/2020. FINDINGS: Mediastinal blood pool activity: SUV max 3.1 Liver activity: SUV max NA NECK: 19 mm short axis right submandibular node (series 3/image 93), max SUV 12.0, previously 11.5. Asymmetric hypermetabolism involving the left anterior tongue in this patient status post right partial glossectomy, max SUV 14.1. This is favored to be physiologic/reactive rather than indicative of underlying tumor. Incidental CT findings: none CHEST 7 mm short axis anterior mediastinal/prevascular node (series : 3/image 118), max SUV 4.1. Otherwise, no suspicious thoracic lymphadenopathy. No hypermetabolic pulmonary nodules. Mild hypermetabolism along the right lateral chest wall, max SUV 4.2, likely physiologic/reactive. Incidental CT findings: Left chest port terminates at the cavoatrial junction. Mild atherosclerotic calcifications of the aortic arch. Mild eventration of left hemidiaphragm. ABDOMEN/PELVIS: No abnormal hypermetabolism involving the liver, spleen, pancreas, or adrenal glands. No hypermetabolic abdominopelvic lymphadenopathy. Incidental CT findings: Mild atherosclerotic calcifications of the aortic arch. SKELETON: No focal hypermetabolic activity to suggest skeletal metastasis. Incidental CT findings: Mild degenerative changes of the visualized thoracolumbar spine. IMPRESSION: Hypermetabolic right submandibular nodal metastasis, as above. Suspected new left anterior mediastinal/prevascular node. Attention on follow-up is suggested. Asymmetric hypermetabolism involving the left anterior tongue is favored to be physiologic/reactive in this patient status post right partial glossectomy. Electronically  Signed By: Julian Hy M.D. On: 08/28/2020 11:34 ADMISSION 01/07/2021 This is an unfortunate 70 year old non-smoking woman. 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 She underwent a right partial glossectomy and neck dissection on 05/13/2019. More recently she is received chemotherapy in December 2021 through January 2021. She had a course of radiation for an expanding mass in her right tongue metastatic to the underlying mandible. She is followed by Dr. Learta Codding Dr Lanell Persons of medical and radiation oncology respectively. I was called by Dr. Lanell Persons [name] week. She had a very large mass at 1 point which started in October as a small pimple-like lesion and then gradually expanded. Fortunately the radiation was able to get this to contract although she  is been left with a large wound on her right upper neck underneath the mandible. There is a small area of exposed mandible. Dr. Lanell Persons was wondering whether anything can be done from a wound care point of view. The patient states that she was offered additional radiation by Dr. Lanell Persons and Dr. Lanell Persons also told me this although the patient is not ready for any additional radiation. She has refused a feeding tube but is having increasing difficulty eating. Past medical history includes hypertension, osteoarthritis. Mostly dominated by metastatic squamous cell CA of the tongue including mets to the chest. Her [name] CT scan was in February on February 25/22. This showed marked progression of the necrotic mass in the base of her tongue measuring 3.6 x 4 cm there was masslike effect on the airway. There was marked enlargement and necrotic ulcerated mass in the right submandibular region compatible with lymph node progression the mass at the time measured 6 x 7.8. There is also a large mass area on the left lateral tongue of indeterminant etiology either tumor or inflammatory. The patient lives in [name] but  wishes to come and stay within the [facility] health system. She has only been using a dry dressing to this area. As mentioned she was able to show me a picture on her cell phone of the large protruding mass from her lower jaw but it that is shrunk dramatically since she had radiation. Her palliative radiation was from 12/04/2020 through 12/06/2020 in 4 fractions Electronic Signature(s) Signed: 01/07/2021 4:59:32 PM By: Linton Ham MD Entered By: Linton Ham on 01/07/2021 09:21:09 -------------------------------------------------------------------------------- Physical Exam Details Patient Name: Date of Service: LENAH, MESSENGER 01/07/2021 7:30 A M Medical Record Number: [id number] Patient Account Number: [id number] Date of Birth/Sex: Treating RN: 07/02/1951 (70 y.o. Female) Levan Hurst Primary Care Provider: Bridgett Larsson Other Clinician: Referring Provider: Treating Provider/Extender: Philomena Doheny, A MBER Weeks in Treatment: 0 Constitutional Sitting or standing Blood Pressure is within target range for patient.. Pulse regular and within target range for patient.[name] Respirations regular, non-labored and within target range.. Temperature is normal and within the target range for the patient.[name] Appears in no distress. Ears, Nose, Mouth, and Throat The patient has trismus cannot really open her mouth that much I cannot really see internally. Neck The patient has a large wound on the posterior part of the right mandible in the upper neck. There is exposed mandible distally. Completely necrotic surface. Respiratory work of breathing is normal. Notes Wound exam; right lateral neck. Completely necrotic surface on a large wound. There is exposed bone. Using a open curette I was able to remove most of the necrosis to a reasonably healthy looking tissue. There was no evidence of infection. No evidence that this probes internally. Did not appear infected Electronic Signature(s) Signed:  01/07/2021 4:59:32 PM By: Linton Ham MD Entered By: Linton Ham on 01/07/2021 09:22:37 -------------------------------------------------------------------------------- Physician Orders Details Patient Name: Date of Service: Terri MADELEIN, MAHADEO 01/07/2021 7:30 A M Medical Record Number: [id number] Patient Account Number: [id number] Date of Birth/Sex: Treating RN: [birth year] (70 y.o. Female) Levan Hurst Primary Care Provider: Bridgett Larsson Other Clinician: Referring Provider: Treating Provider/Extender: Philomena Doheny, A MBER Weeks in Treatment: 0 Verbal / Phone Orders: No Diagnosis Coding Follow-up Appointments Return Appointment in 1 week. Bathing/ Shower/ Hygiene May shower and wash wound with soap and water. - on days that dressing is changed Wound Treatment Wound #1 - Neck Wound Laterality: Right Cleanser: Soap and Water Every Other Day/15  Days Discharge Instructions: May shower and wash wound with dial antibacterial soap and water prior to dressing change. Cleanser: [place] Ancillary Kit - 15 Day Supply (DME) (Generic) Every Other Day/15 Days Discharge Instructions: Use supplies as instructed; Kit contains: (15) Saline Bullets; (15) 3x3 Gauze; 15 pr Gloves Peri-Wound Care: Skin Prep (DME) (Generic) Every Other Day/15 Days Discharge Instructions: Use skin prep as directed Prim Dressing: Hydrofera Blue Ready Foam, 4x5 in (DME) (Generic) Every Other Day/15 Days ary Discharge Instructions: Apply to wound bed as instructed Secondary Dressing: Woven Gauze Sponge, Non-Sterile 4x4 in (DME) (Generic) Every Other Day/15 Days Discharge Instructions: Apply over primary dressing as directed. Secondary Dressing: ABD Pad, 5x9 (DME) Every Other Day/15 Days Discharge Instructions: Apply over primary dressing as directed. Secured With: 36M Medipore H Soft Cloth Surgical Tape, 2x2 (in/yd) (DME) (Generic) Every Other Day/15 Days Discharge Instructions: Secure dressing with tape as  directed. Electronic Signature(s) Signed: 01/07/2021 4:59:32 PM By: Linton Ham MD Signed: 01/07/2021 5:00:21 PM By: Levan Hurst RN, BSN Entered By: Levan Hurst on 01/07/2021 09:10:05 -------------------------------------------------------------------------------- Problem List Details Patient Name: Date of Service: Terri EARLEEN, AOUN 01/07/2021 7:30 A M Medical Record Number: [id number] Patient Account Number: [id number] Date of Birth/Sex: Treating RN: 1951-02-26 (70 y.o. 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 Female) Levan Hurst Primary Care Provider: Bridgett Larsson Other Clinician: Referring Provider: Treating Provider/Extender: Philomena Doheny, A MBER Weeks in Treatment: 0 Subjective Chief Complaint Information obtained from Patient 01/07/2021; patient comes for review of a wound on her right lateral neck just below the mandible over a large area History of Present Illness (HPI) and Impression 11/30/20 CLINICAL DATA: Head and neck cancer. Assess treatment  response. Tongue cancer. EXAM: CT NECK WITH CONTRAST TECHNIQUE: Multidetector CT imaging of the neck was performed using the standard protocol following the bolus administration of intravenous contrast. CONTRAST 67m OMNIPAQUE IOHEXOL 300 MG/ML SOLN : COMPARISON: CT neck 09/18/2020. PET CT 08/27/2020 FINDINGS: Pharynx and larynx: Large area of necrotic tumor in the region of the right tongue base has progressed significantly since the prior study. This mass measures approximately 3.6 x 4.0 cm. There is mass-effect on the airway which remains patent. There is a large area of soft tissue mass in the left lateral tongue of uncertain etiology. This shows diffuse increase metabolism on PET and could be inflammatory or tumor. Recommend physical exam of this area. This area measures approximately 5.3 x 2.3 cm. This has progressed since the prior CT . Vocal cords normal. Airway intact. Salivary glands: Atrophic left submandibular gland. Right submandibular gland appears encased by tumor. Negative parotid bilaterally. Thyroid: Small 5 mm thyroid nodules bilaterally. No further imaging necessary. Lymph nodes: Large necrotic submandibular lymph node mass on the right. This is ulcerated to the skin surface and shows marked progression from  the prior CT This is encases the right . submandibular gland. This mass measures approximately 6 x 7.8 cm. No enlarged lymph nodes in the left neck. Vascular: Normal vascular enhancement. Right jugular vein is encased and narrowed by tumor but is patent. Limited intracranial: Negative Visualized orbits: Negative Mastoids and visualized paranasal sinuses: Paranasal sinuses clear. Mastoid clear. Skeleton: Erosive changes of the inferior margin of the body of the mandible on the right compatible with tumor invasion. This has progressed since the prior CT No other bony metastasis identified. [name] Upper chest: Chest CT today reported separately Other:  None IMPRESSION: 1. Marked progression of necrotic mass in the base of the tongue measuring approximately 3.6 x 4.0 cm. There is significant mass-effect on the airway which is patent. 2. There is marked enlargement of necrotic and ulcerated mass in the right submandibular region compatible with lymph node progression. This mass measures 6 x 7.8 cm. 3. Large area of mass-effect in the left lateral tongue of uncertain etiology. This was hypermetabolic on PET and could be inflammatory or due to tumor. Correlate with physical exam. 4. Progressive bony destruction of the mandible compatible with metastatic disease. 08/27/20 CLINICAL DATA: Subsequent treatment strategy for head neck cancer, status post tongue resection, chemotherapy, and radiation. EXAM: NUCLEAR MEDICINE PET SKULL BASE TO THIGH TECHNIQUE: 9.6 mCi F-18 FDG was injected intravenously. Full-ring PET imaging was performed from the skull base to thigh after the radiotracer. CT data was obtained and used for attenuation correction and anatomic localization. Fasting blood glucose: 70 mg/dl COMPARISON: CT neck dated 06/08/2020. PET-CT dated 05/21/2020. FINDINGS: Mediastinal blood pool activity: SUV max 3.1 Liver activity: SUV max NA NECK: 19 mm short axis right submandibular node (series 3/image 93), max SUV 12.0, previously 11.5. Asymmetric hypermetabolism involving the left anterior tongue in this patient status post right partial glossectomy, max SUV 14.1. This is favored to be physiologic/reactive rather than indicative of underlying tumor. Incidental CT findings: none CHEST 7 mm short axis anterior mediastinal/prevascular node (series : 3/image 118), max SUV 4.1. Otherwise, no suspicious thoracic lymphadenopathy. No hypermetabolic pulmonary nodules. Mild hypermetabolism along the right lateral chest wall, max SUV 4.2, likely physiologic/reactive. Incidental CT findings: Left chest port terminates at the  cavoatrial junction. Mild atherosclerotic calcifications of the aortic arch. Mild eventration of left hemidiaphragm. ABDOMEN/PELVIS: No abnormal hypermetabolism involving the liver, spleen, pancreas, or adrenal glands. No hypermetabolic abdominopelvic lymphadenopathy. Incidental CT findings: Mild atherosclerotic calcifications of the aortic arch. SKELETON: No focal hypermetabolic activity to suggest skeletal metastasis. Incidental CT findings: Mild degenerative changes of the visualized thoracolumbar spine. IMPRESSION: Hypermetabolic right submandibular nodal metastasis, as above. Suspected new left anterior mediastinal/prevascular node. Attention on follow-up is suggested. Asymmetric hypermetabolism involving the left anterior tongue is favored to be physiologic/reactive in this patient status post right partial glossectomy. Electronically Signed By: Julian Hy M.D. On: 08/28/2020 11:34 ADMISSION 01/07/2021 This is an unfortunate 70 year old non-smoking woman. 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 She underwent a right partial glossectomy and neck dissection on 05/13/2019. More recently she is received chemotherapy in December 2021 through January 2021. She had a course of radiation for an expanding mass in her right tongue metastatic to the underlying mandible. She is followed by Dr. Learta Codding Dr Lanell Persons of medical and radiation oncology respectively. I was called by Dr. Lanell Persons [name] week. She had a very large mass at 1 point which started in October as a small pimple-like lesion and then gradually expanded. Fortunately  the radiation was able to get this to contract although she is been left with a large wound on her right upper neck underneath the mandible. There is a small area of exposed mandible. Dr. Lanell Persons was wondering whether anything can be done from a wound care point of view. The patient states that she was offered additional  radiation by Dr. Lanell Persons and Dr. Lanell Persons also told me this although the patient is not ready for any additional radiation. She has refused a feeding tube but is having increasing difficulty eating. Past medical history includes hypertension, osteoarthritis. Mostly dominated by metastatic squamous cell CA of the tongue including mets to the chest. Her [name] CT scan was in February on February 25/22. This showed marked progression of the necrotic mass in the base of her tongue measuring 3.6 x 4 cm there was masslike effect on the airway. There was marked enlargement and necrotic ulcerated mass in the right submandibular region compatible with lymph node progression the mass at the time measured 6 x 7.8. There is also a large mass area on the left lateral tongue of indeterminant etiology either tumor or inflammatory. The patient lives in [name] but wishes to come and stay within the [facility] health system. She has only been using a dry dressing to this area. As mentioned she was able to show me a picture on her cell phone of the large protruding mass from her lower jaw but it that is shrunk dramatically since she had radiation. Her palliative radiation was from 12/04/2020 through 12/06/2020 in 4 fractions Patient History Information obtained from Patient. Allergies No Known Allergies Family History Heart Disease - Mother,Father, Hypertension - Mother,Father, No family history of Cancer, Diabetes, Hereditary Spherocytosis, Kidney Disease, Lung Disease, Seizures, Stroke, Thyroid Problems, Tuberculosis. Social History Never smoker, Marital Status - Widowed, Alcohol Use - Never, Drug Use - No History, Caffeine Use - Rarely. 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, Hypertension Denies history of Angina, Arrhythmia, Congestive Heart Failure, Deep Vein Thrombosis, Hypotension, Myocardial Infarction, Peripheral Arterial Disease, Peripheral Venous Disease, Phlebitis, Vasculitis Gastrointestinal Denies history of Cirrhosis , Colitis, Crohnoos, Hepatitis A, Hepatitis B, Hepatitis C Endocrine Denies history of Type I Diabetes, Type II Diabetes Genitourinary Denies history of End Stage Renal Disease Immunological Denies history of Lupus Erythematosus, Raynaudoos, Scleroderma Integumentary (Skin) Denies history of History of Burn Musculoskeletal Patient has history of Osteoarthritis Denies history of Gout, Rheumatoid Arthritis, Osteomyelitis Neurologic Denies history of Dementia, Neuropathy, Quadriplegia, Paraplegia, Seizure Disorder Oncologic Patient has history of Received Radiation Denies history of Received Chemotherapy Hospitalization/Surgery History - bronchial biopsy. - tubal ligation. - neck dissection. Medical A Surgical History Notes nd Gastrointestinal GERD Oncologic hx of radiation and chemotherapy, ended August 06, 2019 Review of Systems (ROS) Constitutional Symptoms (General Health) Denies complaints or symptoms of Fatigue, Fever, Chills, Marked Weight Change. Eyes Denies complaints or symptoms of Dry Eyes, Vision Changes, Glasses / Contacts. Ear/Nose/Mouth/Throat Denies complaints or symptoms of Chronic sinus problems or rhinitis. Respiratory Denies complaints or symptoms of Chronic or frequent coughs, Shortness of Breath. Cardiovascular Denies complaints or symptoms of Chest pain. Gastrointestinal Denies complaints or symptoms of Frequent diarrhea, Nausea,  Vomiting. Endocrine Denies complaints or  symptoms of Heat/cold intolerance. Genitourinary Denies complaints or symptoms of Frequent urination. Integumentary (Skin) Complains or has symptoms of Wounds. Musculoskeletal Denies complaints or symptoms of Muscle Pain, Muscle Weakness. Neurologic Denies complaints or symptoms of Numbness/parasthesias. Psychiatric Denies complaints or symptoms of Claustrophobia, Suicidal. Objective Constitutional Sitting or standing Blood Pressure is within target range for patient.. Pulse regular and within target range for patient.[name] Respirations regular, non-labored and within target range.. Temperature is normal and within the target range for the patient.[name] Appears in no distress. Vitals Time Taken: 7:50 AM, Temperature: 97.8 F, Pulse: 96 bpm, Respiratory Rate: 17 breaths/min, Blood Pressure: 121/96 mmHg. Ears, Nose, Mouth, and Throat The patient has trismus cannot really open her mouth that much I cannot really see internally. Neck The patient has a large wound on the posterior part of the right mandible in the upper neck. There is exposed mandible distally. Completely necrotic surface. Respiratory work of breathing is normal. General Notes: Wound exam; right lateral neck. Completely necrotic surface on a large wound. There is exposed bone. Using a open curette I was able to remove most of the necrosis to a reasonably healthy looking tissue. There was no evidence of infection. No evidence that this probes internally. Did not appear infected Integumentary (Hair, Skin) Wound #1 status is Open. Original cause of wound was Radiation Burn. The date acquired was: 07/06/2020. The wound is located on the Right Neck. The wound measures 3.5cm length x 8cm width x 1.9cm depth; 21.991cm^2 area and 41.783cm^3 volume. There is no tunneling or undermining noted. There is a medium amount of serosanguineous drainage noted. The wound margin is distinct with the outline  attached to the wound base. There is no granulation within the wound bed. There is a large (67-100%) amount of necrotic tissue within the wound bed including Eschar and Adherent Slough. 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 There was a Excisional Skin/Subcutaneous Tissue Debridement with a total area of 28 sq cm performed by Ricard Dillon., MD. With the following instrument(s): Curette to remove Viable and Non-Viable tissue/material. Material removed includes Subcutaneous Tissue and Slough and after achieving pain control using Other (Benzocaine 20%). No specimens were taken. A time out was conducted at 08:55, prior to the start of the procedure. A Minimum amount of bleeding was controlled with Pressure. The procedure was tolerated well with a pain level of 5 throughout and a pain level of 2 following the procedure. Post Debridement Measurements: 3.5cm length x 8cm width x 1.9cm depth; 41.783cm^3 volume. Character of Wound/Ulcer Post Debridement requires further debridement. Post procedure Diagnosis Wound #1: Same as Pre-Procedure Plan Follow-up Appointments: Return Appointment in 1 week. Bathing/ Shower/ Hygiene: May shower and wash wound with soap and water. - on days that dressing is changed WOUND #1: - Neck Wound Laterality: Right Cleanser: Soap and Water Every Other Day/15 Days Discharge Instructions: May shower and wash wound with dial antibacterial soap and water prior to dressing change. Cleanser: [place] Ancillary Kit - 15 Day Supply (DME) (Generic) Every Other Day/15 Days Discharge Instructions: Use supplies as instructed; Kit contains: (15) Saline Bullets; (15) 3x3 Gauze; 15 pr Gloves Peri-Wound  Care: Skin Prep (DME) (Generic) Every Other Day/15 Days Discharge Instructions: Use skin prep as directed Prim Dressing:  Hydrofera Blue Ready Foam, 4x5 in (DME) (Generic) Every Other Day/15 Days ary Discharge Instructions: Apply to wound bed as instructed Secondary Dressing: Woven Gauze Sponge, Non-Sterile 4x4 in (DME) (Generic) Every Other Day/15 Days Discharge Instructions: Apply over primary dressing as directed. Secondary Dressing: ABD Pad, 5x9 (DME) Every Other Day/15 Days Discharge Instructions: Apply over primary dressing as directed. Secured With: 69M Medipore H Soft Cloth Surgical T ape, 2x2 (in/yd) (DME) (Generic) Every Other Day/15 Days Discharge Instructions: Secure dressing with tape as directed. 1. I gave the patient the option of a very palliative approach to this with a different type of dressing but no active visitations to our clinic or ongoing debridements however she wanted to see whether we could do anything to get some additional tissue here. Therefore I went ahead and debrided this. We are going to use [facility] which has some antibacterial properties. 2. Her son asked me about prognosis for this wound and I told him that this is a radiated tissue which really makes "healing" unlikely. But it really depends on whether there is underlying cancer here. 3. She was quite clear that she does not want additional radiation 4. After a really significant ethical discussion the patient said she wanted to come here weekly so we can look at this and see if we can make any progress. I thought it was reasonable to give the patient this chance although I was quite clear that I am not optimistic. I spent 45 minutes in review this patient's past medical history, face-to-face evaluation review of recent imaging and preparation of this record Electronic Signature(s) Signed: 01/07/2021 4:59:32 PM By: Linton Ham MD Entered By: Linton Ham on 01/07/2021  09:25:05 -------------------------------------------------------------------------------- HxROS Details Patient Name: Date of Service: Terri LASHANDA, STORLIE 01/07/2021 7:30 A M Medical Record Number: [id number] Patient Account Number: [id number] Date of Birth/Sex: Treating RN: 04-20-1951 (70 y.o. Female) Rhae Hammock Primary Care Provider: Bridgett Larsson Other Clinician: Referring Provider: Treating Provider/Extender: Philomena Doheny, A MBER Weeks in Treatment: 0 Information Obtained From Patient Constitutional Symptoms (General Health) Complaints and Symptoms: Negative for: Fatigue; Fever; Chills; Marked Weight Change Eyes Complaints and Symptoms: Negative for: Dry Eyes; Vision Changes; Glasses / Contacts Medical History: Negative for: Cataracts; Glaucoma; Optic Neuritis Ear/Nose/Mouth/Throat Complaints and Symptoms: Negative for: Chronic sinus problems or rhinitis Medical History: Negative for: Chronic sinus problems/congestion; Middle ear problems Respiratory Complaints and Symptoms: Negative for: Chronic or frequent coughs; Shortness of Breath Medical History: Negative for: Aspiration; Asthma; Chronic Obstructive Pulmonary Disease (COPD); Pneumothorax; Sleep Apnea; Tuberculosis Cardiovascular Complaints and Symptoms: Negative for: Chest pain Medical History: Positive for: 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; Hypertension Negative for: Angina; Arrhythmia; Congestive Heart Failure; Deep Vein Thrombosis; Hypotension; Myocardial Infarction; Peripheral Arterial Disease; Peripheral Venous Disease; Phlebitis; Vasculitis Gastrointestinal Complaints and Symptoms: Negative for: Frequent diarrhea; Nausea; Vomiting Medical History: Negative for: Cirrhosis ; Colitis; Crohns; Hepatitis A; Hepatitis B; Hepatitis C Past Medical History Notes: GERD Endocrine Complaints and Symptoms: Negative for: Heat/cold intolerance Medical History: Negative for: Type I  Diabetes; Type II Diabetes Genitourinary Complaints and Symptoms: Negative for: Frequent urination Medical History: Negative for: End Stage Renal Disease Integumentary (Skin) Complaints and Symptoms: Positive for: Wounds Medical History: Negative for: History of Burn Musculoskeletal Complaints and Symptoms: Negative for: Muscle Pain; Muscle Weakness Medical History: Positive for: Osteoarthritis Negative for: Gout; Rheumatoid Arthritis; Osteomyelitis Neurologic Complaints and Symptoms: Negative for: Numbness/parasthesias Medical History: Negative for: Dementia; Neuropathy; Quadriplegia; Paraplegia; Seizure Disorder Psychiatric Complaints and Symptoms: Negative for:  Claustrophobia; Suicidal Hematologic/Lymphatic Medical History: Negative for: Anemia; Hemophilia; Human Immunodeficiency Virus; Lymphedema; Sickle Cell Disease Immunological Medical History: Negative for: Lupus Erythematosus; Raynauds; Scleroderma Oncologic Medical History: Positive for: Received Radiation Negative for: Received Chemotherapy Past Medical History Notes: hx of radiation and chemotherapy, ended August 06, 2019 Immunizations Pneumococcal Vaccine: Received Pneumococcal Vaccination: No Implantable Devices Yes Hospitalization / Surgery History Type of Hospitalization/Surgery bronchial biopsy tubal ligation neck dissection Family and Social History Cancer: No; Diabetes: No; Heart Disease: Yes - Mother,Father; Hereditary Spherocytosis: No; Hypertension: Yes - Mother,Father; Kidney Disease: No; Lung Disease: No; Seizures: No; Stroke: No; Thyroid Problems: No; Tuberculosis: No; Never smoker; Marital Status - Widowed; Alcohol Use: Never; Drug Use: No History; Caffeine Use: Rarely; Financial Concerns: No; Food, Clothing or Shelter Needs: No; Support System Lacking: No; Transportation Concerns: No Electronic Signature(s) Signed: 01/07/2021 4:59:32 PM By: Linton Ham MD Signed: 01/09/2021 5:28:50 PM  By: Rhae Hammock RN Entered By: Rhae Hammock on 01/07/2021 07:59:11 -------------------------------------------------------------------------------- SuperBill Details Patient Name: Date of Service: Terri DJENEBA, BARSCH 01/07/2021 Medical Record Number: [id number] Patient Account Number: [id number] Date of Birth/Sex: Treating RN: [birth year] (70 y.o. 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 Facility Procedures CPT4 Code: [id number] Description: [facility] VISIT-LEV 3 EST PT Modifier: 25 Quantity: 1 CPT4 Code: [id number] Description: 13086 - DEB SUBQ TISSUE 20 SQ CM/< ICD-10 Diagnosis Description L98.494 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 Modifier: Quantity: 1 Physician Procedures : CPT4 Code Description Modifier [id number] - WC PHYS LEVEL 4 - NEW PT 25 ICD-10 Diagnosis Description C44.320 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 C01 Malignant neoplasm of base of tongue L98.494 Non-pressure chronic ulcer of  skin of other sites with necrosis of bone Quantity: 1 : [id number] 11042 - WC PHYS SUBQ TISS 20 SQ CM ICD-10 Diagnosis Description L98.494 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

## 2021-01-09 NOTE — Progress Notes (Signed)
[name], [name] ([id number]) Visit Report for 01/07/2021 Allergy List Details Patient Name: Date of Service: Terri [name], Terri [name] 01/07/2021 7:30 A M Medical Record Number: [id number] Patient Account Number: [id number] Date of Birth/Sex: Treating RN: 11/27/1950 (70 y.o. Female) Rhae Hammock Primary Care Iviona Hole: Bridgett Larsson Other Clinician: Referring Melisa Donofrio: Treating Erikah Thumm/Extender: Philomena Doheny, A MBER Weeks in Treatment: 0 Allergies Active Allergies No Known Allergies Allergy Notes Electronic Signature(s) Signed: 01/09/2021 5:28:50 PM By: Rhae Hammock RN Entered By: Rhae Hammock on 01/07/2021 07:51:41 -------------------------------------------------------------------------------- Arrival Information Details Patient Name: Date of Service: Terri [name], Terri [name] 01/07/2021 7:30 A M Medical Record Number: [id number] Patient Account Number: [id number] Date of Birth/Sex: Treating RN: 06/05/1951 (70 y.o. Female) Rhae Hammock Primary Care Iolani Twilley: Bridgett Larsson Other Clinician: Referring Marinus Eicher: Treating Lunabelle Oatley/Extender: Philomena Doheny, A MBER Weeks in Treatment: 0 Visit Information Patient Arrived: Ambulatory Arrival Time: 07:48 Accompanied By: son Transfer Assistance: None Patient Identification Verified: Yes Secondary Verification Process Completed: Yes Patient Requires Transmission-Based Precautions: No Patient Has Alerts: No Electronic Signature(s) Signed: 01/09/2021 5:28:50 PM By: Rhae Hammock RN Entered By: Rhae Hammock on 01/07/2021 07:48:38 -------------------------------------------------------------------------------- Clinic Level of Care Assessment Details Patient Name: Date of Service: Terri [name], Terri [name] 01/07/2021 7:30 A M Medical Record Number: [id number] Patient Account Number: [id number] Date of Birth/Sex: Treating RN: 08/15/1951 (70 y.o. Female) Levan Hurst Primary Care Aariya Ferrick: Bridgett Larsson Other  Clinician: Referring Saman Umstead: Treating Carel Carrier/Extender: Philomena Doheny, A MBER Weeks in Treatment: 0 Clinic Level of Care Assessment Items TOOL 1 Quantity Score X- 1 0 Use when EandM and Procedure is performed on INITIAL visit ASSESSMENTS - Nursing Assessment / Reassessment X- 1 20 General Physical Exam (combine w/ comprehensive assessment (listed just below) when performed on new pt. evals) X- 1 25 Comprehensive Assessment (HX, ROS, Risk Assessments, Wounds Hx, etc.) ASSESSMENTS - Wound and Skin Assessment / Reassessment [] - 0 Dermatologic / Skin Assessment (not related to wound area) ASSESSMENTS - Ostomy and/or Continence Assessment and Care [] - 0 Incontinence Assessment and Management [] - 0 Ostomy Care Assessment and Management (repouching, etc.) PROCESS - Coordination of Care X - Simple Patient / Family Education for ongoing care 1 15 [] - 0 Complex (extensive) Patient / Family Education for ongoing care X- 1 10 Staff obtains Programmer, systems, Records, T Results / Process Orders est [] - 0 Staff telephones HHA, Nursing Homes / Clarify orders / etc [] - 0 Routine Transfer to another Facility (non-emergent condition) [] - 0 Routine Hospital Admission (non-emergent condition) X- 1 15 New Admissions / Biomedical engineer / Ordering NPWT Apligraf, etc. , [] - 0 Emergency Hospital Admission (emergent condition) PROCESS - Special Needs [] - 0 Pediatric / Minor Patient Management [] - 0 Isolation Patient Management [] - 0 Hearing / Language / Visual special needs [] - 0 Assessment of Community assistance (transportation, D/C planning, etc.) [] - 0 Additional assistance / Altered mentation [] - 0 Support Surface(s) Assessment (bed, cushion, seat, etc.) INTERVENTIONS - Miscellaneous [] - 0 External ear exam [] - 0 Patient Transfer (multiple staff / Civil Service fast streamer / Similar devices) [] - 0 Simple Staple / Suture removal (25 or less) [] - 0 Complex Staple  / Suture removal (26 or more) [] - 0 Hypo/Hyperglycemic Management (do not check if billed separately) [] - 0 Ankle / Brachial Index (ABI) - do not check if billed separately Has the patient been seen at the hospital within the [name] three years: Yes Total Score: 85 Level Of Care:  New/Established - Level 3 Electronic Signature(s) Signed: 01/07/2021 5:00:21 PM By: Levan Hurst RN, BSN Entered By: Levan Hurst on 01/07/2021 09:20:08 -------------------------------------------------------------------------------- Encounter Discharge Information Details Patient Name: Date of Service: Terri [name], Terri [name] 01/07/2021 7:30 A M Medical Record Number: [id number] Patient Account Number: [id number] Date of Birth/Sex: Treating RN: 1951-01-01 (70 y.o. Female) Lorrin Jackson Primary Care Shabnam Ladd: Bridgett Larsson Other Clinician: Referring Charis Juliana: Treating Rushi Chasen/Extender: Philomena Doheny, A MBER Weeks in Treatment: 0 Encounter Discharge Information Items Post Procedure Vitals Discharge Condition: Stable Temperature (F): 97.8 Ambulatory Status: Ambulatory Pulse (bpm): 96 Discharge Destination: Home Respiratory Rate (breaths/min): 17 Transportation: Private Auto Blood Pressure (mmHg): 121/96 Accompanied By: Son Schedule Follow-up Appointment: Yes Clinical Summary of Care: Provided on 01/07/2021 Form Type Recipient Paper Patient Patient Electronic Signature(s) Signed: 01/07/2021 9:28:08 AM By: Lorrin Jackson Entered By: Lorrin Jackson on 01/07/2021 09:28:07 -------------------------------------------------------------------------------- Lower Extremity Assessment Details Patient Name: Date of Service: Terri [name], Terri [name] 01/07/2021 7:30 A M Medical Record Number: [id number] Patient Account Number: [id number] Date of Birth/Sex: Treating RN: [birth year] (70 y.o. Female) Rhae Hammock Primary Care Zelta Enfield: Bridgett Larsson Other Clinician: Referring Hanaa Payes: Treating  Landa Mullinax/Extender: Philomena Doheny, A MBER Weeks in Treatment: 0 Electronic Signature(s) Signed: 01/09/2021 5:28:50 PM By: Rhae Hammock RN Entered By: Rhae Hammock on 01/07/2021 07:53:19 -------------------------------------------------------------------------------- Multi Wound Chart Details Patient Name: Date of Service: Terri [name], Terri [name] 01/07/2021 7:30 A M Medical Record Number: [id number] Patient Account Number: [id number] Date of Birth/Sex: Treating RN: [birth year] (70 y.o. 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] [N/A:N/A] Primary Etiology: [1:Coronary Artery Disease,] [N/A:N/A] Comorbid History: [1:Hypertension, Osteoarthritis, Received Radiation 07/06/2020] [N/A:N/A] Date Acquired: [1:0] [N/A:N/A] Weeks of Treatment: [1:Open] [N/A:N/A] Wound Status: [1:3.5x8x1.9] [N/A:N/A] Measurements L x W x D (cm) [1:21.991] [N/A:N/A] A (cm) : rea [1:41.783] [N/A:N/A] Volume (cm) : [1:Full Thickness Without Exposed] [N/A:N/A] Classification: [1:Support Structures Medium] [N/A:N/A] Exudate A mount: [1:Serosanguineous] [N/A:N/A] Exudate Type: [1:red, brown] [N/A:N/A] Exudate Color: [1:Distinct, outline attached] [N/A:N/A] Wound Margin: [1:None Present (0%)] [N/A:N/A] Granulation A mount: [1:Large (67-100%)] [N/A:N/A] Necrotic A mount: [1:Eschar, Adherent Slough] [N/A:N/A] Necrotic Tissue: [1:Fascia: No] [N/A:N/A] Exposed Structures: [1:Fat Layer (Subcutaneous Tissue): No Tendon: No Muscle: No Joint: No Bone: No None]  [N/A:N/A] Epithelialization: [1:Debridement - Excisional] [N/A:N/A] Debridement: Pre-procedure Verification/Time Out 08:55 [N/A:N/A] Taken: [1:Other] [N/A:N/A] Pain Control: [1:Subcutaneous, Slough] [N/A:N/A] Tissue Debrided: [1:Skin/Subcutaneous Tissue] [N/A:N/A] Level: [1:28] [N/A:N/A] Debridement A (sq cm): [1:rea Curette] [N/A:N/A] Instrument: [1:Minimum] [N/A:N/A] Bleeding: [1:Pressure] [N/A:N/A] Hemostasis A chieved: [1:5] [N/A:N/A] Procedural Pain: [1:2] [N/A:N/A] Post Procedural Pain: [1:Procedure was tolerated well] [N/A:N/A] Debridement Treatment Response: [1:3.5x8x1.9] [N/A:N/A] Post Debridement Measurements L x W x D (cm) [1:41.783] [N/A:N/A] Post Debridement Volume: (cm) [1:Debridement] [N/A:N/A] Treatment Notes Electronic Signature(s) Signed: 01/07/2021 4:59:32 PM By: Linton Ham MD Signed: 01/07/2021 5:00:21 PM By: Levan Hurst RN, BSN Entered By: Linton Ham on 01/07/2021 09:12:11 -------------------------------------------------------------------------------- Multi-Disciplinary Care Plan Details Patient Name: Date of Service: Terri [name], Terri [name] 01/07/2021 7:30 A M Medical Record Number: [id number] Patient Account Number: [id number] Date of Birth/Sex: Treating RN: [birth year] (70 y.o. Female) Levan Hurst Primary Care Cabe Lashley: Bridgett Larsson Other Clinician: Referring Sefora Tietje: Treating Maren Wiesen/Extender: Philomena Doheny, A MBER Weeks in Treatment: 0 Multidisciplinary Care Plan reviewed with physician Active Inactive  Malignancy/Atypical Etiology Nursing Diagnoses: Knowledge deficit related to disease process and management of malignancy Goals: Patient/caregiver will verbalize understanding of disease process and disease management of malignancy Date Initiated: 01/07/2021 Target Resolution Date: 02/08/2021 Goal Status: Active Interventions: Assess patient and family medical history for signs and symptoms of malignancy/atypical etiology upon  admission Provide education on malignant ulcerations Notes: Nutrition Nursing Diagnoses: Potential for alteratiion in Nutrition/Potential for imbalanced nutrition Goals: Patient/caregiver agrees to and verbalizes understanding of need to use nutritional supplements and/or vitamins as prescribed Date Initiated: 01/07/2021 Target Resolution Date: 02/08/2021 Goal Status: Active Interventions: Assess patient nutrition upon admission and as needed per policy Provide education on nutrition Notes: Wound/Skin Impairment Nursing Diagnoses: Impaired tissue integrity Knowledge deficit related to ulceration/compromised skin integrity Goals: Patient/caregiver will verbalize understanding of skin care regimen Date Initiated: 01/07/2021 Target Resolution Date: 02/08/2021 Goal Status: Active Interventions: Assess patient/caregiver ability to obtain necessary supplies Assess patient/caregiver ability to perform ulcer/skin care regimen upon admission and as needed Assess ulceration(s) every visit Provide education on ulcer and skin care Notes: Electronic Signature(s) Signed: 01/07/2021 5:00:21 PM By: Levan Hurst RN, BSN Entered By: Levan Hurst on 01/07/2021 08:47:05 -------------------------------------------------------------------------------- Pain Assessment Details Patient Name: Date of Service: Terri [name], Terri [name] 01/07/2021 7:30 A M Medical Record Number: [id number] Patient Account Number: [id number] Date of Birth/Sex: Treating RN: 1950/12/04 (70 y.o. Female) Rhae Hammock Primary Care Jance Siek: Bridgett Larsson Other Clinician: Referring Lorieann Argueta: Treating Tashanti Dalporto/Extender: Philomena Doheny, A MBER Weeks in Treatment: 0 Active Problems Location of Pain Severity and Description of Pain Patient Has Paino Yes Site Locations Pain Location: Pain in Ulcers Duration of the Pain. Constant / Intermittento Intermittent Rate the pain. Current Pain Level: 6 Worst Pain Level:  10 Least Pain Level: 0 Tolerable Pain Level: 6 Character of Pain Describe the Pain: Aching Pain Management and Medication Current Pain Management: Medication: Yes Cold Application: No Rest: Yes Massage: No Activity: No T.E.N.S.: No Heat Application: No Leg drop or elevation: No Is the Current Pain Management Adequate: Adequate How does your wound impact your activities of daily livingo Sleep: No Bathing: No Appetite: No Relationship With Others: No Bladder Continence: No Emotions: No Bowel Continence: No Work: No Toileting: No Drive: No Dressing: No Hobbies: No Electronic Signature(s) Signed: 01/09/2021 5:28:50 PM By: Rhae Hammock RN Entered By: Rhae Hammock on 01/07/2021 07:53:50 -------------------------------------------------------------------------------- Patient/Caregiver Education Details Patient Name: Date of Service: Terri [name] 4/4/2022andnbsp7:30 A M Medical Record Number: [id number] Patient Account Number: [id number] Date of Birth/Gender: Treating RN: 1950-10-24 (70 y.o. Female) Levan Hurst Primary Care Physician: Bridgett Larsson Other Clinician: Referring Physician: Treating Physician/Extender: Philomena Doheny, A MBER Weeks in Treatment: 0 Education Assessment Education Provided To: Patient Education Topics Provided Malignant/Atypical Wounds: Methods: Explain/Verbal Responses: State content correctly Nutrition: Methods: Explain/Verbal Responses: State content correctly Wound/Skin Impairment: Methods: Explain/Verbal Responses: State content correctly Electronic Signature(s) Signed: 01/07/2021 5:00:21 PM By: Levan Hurst RN, BSN Entered By: Levan Hurst on 01/07/2021 08:48:00 -------------------------------------------------------------------------------- Wound Assessment Details Patient Name: Date of Service: Terri [name], Terri [name] 01/07/2021 7:30 A M Medical Record Number: [id number] Patient Account Number: [id number] Date  of Birth/Sex: Treating RN: 07-03-1951 (70 y.o. 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 Wound Status: Open Wounding Event: Radiation Burn Comorbid Coronary Artery Disease, Hypertension, Osteoarthritis, Date Acquired: 07/06/2020 History: Received Radiation Weeks Of Treatment: 0 Clustered  Wound: No Photos Wound Measurements Length: (cm) 3.5 Width: (cm) 8 Depth: (cm) 1.9 Area: (cm) 21.991 Volume: (cm) 41.783 % Reduction in Area: 0% % Reduction in Volume: 0% Epithelialization: None Tunneling: No Undermining: No Wound Description Classification: Full Thickness Without Exposed Support Structures Wound Margin: Distinct, outline attached Exudate Amount: Medium Exudate Type: Serosanguineous Exudate Color: red, brown Foul Odor After Cleansing: No Slough/Fibrino Yes Wound Bed Granulation Amount: None Present (0%) Exposed Structure Necrotic Amount: Large (67-100%) Fascia Exposed: No Necrotic Quality: Eschar, Adherent Slough Fat Layer (Subcutaneous Tissue) Exposed: No Tendon Exposed: No Muscle Exposed: No Joint Exposed: No Bone Exposed: No Treatment Notes Wound #1 (Neck) Wound Laterality: Right Cleanser Soap and Water Discharge Instruction: May shower and wash wound with dial antibacterial soap and water prior to dressing change. [place] Ancillary Kit - 15 Day Supply Discharge Instruction: Use supplies as instructed; Kit contains: (15) Saline Bullets; (15) 3x3 Gauze; 15 pr Gloves Peri-Wound Care Skin Prep Discharge Instruction: Use skin prep as directed Topical Primary Dressing Hydrofera Blue Ready Foam, 4x5 in Discharge Instruction: Apply to wound bed as instructed Secondary Dressing Woven Gauze Sponge, Non-Sterile 4x4  in Discharge Instruction: Apply over primary dressing as directed. ABD Pad, 5x9 Discharge Instruction: Apply over primary dressing as directed. Secured With 34M [place] Surgical T ape, 2x2 (in/yd) Discharge Instruction: Secure dressing with tape as directed. Compression Wrap Compression Stockings Add-Ons Electronic Signature(s) Signed: 01/08/2021 2:26:07 PM By: Sandre Kitty Signed: 01/09/2021 5:28:50 PM By: Rhae Hammock RN Entered By: Sandre Kitty on 01/07/2021 16:11:43 -------------------------------------------------------------------------------- Vitals Details Patient Name: Date of Service: Terri [name], Terri [name] 01/07/2021 7:30 A M Medical Record Number: [id number] Patient Account Number: [id number] Date of Birth/Sex: Treating RN: 12/18/1950 (70 y.o. Female) Rhae Hammock Primary Care Fletcher Ostermiller: Bridgett Larsson Other Clinician: Referring Tye Juarez: Treating Melisssa Donner/Extender: Philomena Doheny, A MBER Weeks in Treatment: 0 Vital Signs Time Taken: 07:50 Temperature (F): 97.8 Pulse (bpm): 96 Respiratory Rate (breaths/min): 17 Blood Pressure (mmHg): 121/96 Reference Range: 80 - 120 mg / dl Electronic Signature(s) Signed: 01/09/2021 5:28:50 PM By: Rhae Hammock RN Entered By: Rhae Hammock on 01/07/2021 07:50:41

## 2021-01-09 NOTE — Progress Notes (Signed)
LAUREY, SALSER ([id number]) Visit Report for 01/07/2021 Abuse/Suicide Risk Screen Details Patient Name: Date of Service: Terri [name], Terri [name] 01/07/2021 7:30 A M Medical Record Number: [id number] Patient Account Number: [id number] Date of Birth/Sex: Treating RN: 04-Dec-1950 (70 y.o. Female) Rhae Hammock Primary Care Arrington Bencomo: Bridgett Larsson Other Clinician: Referring Amere Bricco: Treating Tia Hieronymus/Extender: Philomena Doheny, A MBER Weeks in Treatment: 0 Abuse/Suicide Risk Screen Items Answer ABUSE RISK SCREEN: Has anyone close to you tried to hurt or harm you recentlyo No Do you feel uncomfortable with anyone in your familyo No Has anyone forced you do things that you didnt want to doo No Electronic Signature(s) Signed: 01/09/2021 5:28:50 PM By: Rhae Hammock RN Entered By: Rhae Hammock on 01/07/2021 07:54:04 -------------------------------------------------------------------------------- Activities of Daily Living Details Patient Name: Date of Service: Terri [name], Terri [name] 01/07/2021 7:30 A M Medical Record Number: [id number] Patient Account Number: [id number] Date of Birth/Sex: Treating RN: 08-Jun-1951 (70 y.o. Female) Rhae Hammock Primary Care Camie Hauss: Bridgett Larsson Other Clinician: Referring Laquanna Veazey: Treating Kemani Demarais/Extender: Philomena Doheny, A MBER Weeks in Treatment: 0 Activities of Daily Living Items Answer Activities of Daily Living (Please select one for each item) Drive Automobile Completely Able T Medications ake Completely Able Use T elephone Completely Able Care for Appearance Completely Able Use T oilet Completely Able Bath / Shower Completely Able Dress Self Completely Able Feed Self Completely Able Walk Completely Able Get In / Out Bed Completely Able Housework Completely Able Prepare Meals Completely [place] Completely Able Shop for Self Completely Able Electronic Signature(s) Signed: 01/09/2021 5:28:50 PM By: Rhae Hammock RN Entered By: Rhae Hammock on 01/07/2021 07:52:22 -------------------------------------------------------------------------------- Education Screening Details Patient Name: Date of Service: Terri [name], Terri [name] 01/07/2021 7:30 A M Medical Record Number: [id number] Patient Account Number: [id number] Date of Birth/Sex: Treating RN: 12/10/1950 (70 y.o. Female) Rhae Hammock Primary Care Anndrea Mihelich: Bridgett Larsson Other Clinician: Referring Ricketta Colantonio: Treating Lionell Matuszak/Extender: Philomena Doheny, A MBER Weeks in Treatment: 0 Primary Learner Assessed: Patient Learning Preferences/Education Level/Primary Language Highest Education Level: College or Above Preferred Language: English Cognitive Barrier Language Barrier: No Translator Needed: No Memory Deficit: No Emotional Barrier: No Cultural/Religious Beliefs Affecting Medical Care: No Physical Barrier Impaired Vision: Yes Glasses Impaired Hearing: No Decreased Hand dexterity: No Knowledge/Comprehension Knowledge Level: High Comprehension Level: High Ability to understand written instructions: High Ability to understand verbal instructions: High Motivation Anxiety Level: Calm Cooperation: Cooperative Education Importance: Denies Need Interest in Health Problems: Asks Questions Perception: Coherent Willingness to Engage in Self-Management High Activities: Readiness to Engage in Self-Management High Activities: Electronic Signature(s) Signed: 01/09/2021 5:28:50 PM By: Rhae Hammock RN Entered By: Rhae Hammock on 01/07/2021 07:54:50 -------------------------------------------------------------------------------- Fall Risk Assessment Details Patient Name: Date of Service: Terri [name], Terri [name] 01/07/2021 7:30 A M Medical Record Number: [id number] Patient Account Number: [id number] Date of Birth/Sex: Treating RN: 06/19/1951 (70 y.o. Female) Rhae Hammock Primary Care Ketra Duchesne: Bridgett Larsson Other  Clinician: Referring Donelle Hise: Treating Mikaella Escalona/Extender: Philomena Doheny, A MBER Weeks in Treatment: 0 Fall Risk Assessment Items Have you had 2 or more falls in the last 12 monthso 0 No Have you had any fall that resulted in injury in the last 12 monthso 0 No FALLS RISK SCREEN History of falling - immediate or within 3 months 0 No Secondary diagnosis (Do you have 2 or more medical diagnoseso) 0 No Ambulatory aid None/bed rest/wheelchair/nurse 0 No Crutches/cane/walker 0 No Furniture 0 No Intravenous therapy Access/Saline/Heparin Lock 0 No Gait/Transferring Normal/ bed rest/ wheelchair 0 No Weak (short steps with  or without shuffle, stooped but able to lift head while walking, may seek 0 No support from furniture) Impaired (short steps with shuffle, may have difficulty arising from chair, head down, impaired 0 No balance) Mental Status Oriented to own ability 0 No Electronic Signature(s) Signed: 01/09/2021 5:28:50 PM By: Rhae Hammock RN Entered By: Rhae Hammock on 01/07/2021 07:51:55 -------------------------------------------------------------------------------- Foot Assessment Details Patient Name: Date of Service: Terri [name], Terri [name] 01/07/2021 7:30 A M Medical Record Number: [id number] Patient Account Number: [id number] Date of Birth/Sex: Treating RN: [birth year] (70 y.o. Female) Rhae Hammock Primary Care Priyah Schmuck: Bridgett Larsson Other Clinician: Referring Edita Weyenberg: Treating Annarose Ouellet/Extender: Philomena Doheny, A MBER Weeks in Treatment: 0 Foot Assessment Items Site Locations + = Sensation present, - = Sensation absent, C = Callus, U = Ulcer R = Redness, W = Warmth, M = Maceration, PU = Pre-ulcerative lesion F = Fissure, S = Swelling, D = Dryness Assessment Right: Left: Other Deformity: No No Prior Foot Ulcer: No No Prior Amputation: No No Charcot Joint: No No Ambulatory Status: Gait: Notes N/A no LE wounds and pt. not  diabetic Electronic Signature(s) Signed: 01/09/2021 5:28:50 PM By: Rhae Hammock RN Entered By: Rhae Hammock on 01/07/2021 07:53:12 -------------------------------------------------------------------------------- Nutrition Risk Screening Details Patient Name: Date of Service: Terri [name], Terri [name] 01/07/2021 7:30 A M Medical Record Number: [id number] Patient Account Number: [id number] Date of Birth/Sex: Treating RN: June 23, 1951 (70 y.o. Female) Rhae Hammock Primary Care Keyira Mondesir: Bridgett Larsson Other Clinician: Referring Kairah Leoni: Treating Antoria Lanza/Extender: Philomena Doheny, A MBER Weeks in Treatment: 0 Height (in): Weight (lbs): Body Mass Index (BMI): Nutrition Risk Screening Items Score Screening NUTRITION RISK SCREEN: I have an illness or condition that made me change the kind and/or amount of food I eat 0 No I eat fewer than two meals per day 3 Yes I eat few fruits and vegetables, or milk products 0 No I have three or more drinks of beer, liquor or wine almost every day 0 No I have tooth or mouth problems that make it hard for me to eat 0 No I don't always have enough money to buy the food I need 0 No I eat alone most of the time 0 No I take three or more different prescribed or over-the-counter drugs a day 0 No Without wanting to, I have lost or gained 10 pounds in the last six months 0 No I am not always physically able to shop, cook and/or feed myself 0 No Nutrition Protocols Good Risk Protocol 0 No interventions needed Moderate Risk Protocol High Risk Proctocol Risk Level: Moderate Risk Score: 3 Electronic Signature(s) Signed: 01/09/2021 5:28:50 PM By: Rhae Hammock RN Entered By: Rhae Hammock on 01/07/2021 07:52:52

## 2021-01-14 ENCOUNTER — Inpatient Hospital Stay: Payer: BC Managed Care – PPO

## 2021-01-14 ENCOUNTER — Encounter (HOSPITAL_BASED_OUTPATIENT_CLINIC_OR_DEPARTMENT_OTHER): Payer: BC Managed Care – PPO | Admitting: Internal Medicine

## 2021-01-14 ENCOUNTER — Other Ambulatory Visit: Payer: Self-pay

## 2021-01-14 DIAGNOSIS — L98494 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: Secondary | ICD-10-CM | POA: Diagnosis not present

## 2021-01-14 NOTE — Progress Notes (Signed)
Terri, [name] ([id number]) Visit Report for 01/14/2021 Debridement Details Patient Name: Terri of Service: Terri [name], Terri 01/14/2021 3:00 PM Medical Record Number: [id number] Patient Account Number: [id number] Terri of Birth/Sex: Treating RN: 05/06/1951 (70 y.o. Benjamine Sprague, Briant Cedar Primary Care Provider: [name] Other Clinician: Referring Provider: Treating Provider/Extender: Philomena Doheny, Dario Guardian in Treatment: 1 Debridement Performed for Assessment: Wound #1 Right Neck Performed By: Physician Ricard Dillon., MD Debridement Type: Debridement Level of Consciousness (Pre-procedure): Awake and Alert Pre-procedure Verification/Time Out Yes - 15:37 Taken: Start Time: 15:37 Pain Control: Other : Benzocaine 20% T Area Debrided (L x W): otal 4.5 (cm) x 6.7 (cm) = 30.15 (cm) Tissue and other material debrided: Viable, Non-Viable, Slough, Subcutaneous, Slough Level: Skin/Subcutaneous Tissue Debridement Description: Excisional Instrument: Curette Bleeding: Minimum Hemostasis Achieved: Pressure End Time: 15:40 Procedural Pain: 5 Post Procedural Pain: 3 Response to Treatment: Procedure was tolerated well Level of Consciousness (Post- Awake and Alert procedure): Post Debridement Measurements of Total Wound Length: (cm) 4.5 Width: (cm) 6.7 Depth: (cm) 1.2 Volume: (cm) 28.416 Character of Wound/Ulcer Post Debridement: Requires Further Debridement Post Procedure Diagnosis Same as Pre-procedure Electronic Signature(s) Signed: 01/14/2021 4:45:43 PM By: Linton Ham MD Signed: 01/14/2021 5:30:45 PM By: Levan Hurst RN, BSN Entered By: Linton Ham on 01/14/2021 15:48:46 -------------------------------------------------------------------------------- HPI Details Patient Name: Terri of Service: MA Lillia [name], Terri 01/14/2021 3:00 PM Medical Record Number: [id number] Patient Account Number: [id number] Terri of Birth/Sex: Treating RN: 1951/01/18 (70 y.o. Terri [name] Primary Care Provider: [name] Other Clinician: Referring Provider: Treating Provider/Extender: Philomena Doheny, Dario Guardian in Treatment: 1 History of Present Illness HPI Description: and Impression 11/30/20 CLINICAL DATA: Head and neck cancer. Assess treatment response. Tongue cancer. EXAM: CT NECK WITH CONTRAST TECHNIQUE: Multidetector CT imaging of the neck was performed using the standard protocol following the bolus administration of intravenous contrast. CONTRAST 48mL OMNIPAQUE IOHEXOL 300 MG/ML SOLN : COMPARISON: CT neck 09/18/2020. PET CT 08/27/2020 FINDINGS: Pharynx and larynx: Large area of necrotic tumor in the region of the right tongue base has progressed significantly since the prior study. This mass measures approximately 3.6 x 4.0 cm. There is mass-effect on the airway which remains patent. There is a large area of soft tissue mass in the left lateral tongue of uncertain etiology. This shows diffuse increase metabolism on PET and could be inflammatory or tumor. Recommend physical exam of this area. This area measures approximately 5.3 x 2.3 cm. This has progressed since the prior CT . Vocal cords normal. Airway intact. Salivary glands: Atrophic left submandibular gland. Right submandibular gland appears encased by tumor. Negative parotid bilaterally. Thyroid: Small 5 mm thyroid nodules bilaterally. No further imaging necessary. Lymph nodes: Large necrotic submandibular lymph node mass on the right. This is ulcerated to the skin surface and shows marked progression from the prior CT This is encases the right . submandibular gland. This mass measures approximately 6 x 7.8 cm. No enlarged lymph nodes in the left neck. Vascular: Normal vascular enhancement. Right jugular vein is encased and narrowed by tumor but is patent. Limited intracranial: Negative Visualized orbits: Negative Mastoids and visualized paranasal sinuses: Paranasal  sinuses clear. Mastoid clear. Skeleton: Erosive changes of the inferior margin of the body of the mandible on the right compatible with tumor invasion. This has progressed since the prior CT No other bony metastasis identified. [name] Upper chest: Chest CT today reported separately Other: None IMPRESSION: 1. Marked progression of necrotic mass in the base of the tongue measuring approximately 3.6 x  4.0 cm. There is significant mass-effect on the airway which is patent. 2. There is marked enlargement of necrotic and ulcerated mass in the right submandibular region compatible with lymph node progression. This mass measures 6 x 7.8 cm. 3. Large area of mass-effect in the left lateral tongue of uncertain etiology. This was hypermetabolic on PET and could be inflammatory or due to tumor. Correlate with physical exam. 4. Progressive bony destruction of the mandible compatible with metastatic disease. 08/27/20 CLINICAL DATA: Subsequent treatment strategy for head neck cancer, status post tongue resection, chemotherapy, and radiation. EXAM: NUCLEAR MEDICINE PET SKULL BASE TO THIGH TECHNIQUE: 9.6 mCi F-18 FDG was injected intravenously. Full-ring PET imaging was performed from the skull base to thigh after the radiotracer. CT data was obtained and used for attenuation correction and anatomic localization. Fasting blood glucose: 70 mg/dl COMPARISON: CT neck dated 06/08/2020. PET-CT dated 05/21/2020. FINDINGS: Mediastinal blood pool activity: SUV max 3.1 Liver activity: SUV max NA NECK: 19 mm short axis right submandibular node (series 3/image 93), max SUV 12.0, previously 11.5. Asymmetric hypermetabolism involving the left anterior tongue in this patient status post right partial glossectomy, max SUV 14.1. This is favored to be physiologic/reactive rather than indicative of underlying tumor. Incidental CT findings: none CHEST 7 mm short axis anterior mediastinal/prevascular node  (series : 3/image 118), max SUV 4.1. Otherwise, no suspicious thoracic lymphadenopathy. No hypermetabolic pulmonary nodules. Mild hypermetabolism along the right lateral chest wall, max SUV 4.2, likely physiologic/reactive. Incidental CT findings: Left chest port terminates at the cavoatrial junction. Mild atherosclerotic calcifications of the aortic arch. Mild eventration of left hemidiaphragm. ABDOMEN/PELVIS: No abnormal hypermetabolism involving the liver, spleen, pancreas, or adrenal glands. No hypermetabolic abdominopelvic lymphadenopathy. Incidental CT findings: Mild atherosclerotic calcifications of the aortic arch. SKELETON: No focal hypermetabolic activity to suggest skeletal metastasis. Incidental CT findings: Mild degenerative changes of the visualized thoracolumbar spine. IMPRESSION: Hypermetabolic right submandibular nodal metastasis, as above. Suspected new left anterior mediastinal/prevascular node. Attention on follow-up is suggested. Asymmetric hypermetabolism involving the left anterior tongue is favored to be physiologic/reactive in this patient status post right partial glossectomy. Electronically Signed By: Julian Hy M.D. On: 08/28/2020 11:34 ADMISSION 01/07/2021 This is an unfortunate 70 year old non-smoking woman. 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 She underwent a right partial glossectomy and neck dissection on 05/13/2019. More recently she is received chemotherapy in December 2021 through January 2021. She had a course of radiation for an expanding mass in her right tongue metastatic to the underlying mandible. She is followed by Dr. Learta Codding Dr Lanell Persons of medical and radiation oncology respectively. I was called by Dr. Lanell Persons last week. She had a very large mass at 1 point which started in October as a small pimple-like lesion and then gradually expanded. Fortunately the radiation was able to get this to  contract although she is been left with a large wound on her right upper neck underneath the mandible. There is a small area of exposed mandible. Dr. Lanell Persons was wondering whether anything can be done from a wound care point of view. The patient states that she was offered additional radiation by Dr. Lanell Persons and Dr. Lanell Persons also told me this although the patient is not ready for any additional radiation. She has refused a feeding tube but is having increasing difficulty eating. Past medical history includes hypertension, osteoarthritis. Mostly dominated by metastatic squamous cell CA of the tongue including mets to the chest. Her last CT scan was in  February on February 25/22. This showed marked progression of the necrotic mass in the base of her tongue measuring 3.6 x 4 cm there was masslike effect on the airway. There was marked enlargement and necrotic ulcerated mass in the right submandibular region compatible with lymph node progression the mass at the time measured 6 x 7.8. There is also a large mass area on the left lateral tongue of indeterminant etiology either tumor or inflammatory. The patient lives in [name] but wishes to come and stay within the [facility] health system. She has only been using a dry dressing to this area. As mentioned she was able to show me a picture on her cell phone of the large protruding mass from her lower jaw but it that is shrunk dramatically since she had radiation. Her palliative radiation was from 12/04/2020 through 12/06/2020 in 4 fractions 4/11; wound is measuring slightly smaller although I do not see a lot of difference. There is probably malignancy around the margins towards her tip of her mandible. I cleaned this off last time very thick necrotic surface and I have done the same today although I am not really sure how many more times I will do this. There are impressive pictures of the effect of radiation on this tumor this was a large fungating tumor  that responded rapidly to radiation. For this reason I have been giving her an attempted aggressive dressing changes Electronic Signature(s) Signed: 01/14/2021 4:45:43 PM By: Linton Ham MD Entered By: Linton Ham on 01/14/2021 15:50:02 -------------------------------------------------------------------------------- Physical Exam Details Patient Name: Terri of Service: MA [name], Terri 01/14/2021 3:00 PM Medical Record Number: [id number] Patient Account Number: [id number] Terri of Birth/Sex: Treating RN: Jan 01, 1951 (70 y.o. Terri [name] Primary Care Provider: [name] Other Clinician: Referring Provider: Treating Provider/Extender: Philomena Doheny, Amber Weeks in Treatment: 1 Constitutional Sitting or standing Blood Pressure is within target range for patient.. Pulse regular and within target range for patient.[name] Respirations regular, non-labored and within target range.. Temperature is normal and within the target range for the patient.[name] Appears in no distress. Notes Wound exam; less adherent debris than when I first saw this but still requiring a debridement with an open curette. I am able to get the red help healthy looking tissue although she still I think has tumor around the margins and there is some parts of this that has exposed mandible. Electronic Signature(s) Signed: 01/14/2021 4:45:43 PM By: Linton Ham MD Entered By: Linton Ham on 01/14/2021 15:50:43 -------------------------------------------------------------------------------- Physician Orders Details Patient Name: Terri of Service: MA SHALETA, RUACHO 01/14/2021 3:00 PM Medical Record Number: [id number] Patient Account Number: [id number] Terri of Birth/Sex: Treating RN: 18-Oct-1950 (70 y.o. 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 Bathing/ Shower/ Hygiene May shower and wash wound with soap and water. - on days that dressing is changed Wound Treatment Wound #1 - Neck Wound Laterality: Right Cleanser: Soap and Water Every Other Day/15 Days Discharge Instructions: May shower and wash wound with dial antibacterial soap and water prior to dressing change. Peri-Wound Care: Skin Prep Every Other Day/15 Days Discharge Instructions: Use skin prep as directed Prim Dressing: Hydrofera  Blue Ready Foam, 4x5 in (DME) (Generic) Every Other Day/15 Days ary Discharge Instructions: Apply to wound bed as instructed Secondary Dressing: Woven Gauze Sponge, Non-Sterile 4x4 in Every Other Day/15 Days Discharge Instructions: Apply over primary dressing as directed. Secondary Dressing: ABD Pad, 5x9 Every Other Day/15 Days Discharge Instructions: Apply over primary dressing as directed. Secured With: 16M Medipore H Soft Cloth Surgical Tape, 2x2 (in/yd) Every Other Day/15 Days Discharge Instructions: Secure dressing with tape as directed. Electronic Signature(s) Signed: 01/14/2021 4:45:43 PM By: Linton Ham MD Signed: 01/14/2021 5:30:45 PM By: Levan Hurst RN, BSN Entered By: Levan Hurst on 01/14/2021 15:47:17 -------------------------------------------------------------------------------- Problem List Details Patient Name: Terri of Service: MA [name], Terri [name] 01/14/2021 3:00 PM Medical Record Number: [id number] Patient Account Number: [id number] Terri of Birth/Sex: Treating RN: 10/17/1950 (70 y.o. 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 Terri [name] Primary Care Provider: [name] Other Clinician: Referring Provider: Treating Provider/Extender: Philomena Doheny, Dario Guardian in Treatment: 1 Subjective History of Present Illness (HPI) and Impression 11/30/20 CLINICAL DATA: Head and neck cancer. Assess treatment response. Tongue cancer. EXAM: CT NECK WITH CONTRAST TECHNIQUE: Multidetector CT imaging of the neck was performed using the standard protocol following the bolus administration of intravenous contrast. CONTRAST 10mL OMNIPAQUE IOHEXOL 300 MG/ML SOLN : COMPARISON: CT neck 09/18/2020. PET CT 08/27/2020 FINDINGS: Pharynx and larynx: Large area of necrotic tumor in the region of the right tongue base has progressed significantly since the prior study. This mass measures approximately 3.6 x 4.0 cm. There is mass-effect on the airway which remains patent. There is a large area of soft tissue mass in the left lateral tongue of uncertain etiology. This shows diffuse increase metabolism on PET and could be inflammatory or tumor. Recommend physical exam of this area. This area measures  approximately 5.3 x 2.3 cm. This has progressed since the prior CT . Vocal cords normal. Airway intact. Salivary glands: Atrophic left submandibular gland. Right submandibular gland appears encased by tumor. Negative parotid bilaterally. Thyroid: Small 5 mm thyroid nodules bilaterally. No further imaging necessary. Lymph nodes: Large necrotic submandibular lymph node mass on the right. This is ulcerated to the skin surface and shows marked progression from the prior CT This is encases the right . submandibular gland. This mass measures approximately 6 x 7.8 cm. No enlarged lymph nodes in the left neck. Vascular: Normal vascular enhancement. Right jugular vein is encased and narrowed by tumor but is patent. Limited intracranial: Negative Visualized orbits: Negative Mastoids and visualized paranasal sinuses: Paranasal sinuses clear. Mastoid clear. Skeleton: Erosive changes of the inferior margin of the body of the mandible on the right compatible with tumor invasion. This has progressed since the prior CT No other bony metastasis identified. [name] Upper chest: Chest CT today reported separately Other: None IMPRESSION: 1. Marked progression of necrotic  mass in the base of the tongue measuring approximately 3.6 x 4.0 cm. There is significant mass-effect on the airway which is patent. 2. There is marked enlargement of necrotic and ulcerated mass in the right submandibular region compatible with lymph node progression. This mass measures 6 x 7.8 cm. 3. Large area of mass-effect in the left lateral tongue of uncertain etiology. This was hypermetabolic on PET and could be inflammatory or due to tumor. Correlate with physical exam. 4. Progressive bony destruction of the mandible compatible with metastatic disease. 08/27/20 CLINICAL DATA: Subsequent treatment strategy for head neck cancer, status post tongue resection, chemotherapy, and radiation. EXAM: NUCLEAR MEDICINE PET SKULL BASE TO  THIGH TECHNIQUE: 9.6 mCi F-18 FDG was injected intravenously. Full-ring PET imaging was performed from the skull base to thigh after the radiotracer. CT data was obtained and used for attenuation correction and anatomic localization. Fasting blood glucose: 70 mg/dl COMPARISON: CT neck dated 06/08/2020. PET-CT dated 05/21/2020. FINDINGS: Mediastinal blood pool activity: SUV max 3.1 Liver activity: SUV max NA NECK: 19 mm short axis right submandibular node (series 3/image 93), max SUV 12.0, previously 11.5. Asymmetric hypermetabolism involving the left anterior tongue in this patient status post right partial glossectomy, max SUV 14.1. This is favored to be physiologic/reactive rather than indicative of underlying tumor. Incidental CT findings: none CHEST 7 mm short axis anterior mediastinal/prevascular node (series : 3/image 118), max SUV 4.1. Otherwise, no suspicious thoracic lymphadenopathy. No hypermetabolic pulmonary nodules. Mild hypermetabolism along the right lateral chest wall, max SUV 4.2, likely physiologic/reactive. Incidental CT findings: Left chest port terminates at the cavoatrial junction. Mild atherosclerotic calcifications of the aortic arch. Mild eventration of left hemidiaphragm. ABDOMEN/PELVIS: No abnormal hypermetabolism involving the liver, spleen, pancreas, or adrenal glands. No hypermetabolic abdominopelvic lymphadenopathy. Incidental CT findings: Mild atherosclerotic calcifications of the aortic arch. SKELETON: No focal hypermetabolic activity to suggest skeletal metastasis. Incidental CT findings: Mild degenerative changes of the visualized thoracolumbar spine. IMPRESSION: Hypermetabolic right submandibular nodal metastasis, as above. Suspected new left anterior mediastinal/prevascular node. Attention on follow-up is suggested. Asymmetric hypermetabolism involving the left anterior tongue is favored to be physiologic/reactive in this patient status  post right partial glossectomy. Electronically Signed By: Julian Hy M.D. On: 08/28/2020 11:34 ADMISSION 01/07/2021 This is an unfortunate 70 year old non-smoking woman. 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 She underwent a right partial glossectomy and neck dissection on 05/13/2019. More recently she is received chemotherapy in December 2021 through January 2021. She had a course of radiation for an expanding mass in her right tongue metastatic to the underlying mandible. She is followed by Dr. Learta Codding Dr Lanell Persons of medical and radiation oncology respectively. I was called by Dr. Lanell Persons last week. She had a very large mass at 1 point which started in October as a small pimple-like lesion and then gradually expanded. Fortunately the radiation was able to get this to contract although she is been left with a large wound on her right upper neck underneath the mandible. There is a small area of exposed mandible. Dr. Lanell Persons was wondering whether anything can be done from a wound care point of view. The patient states that she was offered additional radiation by Dr. Lanell Persons and Dr. Lanell Persons also told me this although the patient is not ready for any additional radiation. She has refused a feeding tube but is having increasing difficulty eating. Past medical history includes hypertension, osteoarthritis. Mostly dominated by metastatic squamous cell CA of the tongue  including mets to the chest. Her last CT scan was in February on February 25/22. This showed marked progression of the necrotic mass in the base of her tongue measuring 3.6 x 4 cm there was masslike effect on the airway. There was marked enlargement and necrotic ulcerated mass in the right submandibular region compatible with lymph node progression the mass at the time measured 6 x 7.8. There is also a large mass area on the left lateral tongue of indeterminant etiology either tumor  or inflammatory. The patient lives in [name] but wishes to come and stay within the [facility] health system. She has only been using a dry dressing to this area. As mentioned she was able to show me a picture on her cell phone of the large protruding mass from her lower jaw but it that is shrunk dramatically since she had radiation. Her palliative radiation was from 12/04/2020 through 12/06/2020 in 4 fractions 4/11; wound is measuring slightly smaller although I do not see a lot of difference. There is probably malignancy around the margins towards her tip of her mandible. I cleaned this off last time very thick necrotic surface and I have done the same today although I am not really sure how many more times I will do this. There are impressive pictures of the effect of radiation on this tumor this was a large fungating tumor that responded rapidly to radiation. For this reason I have been giving her an attempted aggressive dressing changes Objective Constitutional Sitting or standing Blood Pressure is within target range for patient.. Pulse regular and within target range for patient.[name] Respirations regular, non-labored and within target range.. Temperature is normal and within the target range for the patient.[name] Appears in no distress. Vitals Time Taken: 3:14 PM, Temperature: 97.7 F, Pulse: 106 bpm, Respiratory Rate: 17 breaths/min, Blood Pressure: 125/73 mmHg. General Notes: Wound exam; less adherent debris than when I first saw this but still requiring a debridement with an open curette. I am able to get the red help healthy looking tissue although she still I think has tumor around the margins and there is some parts of this that has exposed mandible. Integumentary (Hair, Skin) Wound #1 status is Open. Original cause of wound was Radiation Burn. The Terri acquired was: 07/06/2020. The wound has been in treatment 1 weeks. The wound is located on the Right Neck. The wound measures 4.5cm length x  6.7cm width x 1.2cm depth; 23.68cm^2 area and 28.416cm^3 volume. There is no tunneling or undermining noted. There is a medium amount of serosanguineous drainage noted. The wound margin is distinct with the outline attached to the wound base. There is no granulation within the wound bed. There is a large (67-100%) amount of necrotic tissue within the wound bed including Eschar and Adherent Slough. 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 There was a Excisional Skin/Subcutaneous Tissue Debridement with a total area of 30.15 sq cm performed by Ricard Dillon., MD. With the following instrument(s): Curette to remove Viable and Non- Viable tissue/material. Material removed includes Subcutaneous Tissue and Slough and after achieving pain control using Other (Benzocaine 20%). No specimens were taken. A time out was conducted at 15:37, prior to the start of the procedure. A Minimum amount  of bleeding was controlled with Pressure. The procedure was tolerated well with a pain level of 5 throughout and a pain level of 3 following the procedure. Post Debridement Measurements: 4.5cm length x 6.7cm width x 1.2cm depth; 28.416cm^3 volume. Character of Wound/Ulcer Post Debridement requires further debridement. Post procedure Diagnosis Wound #1: Same as Pre-Procedure Plan Follow-up Appointments: Return Appointment in 2 weeks. Bathing/ Shower/ Hygiene: May shower and wash wound with soap and water. - on days that dressing is changed WOUND #1: - Neck Wound Laterality: Right Cleanser: Soap and Water Every Other Day/15 Days Discharge Instructions: May shower and wash wound with dial antibacterial soap and water prior to dressing  change. Peri-Wound Care: Skin Prep Every Other Day/15 Days Discharge Instructions: Use skin prep as directed Prim Dressing: Hydrofera Blue Ready Foam, 4x5 in (DME) (Generic) Every Other Day/15 Days ary Discharge Instructions: Apply to wound bed as instructed Secondary Dressing: Woven Gauze Sponge, Non-Sterile 4x4 in Every Other Day/15 Days Discharge Instructions: Apply over primary dressing as directed. Secondary Dressing: ABD Pad, 5x9 Every Other Day/15 Days Discharge Instructions: Apply over primary dressing as directed. Secured With: 29M Medipore H Soft Cloth Surgical T ape, 2x2 (in/yd) Every Other Day/15 Days Discharge Instructions: Secure dressing with tape as directed. 1. Continue the Hydrofera Blue 2. I am not sure how much longer I will continue to do this. She has had radiation and part of this surface of this wound could very easily be tumor. 3. I told her I would keep this up for about 6 weeks and see if we can get any degree of closure to this. A palliative dressing with silver alginate would be the alternative Electronic Signature(s) Signed: 01/14/2021 4:45:43 PM By: Linton Ham MD Entered By: Linton Ham on 01/14/2021 15:51:35 -------------------------------------------------------------------------------- SuperBill Details Patient Name: Terri of Service: [name] 01/14/2021 Medical Record Number: [id number] Patient Account Number: [id number] Terri of Birth/Sex: Treating RN: Jan 04, 1951 (70 y.o. 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 Modifier: Quantity: 1 CPT4 Code: [id number] Description: 49826 - DEB SUBQ TISS EA ADDL 20CM ICD-10 Diagnosis Description 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

## 2021-01-16 NOTE — Progress Notes (Signed)
Terri, [name] ([id number]) Visit Report for 01/14/2021 Arrival Information Details Patient Name: Date of Service: Terri [name], MCCLORY 01/14/2021 3:00 PM Medical Record Number: [id number] Patient Account Number: [id number] Date of Birth/Sex: Treating RN: 24-Feb-1951 (70 y.o. Tonita [name], [name] Adelard Sanon: [name] Other Clinician: Referring Terri [name]: Treating Ivry Pigue/Extender: Philomena Doheny, Dario Guardian in Treatment: 1 Visit Information History Since Last Visit Added or deleted any medications: No Patient Arrived: Ambulatory Any new allergies or adverse reactions: No Arrival Time: 15:14 Had a fall or experienced change in No Accompanied By: sister activities of daily living that may affect Transfer Assistance: None risk of falls: Patient Identification Verified: Yes Signs or symptoms of abuse/neglect since last visito No Secondary Verification Process Completed: Yes Hospitalized since last visit: No Patient Requires Transmission-Based Precautions: No Implantable device outside of the clinic excluding No Patient Has Alerts: No cellular tissue based products placed in the center since last visit: Has Dressing in Place as Prescribed: Yes Pain Present Now: No Electronic Signature(s) Signed: 01/14/2021 5:44:40 PM By: Rhae Hammock RN Entered By: Rhae Hammock on 01/14/2021 15:14:53 -------------------------------------------------------------------------------- Encounter Discharge Information Details Patient Name: Date of Service: MA Terri [name], Terri [name] 01/14/2021 3:00 PM Medical Record Number: [id number] Patient Account Number: [id number] Date of Birth/Sex: Treating RN: May 31, 1951 (70 y.o. Terri [name] Primary Care Terri [name]: [name] Other Clinician: Referring Chauncy Mangiaracina: Treating Donise Woodle/Extender: Philomena Doheny, Dario Guardian in Treatment: 1 Encounter Discharge Information Items Post Procedure Vitals Discharge Condition:  Stable Temperature (F): 97.7 Ambulatory Status: Ambulatory Pulse (bpm): 106 Discharge Destination: Home Respiratory Rate (breaths/min): 17 Transportation: Private Auto Blood Pressure (mmHg): 125/73 Accompanied By: family member Schedule Follow-up Appointment: Yes Clinical Summary of Care: Electronic Signature(s) Signed: 01/14/2021 5:44:53 PM By: Deon Pilling Entered By: Deon Pilling on 01/14/2021 17:43:48 -------------------------------------------------------------------------------- Lower Extremity Assessment Details Patient Name: Date of Service: Terri [name], Terri [name] 01/14/2021 3:00 PM Medical Record Number: [id number] Patient Account Number: [id number] Date of Birth/Sex: Treating RN: Mar 02, 1951 (70 y.o. Tonita [name], [name] [name]: [name] Other Clinician: Referring Zalen Sequeira: Treating Terri [name]/Extender: Philomena Doheny, Dario Guardian in Treatment: 1 Electronic Signature(s) Signed: 01/14/2021 5:44:40 PM By: Rhae Hammock RN Entered By: Rhae Hammock on 01/14/2021 15:20:44 -------------------------------------------------------------------------------- Multi Wound Chart Details Patient Name: Date of Service: Terri [name] Con[place], Terri [name] 01/14/2021 3:00 PM Medical Record Number: [id number] Patient Account Number: [id number] Date of Birth/Sex: Treating RN: 1951-02-20 (70 y.o. 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] [N/A:N/A] Primary Etiology: [1:Coronary Artery Disease,] [N/A:N/A] Comorbid History: [1:Hypertension,  Osteoarthritis, Received Radiation 07/06/2020] [N/A:N/A] Date Acquired: [1:1] [N/A:N/A] [name] of Treatment: [1:Open] [N/A:N/A] Wound Status: [1:4.5x6.7x1.2] [N/A:N/A] Measurements L x W x D (cm) [1:23.68] [N/A:N/A] A (cm) : rea [1:28.416] [N/A:N/A] Volume (cm) : [1:-7.70%] [N/A:N/A] % Reduction in A [1:rea: 32.00%] [N/A:N/A] % Reduction in Volume: [1:Full Thickness Without Exposed] [N/A:N/A] Classification: [1:Support Structures Medium] [N/A:N/A] Exudate A mount: [1:Serosanguineous] [N/A:N/A] Exudate Type: [1:red, brown] [N/A:N/A] Exudate Color: [1:Distinct, outline attached] [N/A:N/A] Wound Margin: [1:None Present (0%)] [N/A:N/A] Granulation A mount: [1:Large (67-100%)] [N/A:N/A] Necrotic A mount: [1:Eschar, Adherent Slough] [N/A:N/A] Necrotic Tissue: [1:Fascia: No] [N/A:N/A] Exposed Structures: [1:Fat Layer (Subcutaneous Tissue): No Tendon: No Muscle: No Joint: No Bone: No None] [N/A:N/A] Epithelialization: [1:Debridement - Excisional] [N/A:N/A] Debridement: Pre-procedure Verification/Time Out 15:37 [N/A:N/A]  Taken: [1:Other] [N/A:N/A] Pain Control: [1:Subcutaneous, Slough] [N/A:N/A] Tissue Debrided: [1:Skin/Subcutaneous Tissue] [N/A:N/A] Level: [1:30.15] [N/A:N/A] Debridement A (sq cm): [1:rea Curette] [N/A:N/A] Instrument: [1:Minimum] [N/A:N/A] Bleeding: [1:Pressure] [N/A:N/A] Hemostasis A chieved: [1:5] [N/A:N/A] Procedural Pain: [1:3] [N/A:N/A] Post Procedural Pain: [1:Procedure was tolerated well] [N/A:N/A] Debridement Treatment Response: [1:4.5x6.7x1.2] [N/A:N/A] Post Debridement Measurements L x W x D (cm) [1:28.416] [N/A:N/A] Post Debridement Volume: (cm) [1:Debridement] [N/A:N/A] Treatment Notes Electronic Signature(s) Signed: 01/14/2021 4:45:43 PM By: Linton Ham MD Signed: 01/14/2021 5:30:45 PM By: Levan Hurst RN, BSN Entered By: Linton Ham on 01/14/2021  15:48:37 -------------------------------------------------------------------------------- Multi-Disciplinary Care Plan Details Patient Name: Date of Service: Terri [name] Constable, Terri [name] 01/14/2021 3:00 PM Medical Record Number: [id number] Patient Account Number: [id number] Date of Birth/Sex: Treating RN: 02-13-1951 (70 y.o. Terri [name] Primary Care Lael Pilch: [name] Other Clinician: Referring Velvie Thomaston: Treating Redding Cloe/Extender: Philomena Doheny, Dario Guardian in Treatment: 1 [place] reviewed with physician Active Inactive Malignancy/Atypical Etiology Nursing Diagnoses: Knowledge deficit related to disease process and management of malignancy Goals: Patient/caregiver will verbalize understanding of disease process and disease management of malignancy Date Initiated: 01/07/2021 Target Resolution Date: 02/08/2021 Goal Status: Active Interventions: Assess patient and family medical history for signs and symptoms of malignancy/atypical etiology upon admission Provide education on malignant ulcerations Notes: Nutrition Nursing Diagnoses: Potential for alteratiion in Nutrition/Potential for imbalanced nutrition Goals: Patient/caregiver agrees to and verbalizes understanding of need to use nutritional supplements and/or vitamins as prescribed Date Initiated: 01/07/2021 Target Resolution Date: 02/08/2021 Goal Status: Active Interventions: Assess patient nutrition upon admission and as needed per policy Provide education on nutrition Treatment Activities: Education provided on Nutrition : 01/07/2021 Notes: Wound/Skin Impairment Nursing Diagnoses: Impaired tissue integrity Knowledge deficit related to ulceration/compromised skin integrity Goals: Patient/caregiver will verbalize understanding of skin care regimen Date Initiated: 01/07/2021 Target Resolution Date: 02/08/2021 Goal Status: Active Interventions: Assess patient/caregiver ability to obtain necessary  supplies Assess patient/caregiver ability to perform ulcer/skin care regimen upon admission and as needed Assess ulceration(s) every visit Provide education on ulcer and skin care Notes: Electronic Signature(s) Signed: 01/14/2021 5:30:45 PM By: Levan Hurst RN, BSN Entered By: Levan Hurst on 01/14/2021 15:42:37 -------------------------------------------------------------------------------- Pain Assessment Details Patient Name: Date of Service: MA BREKYN, HUNTOON 01/14/2021 3:00 PM Medical Record Number: [id number] Patient Account Number: [id number] Date of Birth/Sex: Treating RN: [birth year] (70 y.o. Tonita [name], [name] Zigmund Linse: [name] Other Clinician: Referring Caryl Manas: Treating Keylon Labelle/Extender: Philomena Doheny, Terri [name] in Treatment: 1 Active Problems Location of Pain Severity and Description of Pain Patient Has Paino No Site Locations Pain Management and Medication Current Pain Management: Electronic Signature(s) Signed: 01/14/2021 5:44:40 PM By: Rhae Hammock RN Entered By: Rhae Hammock on 01/14/2021 15:20:38 -------------------------------------------------------------------------------- Patient/Caregiver Education Details Patient Name: Date of Service: MA Terri [name], Terri [name] 4/11/2022andnbsp3:00 PM Medical Record Number: [id number] Patient Account Number: [id number] Date of Birth/Gender: Treating RN: [birth year] (69 y.o. Terri [name] Primary Care Physician: [name] Other Clinician: Referring Physician: Treating Physician/Extender: Philomena Doheny, Dario Guardian in Treatment: 1 Education Assessment Education Provided To: Patient Education Topics Provided Wound/Skin Impairment: Methods: Explain/Verbal Responses: State content correctly Electronic Signature(s) Signed: 01/14/2021 5:30:45 PM By: Levan Hurst RN, BSN Entered By: Levan Hurst on 01/14/2021  15:42:46 -------------------------------------------------------------------------------- Wound Assessment Details Patient Name: Date of Service: MA PRECIOSA, Terri [name] 01/14/2021 3:00 PM Medical Record Number: [id number] Patient Account Number: [id number] Date of Birth/Sex: Treating RN: 1951/05/09 (70 y.o. 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 Wound Status: Open Wounding Event: Radiation Burn Comorbid Coronary Artery Disease, Hypertension, Osteoarthritis, Date Acquired: 07/06/2020 History: Received Radiation [name] Of Treatment: 1 Clustered Wound: No Photos Wound Measurements Length: (cm) 4.5 Width: (cm) 6.7 Depth: (cm) 1.2 Area: (cm) 23.68 Volume: (cm) 28.416 % Reduction in Area: -7.7% % Reduction in Volume: 32% Epithelialization: None Tunneling: No Undermining: No Wound Description Classification: Full Thickness Without Exposed Support Structu Wound Margin: Distinct, outline attached Exudate Amount: Medium Exudate Type: Serosanguineous Exudate Color: red, brown res Foul Odor After Cleansing: No Slough/Fibrino Yes Wound Bed Granulation Amount: None Present (0%) Exposed Structure Necrotic Amount: Large (67-100%) Fascia Exposed: No Necrotic Quality: Eschar, Adherent Slough Fat Layer (Subcutaneous Tissue) Exposed: No Tendon Exposed: No Muscle Exposed: No Joint Exposed: No Bone Exposed: No Treatment Notes Wound #1 (Neck) Wound Laterality: Right Cleanser Soap and Water Discharge Instruction: May shower and wash wound with dial antibacterial soap and water prior to dressing change. Peri-Wound Care Skin Prep Discharge Instruction: Use skin prep as directed Topical Primary Dressing Hydrofera Blue Ready Foam, 4x5 in Discharge  Instruction: Apply to wound bed as instructed Secondary Dressing Woven Gauze Sponge, Non-Sterile 4x4 in Discharge Instruction: Apply over primary dressing as directed. ABD Pad, 5x9 Discharge Instruction: Apply over primary dressing as directed. Secured With 85M [place] Surgical T ape, 2x2 (in/yd) Discharge Instruction: Secure dressing with tape as directed. Compression Wrap Compression Stockings Add-Ons Electronic Signature(s) Signed: 01/14/2021 5:44:40 PM By: Rhae Hammock RN Signed: 01/16/2021 8:29:36 AM By: Sandre Kitty Entered By: Sandre Kitty on 01/14/2021 17:10:11 -------------------------------------------------------------------------------- Vitals Details Patient Name: Date of Service: MA SSIE, Terri [name] 01/14/2021 3:00 PM Medical Record Number: [id number] Patient Account Number: [id number] Date of Birth/Sex: Treating RN: 1950-10-13 (70 y.o. Tonita [name], [name] Darline Faith: [name] Other Clinician: Referring Silvester Reierson: Treating Mehar Sagen/Extender: Philomena Doheny, Terri [name] in Treatment: 1 Vital Signs Time Taken: 15:14 Temperature (F): 97.7 Pulse (bpm): 106 Respiratory Rate (breaths/min): 17 Blood Pressure (mmHg): 125/73 Reference Range: 80 - 120 mg / dl Electronic Signature(s) Signed: 01/14/2021 5:44:40 PM By: Rhae Hammock RN Entered By: Rhae Hammock on 01/14/2021 15:16:52

## 2021-01-17 ENCOUNTER — Other Ambulatory Visit: Payer: Self-pay

## 2021-01-17 ENCOUNTER — Inpatient Hospital Stay: Payer: BC Managed Care – PPO | Attending: Nurse Practitioner

## 2021-01-17 DIAGNOSIS — C021 Malignant neoplasm of border of tongue: Secondary | ICD-10-CM | POA: Diagnosis present

## 2021-01-17 LAB — CMP (CANCER CENTER ONLY)
ALT: 12 U/L (ref 0–44)
AST: 13 U/L — ABNORMAL LOW (ref 15–41)
Albumin: 3.8 g/dL (ref 3.5–5.0)
Alkaline Phosphatase: 78 U/L (ref 38–126)
Anion gap: 6 (ref 5–15)
BUN: 18 mg/dL (ref 8–23)
CO2: 29 mmol/L (ref 22–32)
Calcium: 10 mg/dL (ref 8.9–10.3)
Chloride: 99 mmol/L (ref 98–111)
Creatinine: 0.65 mg/dL (ref 0.44–1.00)
GFR, Estimated: >60 mL/min (ref >60)
Glucose, Bld: 103 mg/dL — ABNORMAL HIGH (ref 70–99)
Potassium: 4.1 mmol/L (ref 3.5–5.1)
Sodium: 134 mmol/L — ABNORMAL LOW (ref 135–145)
Total Bilirubin: 0.4 mg/dL (ref 0.3–1.2)
Total Protein: 7.7 g/dL (ref 6.5–8.1)

## 2021-01-23 NOTE — Progress Notes (Signed)
  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 She received the quad shot method of palliative treatment, intended to be given BID x 2 days although the treatment ended up elapsing over three days.  Plan: The patient will follow-up with radiation oncology in 34mo . -----------------------------------  Eppie Gibson, MD

## 2021-01-28 ENCOUNTER — Inpatient Hospital Stay: Payer: BC Managed Care – PPO | Admitting: Nutrition

## 2021-01-28 ENCOUNTER — Encounter (HOSPITAL_BASED_OUTPATIENT_CLINIC_OR_DEPARTMENT_OTHER): Payer: BC Managed Care – PPO | Admitting: Internal Medicine

## 2021-01-28 ENCOUNTER — Telehealth: Payer: Self-pay | Admitting: Nutrition

## 2021-01-28 NOTE — Telephone Encounter (Signed)
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  Patient has been attending wound clinic and reports she thinks her wound is better.  She said it is still open and draining but she thinks it has improved.  She has not weighed recently.  Last recorded weight was on March 28 when patient weighed 112.8 pounds.  Noted labs on April 14 sodium 134 glucose 103.  Patient continues to eat soft foods such as soup and potatoes with gravy.  She has changed her oral nutrition supplement to boost [facility] which provides 530 cal and 22 g protein per carton.  She consumes between 4 and 5 cartons daily.  She has continued on multivitamin and vitamin C for healing.  She used zinc sulfate for 2 weeks as directed and then discontinued.  Patient denies nausea, vomiting, constipation, and diarrhea.  Estimated nutrition needs: 2040- 2295 cal, 90-105 g protein, greater than 2 L fluid.  Nutrition diagnosis: Inadequate oral intake improved.  Severe malnutrition is improving.  Intervention: Continue 4-5 cartons of boost [facility] daily.  This provides 2120-2650 cal, 88-110 g protein.  Continue soft foods as tolerated. Continue multivitamin plus vitamin C to support healing. Recommended patient contact me with any questions or concerns.  Monitoring, evaluation, goals: Patient will tolerate adequate calories and protein to minimize weight loss and support healing.  Next follow-up to be scheduled by phone in 4-6 weeks.  **Disclaimer: This note was dictated with voice recognition software. Similar sounding words can inadvertently be transcribed and this note may contain transcription errors which may not have been corrected upon publication of note.**

## 2021-01-29 ENCOUNTER — Telehealth: Payer: Self-pay | Admitting: *Deleted

## 2021-01-29 NOTE — Telephone Encounter (Signed)
Called to report she had a "wound rupture" with profuse bleeding last night and was rushed to hospital in [place], [place]. Patient refuses intubation or trach. Wants comfort care. Now being heavily sedated for comfort and have called in Hospice referral. Wanted Dr. Benay Spice, Dr. Isidore Moos and Dr. Dellia Nims aware and appreciates all the care she was provided in the [facility] system.

## 2021-02-06 ENCOUNTER — Telehealth: Payer: Self-pay | Admitting: *Deleted

## 2021-02-06 NOTE — Telephone Encounter (Signed)
Called and left message for son requesting update on his mother's condition. We have been thinking of her.

## 2021-02-06 NOTE — Telephone Encounter (Signed)
Spoke with son, Terri [name] and was informed that she died 12/23/2022 morning. It was peaceful and he was with her. He is very thankful for the wonderful care of Dr. Benay Spice, Dr. Isidore Moos and all staff at [facility]

## 2021-02-08 ENCOUNTER — Ambulatory Visit: Payer: BC Managed Care – PPO | Admitting: Oncology

## 2021-02-08 ENCOUNTER — Ambulatory Visit: Payer: Self-pay | Admitting: Radiation Oncology

## 2021-03-06 DEATH — deceased
